# Patient Record
Sex: Female | Born: 1985 | Race: Black or African American | Hispanic: No | Marital: Single | Smoking: Never smoker
Health system: Southern US, Community
[De-identification: ages and names within clinical notes are randomized; demographics above are authoritative.]

## PROBLEM LIST (undated history)

## (undated) ENCOUNTER — Inpatient Hospital Stay (HOSPITAL_COMMUNITY): Payer: Self-pay

## (undated) ENCOUNTER — Emergency Department (HOSPITAL_COMMUNITY): Admission: EM | Payer: Self-pay

## (undated) DIAGNOSIS — A549 Gonococcal infection, unspecified: Secondary | ICD-10-CM

## (undated) DIAGNOSIS — A749 Chlamydial infection, unspecified: Secondary | ICD-10-CM

## (undated) DIAGNOSIS — I10 Essential (primary) hypertension: Secondary | ICD-10-CM

## (undated) DIAGNOSIS — R87629 Unspecified abnormal cytological findings in specimens from vagina: Secondary | ICD-10-CM

## (undated) DIAGNOSIS — A599 Trichomoniasis, unspecified: Secondary | ICD-10-CM

## (undated) DIAGNOSIS — B999 Unspecified infectious disease: Secondary | ICD-10-CM

## (undated) DIAGNOSIS — O139 Gestational [pregnancy-induced] hypertension without significant proteinuria, unspecified trimester: Secondary | ICD-10-CM

## (undated) HISTORY — PX: OTHER SURGICAL HISTORY: SHX169

## (undated) HISTORY — PX: EYE SURGERY: SHX253

## (undated) HISTORY — PX: DILATION AND CURETTAGE OF UTERUS: SHX78

---

## 2004-01-24 ENCOUNTER — Emergency Department (HOSPITAL_COMMUNITY): Admission: EM | Admit: 2004-01-24 | Discharge: 2004-01-24 | Payer: Self-pay | Admitting: Emergency Medicine

## 2005-06-16 ENCOUNTER — Inpatient Hospital Stay (HOSPITAL_COMMUNITY): Admission: AD | Admit: 2005-06-16 | Discharge: 2005-06-16 | Payer: Self-pay | Admitting: Obstetrics & Gynecology

## 2005-06-25 ENCOUNTER — Inpatient Hospital Stay (HOSPITAL_COMMUNITY): Admission: AD | Admit: 2005-06-25 | Discharge: 2005-06-25 | Payer: Self-pay | Admitting: Obstetrics

## 2005-06-27 ENCOUNTER — Inpatient Hospital Stay (HOSPITAL_COMMUNITY): Admission: AD | Admit: 2005-06-27 | Discharge: 2005-06-27 | Payer: Self-pay | Admitting: Obstetrics & Gynecology

## 2005-06-30 ENCOUNTER — Inpatient Hospital Stay (HOSPITAL_COMMUNITY): Admission: AD | Admit: 2005-06-30 | Discharge: 2005-06-30 | Payer: Self-pay | Admitting: Obstetrics

## 2005-07-03 ENCOUNTER — Inpatient Hospital Stay (HOSPITAL_COMMUNITY): Admission: AD | Admit: 2005-07-03 | Discharge: 2005-07-06 | Payer: Self-pay | Admitting: Obstetrics

## 2007-01-04 ENCOUNTER — Emergency Department (HOSPITAL_COMMUNITY): Admission: EM | Admit: 2007-01-04 | Discharge: 2007-01-04 | Payer: Self-pay | Admitting: Family Medicine

## 2007-09-20 ENCOUNTER — Inpatient Hospital Stay (HOSPITAL_COMMUNITY): Admission: AD | Admit: 2007-09-20 | Discharge: 2007-09-20 | Payer: Self-pay | Admitting: Obstetrics & Gynecology

## 2008-04-27 ENCOUNTER — Inpatient Hospital Stay (HOSPITAL_COMMUNITY): Admission: AD | Admit: 2008-04-27 | Discharge: 2008-04-27 | Payer: Self-pay | Admitting: Obstetrics & Gynecology

## 2008-07-19 ENCOUNTER — Emergency Department (HOSPITAL_COMMUNITY): Admission: EM | Admit: 2008-07-19 | Discharge: 2008-07-20 | Payer: Self-pay | Admitting: Emergency Medicine

## 2009-05-08 ENCOUNTER — Emergency Department (HOSPITAL_COMMUNITY): Admission: EM | Admit: 2009-05-08 | Discharge: 2009-05-08 | Payer: Self-pay | Admitting: Family Medicine

## 2009-07-12 ENCOUNTER — Emergency Department (HOSPITAL_COMMUNITY): Admission: EM | Admit: 2009-07-12 | Discharge: 2009-07-12 | Payer: Self-pay | Admitting: Emergency Medicine

## 2009-09-04 ENCOUNTER — Emergency Department (HOSPITAL_COMMUNITY): Admission: EM | Admit: 2009-09-04 | Discharge: 2009-09-04 | Payer: Self-pay | Admitting: Emergency Medicine

## 2009-11-07 ENCOUNTER — Emergency Department (HOSPITAL_COMMUNITY): Admission: EM | Admit: 2009-11-07 | Discharge: 2009-11-07 | Payer: Self-pay | Admitting: Emergency Medicine

## 2010-01-07 ENCOUNTER — Inpatient Hospital Stay (HOSPITAL_COMMUNITY): Admission: AD | Admit: 2010-01-07 | Discharge: 2010-01-07 | Payer: Self-pay | Admitting: Obstetrics & Gynecology

## 2010-12-24 LAB — URINALYSIS, ROUTINE W REFLEX MICROSCOPIC
Bilirubin Urine: NEGATIVE
Ketones, ur: NEGATIVE mg/dL
Leukocytes, UA: NEGATIVE
Protein, ur: NEGATIVE mg/dL
Urobilinogen, UA: 1 mg/dL (ref 0.0–1.0)

## 2010-12-24 LAB — POCT I-STAT, CHEM 8
Calcium, Ion: 1.1 mmol/L — ABNORMAL LOW (ref 1.12–1.32)
Chloride: 106 mEq/L (ref 96–112)
Creatinine, Ser: 1.1 mg/dL (ref 0.4–1.2)
HCT: 35 % — ABNORMAL LOW (ref 36.0–46.0)
Sodium: 141 mEq/L (ref 135–145)

## 2010-12-24 LAB — URINE MICROSCOPIC-ADD ON

## 2010-12-24 LAB — CBC
HCT: 33.6 % — ABNORMAL LOW (ref 36.0–46.0)
Hemoglobin: 11.4 g/dL — ABNORMAL LOW (ref 12.0–15.0)
MCHC: 32.5 g/dL (ref 30.0–36.0)
MCHC: 34 g/dL (ref 30.0–36.0)
Platelets: 274 10*3/uL (ref 150–400)
RBC: 3.9 MIL/uL (ref 3.87–5.11)
RDW: 13.5 % (ref 11.5–15.5)
RDW: 14 % (ref 11.5–15.5)
WBC: 7.8 10*3/uL (ref 4.0–10.5)

## 2010-12-24 LAB — WET PREP, GENITAL
Clue Cells Wet Prep HPF POC: NONE SEEN
Trich, Wet Prep: NONE SEEN
WBC, Wet Prep HPF POC: NONE SEEN
Yeast Wet Prep HPF POC: NONE SEEN

## 2010-12-24 LAB — DIFFERENTIAL
Eosinophils Absolute: 0.9 10*3/uL — ABNORMAL HIGH (ref 0.0–0.7)
Eosinophils Relative: 12 % — ABNORMAL HIGH (ref 0–5)
Lymphocytes Relative: 41 % (ref 12–46)
Lymphs Abs: 3.2 10*3/uL (ref 0.7–4.0)
Monocytes Absolute: 0.5 10*3/uL (ref 0.1–1.0)
Monocytes Relative: 6 % (ref 3–12)
Neutro Abs: 3.1 10*3/uL (ref 1.7–7.7)
Neutrophils Relative %: 40 % — ABNORMAL LOW (ref 43–77)

## 2010-12-24 LAB — GC/CHLAMYDIA PROBE AMP, GENITAL
Chlamydia, DNA Probe: NEGATIVE
Chlamydia, DNA Probe: NEGATIVE

## 2010-12-24 LAB — POCT PREGNANCY, URINE: Preg Test, Ur: NEGATIVE

## 2010-12-24 LAB — PREGNANCY, URINE: Preg Test, Ur: NEGATIVE

## 2011-01-06 LAB — WET PREP, GENITAL: Yeast Wet Prep HPF POC: NONE SEEN

## 2011-01-06 LAB — URINE CULTURE

## 2011-01-06 LAB — POCT PREGNANCY, URINE: Preg Test, Ur: NEGATIVE

## 2011-01-06 LAB — POCT URINALYSIS DIP (DEVICE)
Protein, ur: 30 mg/dL — AB
Urobilinogen, UA: 2 mg/dL — ABNORMAL HIGH (ref 0.0–1.0)
pH: 6.5 (ref 5.0–8.0)

## 2011-01-06 LAB — GC/CHLAMYDIA PROBE AMP, GENITAL
Chlamydia, DNA Probe: NEGATIVE
GC Probe Amp, Genital: NEGATIVE

## 2011-01-08 LAB — WET PREP, GENITAL
Trich, Wet Prep: NONE SEEN
Yeast Wet Prep HPF POC: NONE SEEN

## 2011-01-08 LAB — GC/CHLAMYDIA PROBE AMP, GENITAL: Chlamydia, DNA Probe: NEGATIVE

## 2011-01-08 LAB — RPR: RPR Ser Ql: NONREACTIVE

## 2011-01-10 LAB — WET PREP, GENITAL: Trich, Wet Prep: NONE SEEN

## 2011-01-10 LAB — POCT PREGNANCY, URINE: Preg Test, Ur: NEGATIVE

## 2011-01-10 LAB — POCT URINALYSIS DIP (DEVICE)
Ketones, ur: NEGATIVE mg/dL
Nitrite: NEGATIVE

## 2011-03-30 ENCOUNTER — Inpatient Hospital Stay (HOSPITAL_COMMUNITY)
Admission: AD | Admit: 2011-03-30 | Discharge: 2011-03-31 | Disposition: A | Discharge status: Home / Self Care | Payer: Self-pay | Source: Ambulatory Visit | Attending: Obstetrics & Gynecology | Admitting: Obstetrics & Gynecology

## 2011-03-30 DIAGNOSIS — I10 Essential (primary) hypertension: Secondary | ICD-10-CM | POA: Insufficient documentation

## 2011-03-30 DIAGNOSIS — R079 Chest pain, unspecified: Secondary | ICD-10-CM | POA: Insufficient documentation

## 2011-03-30 LAB — POCT PREGNANCY, URINE: Preg Test, Ur: NEGATIVE

## 2011-03-31 LAB — CBC
HCT: 37.1 % (ref 36.0–46.0)
Hemoglobin: 12.1 g/dL (ref 12.0–15.0)
MCHC: 32.6 g/dL (ref 30.0–36.0)
RBC: 4.36 MIL/uL (ref 3.87–5.11)

## 2011-05-02 ENCOUNTER — Emergency Department (HOSPITAL_COMMUNITY): Payer: Self-pay

## 2011-05-02 ENCOUNTER — Encounter (HOSPITAL_COMMUNITY): Payer: Self-pay | Admitting: Radiology

## 2011-05-02 ENCOUNTER — Emergency Department (HOSPITAL_COMMUNITY)
Admission: EM | Admit: 2011-05-02 | Discharge: 2011-05-03 | Disposition: A | Discharge status: Home / Self Care | Payer: Self-pay | Attending: Emergency Medicine | Admitting: Emergency Medicine

## 2011-05-02 DIAGNOSIS — R0789 Other chest pain: Secondary | ICD-10-CM | POA: Insufficient documentation

## 2011-05-02 DIAGNOSIS — E669 Obesity, unspecified: Secondary | ICD-10-CM | POA: Insufficient documentation

## 2011-05-02 DIAGNOSIS — I10 Essential (primary) hypertension: Secondary | ICD-10-CM | POA: Insufficient documentation

## 2011-05-02 DIAGNOSIS — J45909 Unspecified asthma, uncomplicated: Secondary | ICD-10-CM | POA: Insufficient documentation

## 2011-05-02 DIAGNOSIS — I498 Other specified cardiac arrhythmias: Secondary | ICD-10-CM | POA: Insufficient documentation

## 2011-05-02 HISTORY — DX: Essential (primary) hypertension: I10

## 2011-05-02 LAB — COMPREHENSIVE METABOLIC PANEL
ALT: 44 U/L — ABNORMAL HIGH (ref 0–35)
AST: 27 U/L (ref 0–37)
Alkaline Phosphatase: 59 U/L (ref 39–117)
CO2: 28 mEq/L (ref 19–32)
Chloride: 103 mEq/L (ref 96–112)
GFR calc Af Amer: >60 mL/min (ref >60)
GFR calc non Af Amer: >60 mL/min (ref >60)
Glucose, Bld: 82 mg/dL (ref 70–99)
Potassium: 4 mEq/L (ref 3.5–5.1)
Sodium: 140 mEq/L (ref 135–145)
Total Bilirubin: 0.4 mg/dL (ref 0.3–1.2)

## 2011-05-02 LAB — DIFFERENTIAL
Basophils Relative: 0 % (ref 0–1)
Lymphs Abs: 3.9 10*3/uL (ref 0.7–4.0)
Monocytes Absolute: 0.7 10*3/uL (ref 0.1–1.0)
Monocytes Relative: 6 % (ref 3–12)
Neutro Abs: 6.5 10*3/uL (ref 1.7–7.7)

## 2011-05-02 LAB — CBC
HCT: 36.3 % (ref 36.0–46.0)
Hemoglobin: 12.3 g/dL (ref 12.0–15.0)
MCH: 27.9 pg (ref 26.0–34.0)
MCHC: 33.9 g/dL (ref 30.0–36.0)
MCV: 82.3 fL (ref 78.0–100.0)
RBC: 4.41 MIL/uL (ref 3.87–5.11)

## 2011-05-02 LAB — CK TOTAL AND CKMB (NOT AT ARMC): Total CK: 257 U/L — ABNORMAL HIGH (ref 7–177)

## 2011-05-02 LAB — TROPONIN I: Troponin I: <0.3 ng/mL (ref <0.30)

## 2011-05-02 MED ORDER — IOHEXOL 300 MG/ML  SOLN
100.0000 mL | Freq: Once | INTRAMUSCULAR | Status: AC | PRN
  Administered 2011-05-02: 100 mL via INTRAVENOUS

## 2011-07-03 LAB — HCG, QUANTITATIVE, PREGNANCY: hCG, Beta Chain, Quant, S: 51714 — ABNORMAL HIGH

## 2011-07-03 LAB — URINALYSIS, ROUTINE W REFLEX MICROSCOPIC
Bilirubin Urine: NEGATIVE
Glucose, UA: NEGATIVE
Protein, ur: NEGATIVE
Urobilinogen, UA: 0.2

## 2011-07-03 LAB — WET PREP, GENITAL: Trich, Wet Prep: NONE SEEN

## 2011-07-03 LAB — POCT PREGNANCY, URINE
Operator id: 208801
Preg Test, Ur: POSITIVE

## 2011-07-03 LAB — URINE CULTURE: Colony Count: 75000

## 2011-07-03 LAB — URINE MICROSCOPIC-ADD ON

## 2011-07-03 LAB — GC/CHLAMYDIA PROBE AMP, GENITAL: Chlamydia, DNA Probe: NEGATIVE

## 2011-07-10 LAB — URINE CULTURE

## 2011-07-10 LAB — CBC
HCT: 34.1 — ABNORMAL LOW
Hemoglobin: 11.6 — ABNORMAL LOW
RBC: 4.07

## 2011-07-10 LAB — POCT PREGNANCY, URINE: Operator id: 242691

## 2011-07-10 LAB — URINALYSIS, ROUTINE W REFLEX MICROSCOPIC
Bilirubin Urine: NEGATIVE
Glucose, UA: NEGATIVE
Ketones, ur: NEGATIVE
Protein, ur: NEGATIVE
pH: 6

## 2011-07-10 LAB — WET PREP, GENITAL

## 2011-07-10 LAB — GC/CHLAMYDIA PROBE AMP, GENITAL
Chlamydia, DNA Probe: NEGATIVE
GC Probe Amp, Genital: NEGATIVE

## 2012-01-26 ENCOUNTER — Encounter (HOSPITAL_COMMUNITY): Payer: Self-pay | Admitting: *Deleted

## 2012-01-26 ENCOUNTER — Inpatient Hospital Stay (HOSPITAL_COMMUNITY)
Admission: AD | Admit: 2012-01-26 | Discharge: 2012-01-26 | Disposition: A | Discharge status: Home / Self Care | Payer: Self-pay | Source: Ambulatory Visit | Attending: Obstetrics & Gynecology | Admitting: Obstetrics & Gynecology

## 2012-01-26 DIAGNOSIS — B9689 Other specified bacterial agents as the cause of diseases classified elsewhere: Secondary | ICD-10-CM | POA: Insufficient documentation

## 2012-01-26 DIAGNOSIS — A499 Bacterial infection, unspecified: Secondary | ICD-10-CM | POA: Insufficient documentation

## 2012-01-26 DIAGNOSIS — N760 Acute vaginitis: Secondary | ICD-10-CM | POA: Insufficient documentation

## 2012-01-26 DIAGNOSIS — I10 Essential (primary) hypertension: Secondary | ICD-10-CM | POA: Insufficient documentation

## 2012-01-26 DIAGNOSIS — N949 Unspecified condition associated with female genital organs and menstrual cycle: Secondary | ICD-10-CM | POA: Insufficient documentation

## 2012-01-26 LAB — WET PREP, GENITAL: Yeast Wet Prep HPF POC: NONE SEEN

## 2012-01-26 MED ORDER — METRONIDAZOLE 500 MG PO TABS: 500.0000 mg | ORAL_TABLET | Freq: Once | ORAL | Status: AC

## 2012-01-26 MED ORDER — HYDROCHLOROTHIAZIDE 25 MG PO TABS: 25.0000 mg | ORAL_TABLET | Freq: Every day | ORAL | Status: DC

## 2012-01-26 MED ORDER — HYDROCHLOROTHIAZIDE 25 MG PO TABS: 12.5000 mg | ORAL_TABLET | Freq: Every day | ORAL | Status: DC

## 2012-01-26 NOTE — MAU Provider Note (Signed)
Emma Contras [place]25 y.o.[id number]  Chief Complaint  Patient presents with  . Vaginal Discharge     None     SUBJECTIVE  HPI: States she had a malodorous white discharge that she thinks is BV, but now still menstruating. Dsicharge does not itch or burn but is more copious than usual. No UTI sx or abd pain.  Denies new sex partner. Wants GC/CT too. Not on contraception. Unasble to afford her HCTZ for [facility] and has no PCP.   Past Medical History  Diagnosis Date  . Hypertension   . Asthma    Past Surgical History  Procedure Date  . Dilation and curettage of uterus    History   Social History  . Marital Status: Single    Spouse Name: N/A    Number of Children: N/A  . Years of Education: N/A   Occupational History  . Not on file.   Social History Main Topics  . Smoking status: Never Smoker   . Smokeless tobacco: Not on file  . Alcohol Use: No  . Drug Use: No  . Sexually Active: Yes    Birth Control/ Protection: None   Other Topics Concern  . Not on file   Social History Narrative  . No narrative on file   No current facility-administered medications on file prior to encounter.   No current outpatient prescriptions on file prior to encounter.   No Known Allergies  ROS: Pertinent items in HPI  OBJECTIVE Blood pressure 149/103, pulse 85, temperature 97.9 F (36.6 C), temperature source Oral, resp. rate 16, height 5\' 5"  (1.651 m), weight 107.502 kg (237 lb), last menstrual period 01/23/2012, SpO2 100.00%. GENERAL: Well-developed, well-nourished female in no acute distress.  ABDOMEN: Soft, nontender EXTREMITIES: Nontender, no edema SPECULUM EXAM: NEFG, scant bloody discharge, cervix slightly red BIMANUAL: cervix no CMT; uterus NSSP; no adnexal tenderness or masses   LAB RESULTS No results found for this or any previous visit (from the past 24 hour(s)). Results for orders placed during the hospital encounter of 01/26/12 (from the past 24 hour(s))  WET PREP,  GENITAL     Status: Abnormal   Collection Time   01/26/12  1:07 PM      Component Value Range   Yeast Wet Prep HPF POC NONE SEEN  NONE SEEN    Trich, Wet Prep NONE SEEN  NONE SEEN    Clue Cells Wet Prep HPF POC FEW (*) NONE SEEN    WBC, Wet Prep HPF POC FEW (*) NONE SEEN    IMAGING   ASSESSMENT BV CHTN Needs contraception  PLAN Rx Flagyl and refilled her HCTZ. Refer to [facility] or [facility] asap for HTN management and to start contraception     Khallid Pasillas 01/26/2012 12:58 PM

## 2012-01-26 NOTE — Discharge Instructions (Signed)
Bacterial Vaginosis Bacterial vaginosis is an infection of the vagina. A healthy vagina has many kinds of good germs (bacteria). Sometimes the number of good germs can change. This allows bad germs to move in and cause an infection. You may be given medicine (antibiotics) to treat the infection. Or, you may not need treatment at all. HOME CARE  Take your medicine as told. Finish them even if you start to feel better.   Do not have sex until you finish your medicine.   Do not douche.   Practice safe sex.   Tell your sex partner that you have an infection. They should see their doctor for treatment if they have problems.  GET HELP RIGHT AWAY IF:  You do not get better after 3 days of treatment.   You have grey fluid (discharge) coming from your vagina.   You have pain.   You have a temperature of 102 F (38.9 C) or higher.  MAKE SURE YOU:   Understand these instructions.   Will watch your condition.   Will get help right away if you are not doing well or get worse.  Document Released: 06/30/2008 Document Revised: 09/10/2011 Document Reviewed: 06/30/2008 [employer] Patient Information 2012 [employer]. 

## 2012-01-26 NOTE — MAU Note (Signed)
Pt in c/o vaginal discharge with an abnormal odor.  Discharge started last week.  Believes it is bacterial vaginosis. Denies any pain.  Is currently on menstrual cycle.

## 2012-01-26 NOTE — MAU Note (Signed)
Patient states she has had a vaginal discharge with a slight odor for about one week. Has recurrent BV. No pain, currently on cycle with slight bleeding.

## 2012-03-19 ENCOUNTER — Emergency Department (HOSPITAL_COMMUNITY)
Admission: EM | Admit: 2012-03-19 | Discharge: 2012-03-19 | Disposition: A | Discharge status: Home / Self Care | Payer: Self-pay | Source: Home / Self Care | Attending: Emergency Medicine | Admitting: Emergency Medicine

## 2012-03-19 ENCOUNTER — Encounter (HOSPITAL_COMMUNITY): Payer: Self-pay | Admitting: *Deleted

## 2012-03-19 DIAGNOSIS — A499 Bacterial infection, unspecified: Secondary | ICD-10-CM

## 2012-03-19 DIAGNOSIS — B9689 Other specified bacterial agents as the cause of diseases classified elsewhere: Secondary | ICD-10-CM

## 2012-03-19 DIAGNOSIS — N760 Acute vaginitis: Secondary | ICD-10-CM

## 2012-03-19 DIAGNOSIS — Z3480 Encounter for supervision of other normal pregnancy, unspecified trimester: Secondary | ICD-10-CM

## 2012-03-19 LAB — POCT URINALYSIS DIP (DEVICE)
Ketones, ur: NEGATIVE mg/dL
Leukocytes, UA: NEGATIVE
Nitrite: NEGATIVE
Protein, ur: NEGATIVE mg/dL
Urobilinogen, UA: 0.2 mg/dL (ref 0.0–1.0)
pH: 6 (ref 5.0–8.0)

## 2012-03-19 LAB — WET PREP, GENITAL
Trich, Wet Prep: NONE SEEN
Yeast Wet Prep HPF POC: NONE SEEN

## 2012-03-19 MED ORDER — PRENATAL VITAMINS 28-0.8 MG PO TABS: 1.0000 | ORAL_TABLET | Freq: Every day | ORAL | Status: DC

## 2012-03-19 MED ORDER — METRONIDAZOLE 500 MG PO TABS: 500.0000 mg | ORAL_TABLET | Freq: Two times a day (BID) | ORAL | Status: AC

## 2012-03-19 NOTE — ED Provider Notes (Signed)
Chief Complaint  Patient presents with  . Vaginitis  . Dysuria    History of Present Illness:   The patient is a 26 year old female who has had a six-day history of a white discharge and older. She denies any itching or irritation. She has no vulvar, vaginal, or pelvic pain. She denies any fever, chills, nausea, or vomiting. She has somewhat irregular menses. Last menstrual period was May 9. She is sexually active and does not use any birth control. She has no symptoms of early pregnancy including morning sickness, breast swelling, or tenderness. She does have a history of bacterial vaginosis.  Review of Systems:  Other than noted above, the patient denies any of the following symptoms: Systemic:  No fever, chills, sweats, fatigue, or weight loss. GI:  No abdominal pain, nausea, anorexia, vomiting, diarrhea, constipation, melena or hematochezia. GU:  No dysuria, frequency, urgency, hematuria, vaginal discharge, itching, or abnormal vaginal bleeding. Skin:  No rash or itching.   PMFSH:  Past medical history, family history, social history, meds, and allergies were reviewed.  Physical Exam:   Vital signs:  BP 145/95  Pulse 98  Temp 98.5 F (36.9 C) (Oral)  Resp 18  SpO2 100%  LMP 02/11/2012 General:  Alert, oriented and in no distress. Lungs:  Breath sounds clear and equal bilaterally.  No wheezes, rales or rhonchi. Heart:  Regular rhythm.  No gallops or murmers. Abdomen:  Soft, flat and non-distended.  No organomegaly or mass.  No tenderness, guarding or rebound.  Bowel sounds normally active. Pelvic exam:  Normal external genitalia, vaginal mucosa was normal. There was a moderate amount of white discharge. Cervix appears normal. Uterus is mid position, normal in size and shape and nontender. No adnexal masses or tenderness. Skin:  Clear, warm and dry.  Labs:   Results for orders placed during the hospital encounter of 03/19/12  WET PREP, GENITAL      Component Value Range   Yeast  Wet Prep HPF POC NONE SEEN  NONE SEEN   Trich, Wet Prep NONE SEEN  NONE SEEN   Clue Cells Wet Prep HPF POC MANY (*) NONE SEEN   WBC, Wet Prep HPF POC MANY (*) NONE SEEN  POCT URINALYSIS DIP (DEVICE)      Component Value Range   Glucose, UA NEGATIVE  NEGATIVE mg/dL   Bilirubin Urine NEGATIVE  NEGATIVE   Ketones, ur NEGATIVE  NEGATIVE mg/dL   Specific Gravity, Urine 1.025  1.005 - 1.030   Hgb urine dipstick TRACE (*) NEGATIVE   pH 6.0  5.0 - 8.0   Protein, ur NEGATIVE  NEGATIVE mg/dL   Urobilinogen, UA 0.2  0.0 - 1.0 mg/dL   Nitrite NEGATIVE  NEGATIVE   Leukocytes, UA NEGATIVE  NEGATIVE  POCT PREGNANCY, URINE      Component Value Range   Preg Test, Ur POSITIVE (*) NEGATIVE     Assessment:  The primary encounter diagnosis was Bacterial vaginosis. A diagnosis of Normal pregnancy, repeat was also pertinent to this visit.  Plan:   1.  The following meds were prescribed:   New Prescriptions   METRONIDAZOLE (FLAGYL) 500 MG TABLET    Take 1 tablet (500 mg total) by mouth 2 (two) times daily.   PRENATAL VIT-FE FUMARATE-FA (PRENATAL VITAMINS) 28-0.8 MG TABS    Take 1 tablet by mouth daily.   2.  The patient was instructed in symptomatic care and handouts were given. 3.  The patient was told to return if becoming worse in any  way, if no better in 3 or 4 days, and given some red flag symptoms that would indicate earlier return.  Follow up:  The patient was told to follow up at [facility] hospital clinics for routine prenatal care. The patient was warned not to use alcohol or any other nonprescription medications for right now.    Reuben Likes, MD 03/19/12 [phone]

## 2012-03-19 NOTE — Discharge Instructions (Signed)
To restore the normal balance of "good bacteria" in your system.  Take a probiotic once daily.  These can be gotten over the counter at the drug store without a prescription and come under various brand names such as Culturelle, Align, Florastore, and Phillips.  The best thing to do is to ask your pharmacist to recommend a good probiotic that is not too expensive.    Bacterial Vaginosis Bacterial vaginosis (BV) is a vaginal infection where the normal balance of bacteria in the vagina is disrupted. The normal balance is then replaced by an overgrowth of certain bacteria. There are several different kinds of bacteria that can cause BV. BV is the most common vaginal infection in women of childbearing age. CAUSES   The cause of BV is not fully understood. BV develops when there is an increase or imbalance of harmful bacteria.   Some activities or behaviors can upset the normal balance of bacteria in the vagina and put women at increased risk including:   Having a new sex partner or multiple sex partners.   Douching.   Using an intrauterine device (IUD) for contraception.   It is not clear what role sexual activity plays in the development of BV. However, women that have never had sexual intercourse are rarely infected with BV.  Women do not get BV from toilet seats, bedding, swimming pools or from touching objects around them.  SYMPTOMS   Grey vaginal discharge.   A fish-like odor with discharge, especially after sexual intercourse.   Itching or burning of the vagina and vulva.   Burning or pain with urination.   Some women have no signs or symptoms at all.  DIAGNOSIS  Your caregiver must examine the vagina for signs of BV. Your caregiver will perform lab tests and look at the sample of vaginal fluid through a microscope. They will look for bacteria and abnormal cells (clue cells), a pH test higher than 4.5, and a positive amine test all associated with BV.  RISKS AND COMPLICATIONS    Pelvic inflammatory disease (PID).   Infections following gynecology surgery.   Developing HIV.   Developing herpes virus.  TREATMENT  Sometimes BV will clear up without treatment. However, all women with symptoms of BV should be treated to avoid complications, especially if gynecology surgery is planned. Female partners generally do not need to be treated. However, BV may spread between female sex partners so treatment is helpful in preventing a recurrence of BV.   BV may be treated with antibiotics. The antibiotics come in either pill or vaginal cream forms. Either can be used with nonpregnant or pregnant women, but the recommended dosages differ. These antibiotics are not harmful to the baby.   BV can recur after treatment. If this happens, a second round of antibiotics will often be prescribed.   Treatment is important for pregnant women. If not treated, BV can cause a premature delivery, especially for a pregnant woman who had a premature birth in the past. All pregnant women who have symptoms of BV should be checked and treated.   For chronic reoccurrence of BV, treatment with a type of prescribed gel vaginally twice a week is helpful.  HOME CARE INSTRUCTIONS   Finish all medication as directed by your caregiver.   Do not have sex until treatment is completed.   Tell your sexual partner that you have a vaginal infection. They should see their caregiver and be treated if they have problems, such as a mild rash or itching.     Practice safe sex. Use condoms. Only have 1 sex partner.  PREVENTION  Basic prevention steps can help reduce the risk of upsetting the natural balance of bacteria in the vagina and developing BV:  Do not have sexual intercourse (be abstinent).   Do not douche.   Use all of the medicine prescribed for treatment of BV, even if the signs and symptoms go away.   Tell your sex partner if you have BV. That way, they can be treated, if needed, to prevent  reoccurrence.  SEEK MEDICAL CARE IF:   Your symptoms are not improving after 3 days of treatment.   You have increased discharge, pain, or fever.  MAKE SURE YOU:   Understand these instructions.   Will watch your condition.   Will get help right away if you are not doing well or get worse.  FOR MORE INFORMATION  Division of STD Prevention (DSTDP), Centers for Disease Control and Prevention: SolutionApps.co.za American Social Health Association (ASHA): www.ashastd.org  Document Released: 09/21/2005 Document Revised: 09/10/2011 Document Reviewed: 03/14/2009 [facility] Patient Information 2012 [place], [place].ABCs of Pregnancy A Antepartum care is very important. Be sure you see your doctor and get prenatal care as soon as you think you are pregnant. At this time, you will be tested for infection, genetic abnormalities and potential problems with you and the pregnancy. This is the time to discuss diet, exercise, work, medications, labor, pain medication during labor and the possibility of a cesarean delivery. Ask any questions that may concern you. It is important to see your doctor regularly throughout your pregnancy. Avoid exposure to toxic substances and chemicals - such as cleaning solvents, lead and mercury, some insecticides, and paint. Pregnant women should avoid exposure to paint fumes, and fumes that cause you to feel ill, dizzy or faint. When possible, it is a good idea to have a pre-pregnancy consultation with your caregiver to begin some important recommendations your caregiver suggests such as, taking folic acid, exercising, quitting smoking, avoiding alcoholic beverages, etc. B Breastfeeding is the healthiest choice for both you and your baby. It has many nutritional benefits for the baby and health benefits for the mother. It also creates a very tight and loving bond between the baby and mother. Talk to your doctor, your family and friends, and your employer about how you choose to feed  your baby and how they can support you in your decision. Not all birth defects can be prevented, but a woman can take actions that may increase her chance of having a healthy baby. Many birth defects happen very early in pregnancy, sometimes before a woman even knows she is pregnant. Birth defects or abnormalities of any child in your or the father's family should be discussed with your caregiver. Get a good support bra as your breast size changes. Wear it especially when you exercise and when nursing.  C Celebrate the news of your pregnancy with the your spouse/father and family. Childbirth classes are helpful to take for you and the spouse/father because it helps to understand what happens during the pregnancy, labor and delivery. Cesarean delivery should be discussed with your doctor so you are prepared for that possibility. The pros and cons of circumcision if it is a boy, should be discussed with your pediatrician. Cigarette smoking during pregnancy can result in low birth weight babies. It has been associated with infertility, miscarriages, tubal pregnancies, infant death (mortality) and poor health (morbidity) in childhood. Additionally, cigarette smoking may cause long-term learning disabilities. If you smoke,  you should try to quit before getting pregnant and not smoke during the pregnancy. Secondary smoke may also harm a mother and her developing baby. It is a good idea to ask people to stop smoking around you during your pregnancy and after the baby is born. Extra calcium is necessary when you are pregnant and is found in your prenatal vitamin, in dairy products, green leafy vegetables and in calcium supplements. D A healthy diet according to your current weight and height, along with vitamins and mineral supplements should be discussed with your caregiver. Domestic abuse or violence should be made known to your doctor right away to get the situation corrected. Drink more water when you exercise to  keep hydrated. Discomfort of your back and legs usually develops and progresses from the middle of the second trimester through to delivery of the baby. This is because of the enlarging baby and uterus, which may also affect your balance. Do not take illegal drugs. Illegal drugs can seriously harm the baby and you. Drink extra fluids (water is best) throughout pregnancy to help your body keep up with the increases in your blood volume. Drink at least 6 to 8 glasses of water, fruit juice, or milk each day. A good way to know you are drinking enough fluid is when your urine looks almost like clear water or is very light yellow.  E Eat healthy to get the nutrients you and your unborn baby need. Your meals should include the five basic food groups. Exercise (30 minutes of light to moderate exercise a day) is important and encouraged during pregnancy, if there are no medical problems or problems with the pregnancy. Exercise that causes discomfort or dizziness should be stopped and reported to your caregiver. Emotions during pregnancy can change from being ecstatic to depression and should be understood by you, your partner and your family. F Fetal screening with ultrasound, amniocentesis and monitoring during pregnancy and labor is common and sometimes necessary. Take 400 micrograms of folic acid daily both before, when possible, and during the first few months of pregnancy to reduce the risk of birth defects of the brain and spine. All women who could possibly become pregnant should take a vitamin with folic acid, every day. It is also important to eat a healthy diet with fortified foods (enriched grain products, including cereals, rice, breads, and pastas) and foods with natural sources of folate (orange juice, green leafy vegetables, beans, peanuts, broccoli, asparagus, peas, and lentils). The father should be involved with all aspects of the pregnancy including, the prenatal care, childbirth classes, labor,  delivery, and postpartum time. Fathers may also have emotional concerns about being a father, financial needs, and raising a family. G Genetic testing should be done appropriately. It is important to know your family and the father's history. If there have been problems with pregnancies or birth defects in your family, report these to your doctor. Also, genetic counselors can talk with you about the information you might need in making decisions about having a family. You can call a major medical center in your area for help in finding a board-certified genetic counselor. Genetic testing and counseling should be done before pregnancy when possible, especially if there is a history of problems in the mother's or father's family. Certain ethnic backgrounds are more at risk for genetic defects. H Get familiar with the hospital where you will be having your baby. Get to know how long it takes to get there, the labor and delivery area,  and the hospital procedures. Be sure your medical insurance is accepted there. Get your home ready for the baby including, clothes, the baby's room (when possible), furniture and car seat. Hand washing is important throughout the day, especially after handling raw meat and poultry, changing the baby's diaper or using the bathroom. This can help prevent the spread of many bacteria and viruses that cause infection. Your hair may become dry and thinner, but will return to normal a few weeks after the baby is born. Heartburn is a common problem that can be treated by taking antacids recommended by your caregiver, eating smaller meals 5 or 6 times a day, not drinking liquids when eating, drinking between meals and raising the head of your bed 2 to 3 inches. I Insurance to cover you, the baby, doctor and hospital should be reviewed so that you will be prepared to pay any costs not covered by your insurance plan. If you do not have medical insurance, there are usually clinics and services  available for you in your community. Take 30 milligrams of iron during your pregnancy as prescribed by your doctor to reduce the risk of low red blood cells (anemia) later in pregnancy. All women of childbearing age should eat a diet rich in iron. J There should be a joint effort for the mother, father and any other children to adapt to the pregnancy financially, emotionally, and psychologically during the pregnancy. Join a support group for moms-to-be. Or, join a class on parenting or childbirth. Have the family participate when possible. K Know your limits. Let your caregiver know if you experience any of the following:   Pain of any kind.   Strong cramps.   You develop a lot of weight in a short period of time (5 pounds in 3 to 5 days).   Vaginal bleeding, leaking of amniotic fluid.   Headache, vision problems.   Dizziness, fainting, shortness of breath.   Chest pain.   Fever of 102 F (38.9 C) or higher.   Gush of clear fluid from your vagina.   Painful urination.   Domestic violence.   Irregular heartbeat (palpitations).   Rapid beating of the heart (tachycardia).   Constant feeling sick to your stomach (nauseous) and vomiting.   Trouble walking, fluid retention (edema).   Muscle weakness.   If your baby has decreased activity.   Persistent diarrhea.   Abnormal vaginal discharge.   Uterine contractions at 20-minute intervals.   Back pain that travels down your leg.  L Learn and practice that what you eat and drink should be in moderation and healthy for you and your baby. Legal drugs such as alcohol and caffeine are important issues for pregnant women. There is no safe amount of alcohol a woman can drink while pregnant. Fetal alcohol syndrome, a disorder characterized by growth retardation, facial abnormalities, and central nervous system dysfunction, is caused by a woman's use of alcohol during pregnancy. Caffeine, found in tea, coffee, soft drinks and  chocolate, should also be limited. Be sure to read labels when trying to cut down on caffeine during pregnancy. More than 200 foods, beverages, and over-the-counter medications contain caffeine and have a high salt content! There are coffees and teas that do not contain caffeine. M Medical conditions such as diabetes, epilepsy, and high blood pressure should be treated and kept under control before pregnancy when possible, but especially during pregnancy. Ask your caregiver about any medications that may need to be changed or adjusted during pregnancy. If  you are currently taking any medications, ask your caregiver if it is safe to take them while you are pregnant or before getting pregnant when possible. Also, be sure to discuss any herbs or vitamins you are taking. They are medicines, too! Discuss with your doctor all medications, prescribed and over-the-counter, that you are taking. During your prenatal visit, discuss the medications your doctor may give you during labor and delivery. N Never be afraid to ask your doctor or caregiver questions about your health, the progress of the pregnancy, family problems, stressful situations, and recommendation for a pediatrician, if you do not have one. It is better to take all precautions and discuss any questions or concerns you may have during your office visits. It is a good idea to write down your questions before you visit the doctor. O Over-the-counter cough and cold remedies may contain alcohol or other ingredients that should be avoided during pregnancy. Ask your caregiver about prescription, herbs or over-the-counter medications that you are taking or may consider taking while pregnant.  P Physical activity during pregnancy can benefit both you and your baby by lessening discomfort and fatigue, providing a sense of well-being, and increasing the likelihood of early recovery after delivery. Light to moderate exercise during pregnancy strengthens the belly  (abdominal) and back muscles. This helps improve posture. Practicing yoga, walking, swimming, and cycling on a stationary bicycle are usually safe exercises for pregnant women. Avoid scuba diving, exercise at high altitudes (over 3000 feet), skiing, horseback riding, contact sports, etc. Always check with your doctor before beginning any kind of exercise, especially during pregnancy and especially if you did not exercise before getting pregnant. Q Queasiness, stomach upset and morning sickness are common during pregnancy. Eating a couple of crackers or dry toast before getting out of bed. Foods that you normally love may make you feel sick to your stomach. You may need to substitute other nutritious foods. Eating 5 or 6 small meals a day instead of 3 large ones may make you feel better. Do not drink with your meals, drink between meals. Questions that you have should be written down and asked during your prenatal visits. R Read about and make plans to baby-proof your home. There are important tips for making your home a safer environment for your baby. Review the tips and make your home safer for you and your baby. Read food labels regarding calories, salt and fat content in the food. S Saunas, hot tubs, and steam rooms should be avoided while you are pregnant. Excessive high heat may be harmful during your pregnancy. Your caregiver will screen and examine you for sexually transmitted diseases and genetic disorders during your prenatal visits. Learn the signs of labor. Sexual relations while pregnant is safe unless there is a medical or pregnancy problem and your caregiver advises against it. T Traveling long distances should be avoided especially in the third trimester of your pregnancy. If you do have to travel out of state, be sure to take a copy of your medical records and medical insurance plan with you. You should not travel long distances without seeing your doctor first. Most airlines will not allow  you to travel after 36 weeks of pregnancy. Toxoplasmosis is an infection caused by a parasite that can seriously harm an unborn baby. Avoid eating undercooked meat and handling cat litter. Be sure to wear gloves when gardening. Tingling of the hands and fingers is not unusual and is due to fluid retention. This will go away after  the baby is born. U Womb (uterus) size increases during the first trimester. Your kidneys will begin to function more efficiently. This may cause you to feel the need to urinate more often. You may also leak urine when sneezing, coughing or laughing. This is due to the growing uterus pressing against your bladder, which lies directly in front of and slightly under the uterus during the first few months of pregnancy. If you experience burning along with frequency of urination or bloody urine, be sure to tell your doctor. The size of your uterus in the third trimester may cause a problem with your balance. It is advisable to maintain good posture and avoid wearing high heels during this time. An ultrasound of your baby may be necessary during your pregnancy and is safe for you and your baby. V Vaccinations are an important concern for pregnant women. Get needed vaccines before pregnancy. Center for Disease Control (FootballExhibition.com.br) has clear guidelines for the use of vaccines during pregnancy. Review the list, be sure to discuss it with your doctor. Prenatal vitamins are helpful and healthy for you and the baby. Do not take extra vitamins except what is recommended. Taking too much of certain vitamins can cause overdose problems. Continuous vomiting should be reported to your caregiver. Varicose veins may appear especially if there is a family history of varicose veins. They should subside after the delivery of the baby. Support hose helps if there is leg discomfort. W Being overweight or underweight during pregnancy may cause problems. Try to get within 15 pounds of your ideal weight  before pregnancy. Remember, pregnancy is not a time to be dieting! Do not stop eating or start skipping meals as your weight increases. Both you and your baby need the calories and nutrition you receive from a healthy diet. Be sure to consult with your doctor about your diet. There is a formula and diet plan available depending on whether you are overweight or underweight. Your caregiver or nutritionist can help and advise you if necessary. X Avoid X-rays. If you must have dental work or diagnostic tests, tell your dentist or physician that you are pregnant so that extra care can be taken. X-rays should only be taken when the risks of not taking them outweigh the risk of taking them. If needed, only the minimum amount of radiation should be used. When X-rays are necessary, protective lead shields should be used to cover areas of the body that are not being X-rayed. Y Your baby loves you. Breastfeeding your baby creates a loving and very close bond between the two of you. Give your baby a healthy environment to live in while you are pregnant. Infants and children require constant care and guidance. Their health and safety should be carefully watched at all times. After the baby is born, rest or take a nap when the baby is sleeping. Z Get your ZZZs. Be sure to get plenty of rest. Resting on your side as often as possible, especially on your left side is advised. It provides the best circulation to your baby and helps reduce swelling. Try taking a nap for 30 to 45 minutes in the afternoon when possible. After the baby is born rest or take a nap when the baby is sleeping. Try elevating your feet for that amount of time when possible. It helps the circulation in your legs and helps reduce swelling.  Most information courtesy of the CDC. Document Released: 09/21/2005 Document Revised: 09/10/2011 Document Reviewed: 06/05/2009 [employer] Patient Information 2012  [employer].

## 2012-03-19 NOTE — ED Notes (Signed)
Pt with c/o vaginal odor denies discharge bacterial vaginosis x 2 months ago - c/o discomfort with urination onset of symptoms Monday

## 2012-03-21 ENCOUNTER — Telehealth (HOSPITAL_COMMUNITY): Payer: Self-pay | Admitting: *Deleted

## 2012-03-21 LAB — GC/CHLAMYDIA PROBE AMP, GENITAL: GC Probe Amp, Genital: NEGATIVE

## 2012-03-21 NOTE — ED Notes (Signed)
Pt. called on VM for her lab results.  I called pt. back.  Pt. verified x 2 and given results.  Pt. told she was adequately treated for bacterial vaginosis with Flagyl. Vassie Moselle 03/21/2012

## 2012-03-21 NOTE — ED Notes (Signed)
GC/Chlamydia neg., Wet prep: as noted below. Pt. adequately treated with Flagyl. Emma Page 03/21/2012

## 2012-06-12 ENCOUNTER — Inpatient Hospital Stay (HOSPITAL_COMMUNITY)
Admission: AD | Admit: 2012-06-12 | Discharge: 2012-06-12 | Disposition: A | Discharge status: Home / Self Care | Payer: Self-pay | Source: Ambulatory Visit | Attending: Obstetrics & Gynecology | Admitting: Obstetrics & Gynecology

## 2012-06-12 ENCOUNTER — Encounter (HOSPITAL_COMMUNITY): Payer: Self-pay | Admitting: *Deleted

## 2012-06-12 DIAGNOSIS — O289 Unspecified abnormal findings on antenatal screening of mother: Secondary | ICD-10-CM | POA: Insufficient documentation

## 2012-06-12 DIAGNOSIS — A499 Bacterial infection, unspecified: Secondary | ICD-10-CM | POA: Insufficient documentation

## 2012-06-12 DIAGNOSIS — B9689 Other specified bacterial agents as the cause of diseases classified elsewhere: Secondary | ICD-10-CM | POA: Insufficient documentation

## 2012-06-12 DIAGNOSIS — N760 Acute vaginitis: Secondary | ICD-10-CM | POA: Insufficient documentation

## 2012-06-12 DIAGNOSIS — N949 Unspecified condition associated with female genital organs and menstrual cycle: Secondary | ICD-10-CM | POA: Insufficient documentation

## 2012-06-12 LAB — URINALYSIS, ROUTINE W REFLEX MICROSCOPIC
Bilirubin Urine: NEGATIVE
Hgb urine dipstick: NEGATIVE
Ketones, ur: NEGATIVE mg/dL
Specific Gravity, Urine: >1.03 — ABNORMAL HIGH (ref 1.005–1.030)
Urobilinogen, UA: 0.2 mg/dL (ref 0.0–1.0)

## 2012-06-12 LAB — WET PREP, GENITAL: Trich, Wet Prep: NONE SEEN

## 2012-06-12 MED ORDER — METRONIDAZOLE 500 MG PO TABS: 500.0000 mg | ORAL_TABLET | Freq: Two times a day (BID) | ORAL | Status: DC

## 2012-06-12 NOTE — MAU Provider Note (Signed)
History     CSN: [id number]  Arrival date and time: 06/12/12 2132   First Provider Initiated Contact with Patient 06/12/12 2320      Chief Complaint  Patient presents with  . Vaginal Discharge   HPI Emma Page is a 26 y.o. female who presents to MAU with vaginal discharge. She is not pregnant. She states the discharge started a few days ago and has a bad odor. She has had BV in the past and states the symptoms are the same. Last sexual intercourse 2 weeks ago. LMP last week. Last pap smear one year ago and was normal at [facility]. She denies pain, nausea, vomiting or other problems. Hx of GC and Chlamydia "years ago". The history was provided by the patient.  OB History    Grav Para Term Preterm Abortions TAB SAB Ect Mult Living   11 3 3  0 8 5 3   3       Past Medical History  Diagnosis Date  . Hypertension   . Asthma     History reviewed. No pertinent past surgical history.  Family History  Problem Relation Age of Onset  . Anesthesia problems Neg Hx     History  Substance Use Topics  . Smoking status: Never Smoker   . Smokeless tobacco: Not on file  . Alcohol Use: Yes     OCCAS    Allergies: No Known Allergies  Prescriptions prior to admission  Medication Sig Dispense Refill  . ALBUTEROL IN Inhale 2 puffs into the lungs daily as needed. For shortness of breath.      . diphenhydrAMINE (SOMINEX) 25 MG tablet Take 25 mg by mouth at bedtime as needed.      . hydrochlorothiazide (HYDRODIURIL) 25 MG tablet Take 0.5 tablets (12.5 mg total) by mouth daily.  30 tablet  3  . loratadine-pseudoephedrine (CLARITIN-D 24-HOUR) 10-240 MG per 24 hr tablet Take 1 tablet by mouth daily as needed. For allergy.      . triamcinolone cream (KENALOG) 0.1 % Apply 1 application topically daily as needed. For eczema.      . hydrochlorothiazide (HYDRODIURIL) 25 MG tablet Take 1 tablet (25 mg total) by mouth daily.  30 tablet  3  . Prenatal Vit-Fe Fumarate-FA (PRENATAL  VITAMINS) 28-0.8 MG TABS Take 1 tablet by mouth daily.  30 tablet  9    ROS: As stated in HPI  Blood pressure 156/111, pulse 108, temperature 98.8 F (37.1 C), temperature source Oral, resp. rate 20, height 5\' 6"  (1.676 m), weight 236 lb (107.049 kg), last menstrual period 06/06/2012, SpO2 100.00%.  Physical Exam  Nursing note and vitals reviewed. Constitutional: She is oriented to person, place, and time. She appears well-developed and well-nourished. No distress.  HENT:  Head: Normocephalic and atraumatic.  Eyes: EOM are normal.  Neck: Neck supple.  Cardiovascular:       tachycardia  Respiratory: Effort normal.  GI: Soft. There is no tenderness.  Genitourinary:       External genitalia without lesions. White discharge vaginal vault. Cervix without inflammation, no CMT, no adnexal tenderness, uterus without palpable enlargement.  Musculoskeletal: Normal range of motion.  Neurological: She is alert and oriented to person, place, and time.  Skin: Skin is warm and dry.  Psychiatric: She has a normal mood and affect. Her behavior is normal. Judgment and thought content normal.   Procedures Results for orders placed during the hospital encounter of 06/12/12 (from the past 24 hour(s))  URINALYSIS, ROUTINE W REFLEX  MICROSCOPIC     Status: Abnormal   Collection Time   06/12/12 10:35 PM      Component Value Range   Color, Urine YELLOW  YELLOW   APPearance CLEAR  CLEAR   Specific Gravity, Urine >1.030 (*) 1.005 - 1.030   pH 5.5  5.0 - 8.0   Glucose, UA NEGATIVE  NEGATIVE mg/dL   Hgb urine dipstick NEGATIVE  NEGATIVE   Bilirubin Urine NEGATIVE  NEGATIVE   Ketones, ur NEGATIVE  NEGATIVE mg/dL   Protein, ur NEGATIVE  NEGATIVE mg/dL   Urobilinogen, UA 0.2  0.0 - 1.0 mg/dL   Nitrite NEGATIVE  NEGATIVE   Leukocytes, UA NEGATIVE  NEGATIVE  POCT PREGNANCY, URINE     Status: Normal   Collection Time   06/12/12 10:53 PM      Component Value Range   Preg Test, Ur NEGATIVE  NEGATIVE  WET  PREP, GENITAL     Status: Abnormal   Collection Time   06/12/12 11:15 PM      Component Value Range   Yeast Wet Prep HPF POC NONE SEEN  NONE SEEN   Trich, Wet Prep NONE SEEN  NONE SEEN   Clue Cells Wet Prep HPF POC MODERATE (*) NONE SEEN   WBC, Wet Prep HPF POC FEW (*) NONE SEEN    Assessment: Bacterial vaginosis   Elevated BP  Plan:  Rx Flagyl   GC, Chlamydia cultures pending  Discussed with patient elevated blood pressure and she states it is because she has not taken her medication today. She will go home and take her medication now and follow up with her doctor.   Luther Newhouse, RN, FNP, [facility] 06/12/2012, 11:21 PM

## 2012-06-12 NOTE — MAU Note (Signed)
PT SAYS HAS RUNNY NOSE- HAS BEEN TAKING MEDS FOR ALLERGIES- BUT DOESN'T WORK.     PT HAS HAD BV BEFORE- THINKS SHE HAS IT NOW-  HAS  VAG D/C-  WHITE- WITH ODOR.  NO BIRTH CONTROL

## 2012-06-12 NOTE — MAU Note (Signed)
DR HARPER DELIVERED LAST BABY.  PT GOES TO  GYN CLINIC FOR CARE-  HAS APPOINTMENT ON 9-17.

## 2012-06-12 NOTE — MAU Note (Signed)
Pt states she knows she has bacterial vaginosis-she has had it before-states also has a cold

## 2012-06-13 LAB — GC/CHLAMYDIA PROBE AMP, GENITAL: GC Probe Amp, Genital: NEGATIVE

## 2012-06-13 NOTE — MAU Provider Note (Signed)
Attestation of Attending Supervision of Advanced Practitioner (CNM/NP): Evaluation and management procedures were performed by the Advanced Practitioner under my supervision and collaboration.  I have reviewed the Advanced Practitioner's note and chart, and I agree with the management and plan.  HARRAWAY-SMITH, Najeh Credit 2:51 AM

## 2012-06-17 ENCOUNTER — Emergency Department (HOSPITAL_COMMUNITY)
Admission: EM | Admit: 2012-06-17 | Discharge: 2012-06-17 | Disposition: A | Discharge status: Home / Self Care | Payer: Self-pay | Attending: Emergency Medicine | Admitting: Emergency Medicine

## 2012-06-17 DIAGNOSIS — J45909 Unspecified asthma, uncomplicated: Secondary | ICD-10-CM | POA: Insufficient documentation

## 2012-06-17 DIAGNOSIS — I10 Essential (primary) hypertension: Secondary | ICD-10-CM | POA: Insufficient documentation

## 2012-06-17 DIAGNOSIS — B349 Viral infection, unspecified: Secondary | ICD-10-CM

## 2012-06-17 DIAGNOSIS — R112 Nausea with vomiting, unspecified: Secondary | ICD-10-CM | POA: Insufficient documentation

## 2012-06-17 DIAGNOSIS — B9789 Other viral agents as the cause of diseases classified elsewhere: Secondary | ICD-10-CM | POA: Insufficient documentation

## 2012-06-17 MED ORDER — IBUPROFEN 600 MG PO TABS: 600.0000 mg | ORAL_TABLET | Freq: Four times a day (QID) | ORAL | Status: AC | PRN

## 2012-06-17 MED ORDER — FLUTICASONE PROPIONATE 50 MCG/ACT NA SUSP: 2.0000 | Freq: Every day | NASAL | Status: DC

## 2012-06-17 NOTE — Discharge Instructions (Signed)
Use flonase as discussed. Alternate tylenol and ibuprofen every 4-6 hours. Rest and drink lots of water. Return to ED if your symptoms worsen. Viral Infections A viral infection can be caused by different types of viruses.Most viral infections are not serious and resolve on their own. However, some infections may cause severe symptoms and may lead to further complications. SYMPTOMS Viruses can frequently cause:  Minor sore throat.   Aches and pains.   Headaches.   Runny nose.   Different types of rashes.   Watery eyes.   Tiredness.   Cough.   Loss of appetite.   Gastrointestinal infections, resulting in nausea, vomiting, and diarrhea.  These symptoms do not respond to antibiotics because the infection is not caused by bacteria. However, you might catch a bacterial infection following the viral infection. This is sometimes called a "superinfection." Symptoms of such a bacterial infection may include:  Worsening sore throat with pus and difficulty swallowing.   Swollen neck glands.   Chills and a high or persistent fever.   Severe headache.   Tenderness over the sinuses.   Persistent overall ill feeling (malaise), muscle aches, and tiredness (fatigue).   Persistent cough.   Yellow, green, or brown mucus production with coughing.  HOME CARE INSTRUCTIONS   Only take over-the-counter or prescription medicines for pain, discomfort, diarrhea, or fever as directed by your caregiver.   Drink enough water and fluids to keep your urine clear or pale yellow. Sports drinks can provide valuable electrolytes, sugars, and hydration.   Get plenty of rest and maintain proper nutrition. Soups and broths with crackers or rice are fine.  SEEK IMMEDIATE MEDICAL CARE IF:   You have severe headaches, shortness of breath, chest pain, neck pain, or an unusual rash.   You have uncontrolled vomiting, diarrhea, or you are unable to keep down fluids.   You or your child has an oral  temperature above 102 F (38.9 C), not controlled by medicine.   Your baby is older than 3 months with a rectal temperature of 102 F (38.9 C) or higher.   Your baby is 78 months old or younger with a rectal temperature of 100.4 F (38 C) or higher.  MAKE SURE YOU:   Understand these instructions.   Will watch your condition.   Will get help right away if you are not doing well or get worse.  Document Released: 07/01/2005 Document Revised: 09/10/2011 Document Reviewed: 01/26/2011 [facility] Patient Information 2012 [place], [place].

## 2012-06-17 NOTE — ED Provider Notes (Signed)
History     CSN: [id number]  Arrival date & time 06/17/12  1017   First MD Initiated Contact with Patient 06/17/12 1020      No chief complaint on file.   (Consider location/radiation/quality/duration/timing/severity/associated sxs/prior treatment) HPI Comments: 26 year old female presents to the emergency department with worsening flulike symptoms since Monday. She started off having cold sweats, and then became very congested with a runny nose. She feels very tired and her whole body aches. She tried taking Tylenol Cold and flu without much relief with her symptoms. Admits to shortness of breath on exertion. She did not take her temperature. No one is sick around her. Yesterday she felt nauseous and vomited once with one episode of diarrhea. She denies any cough, sore throat, earache, chest pain.  The history is provided by the patient.    Past Medical History  Diagnosis Date  . Hypertension   . Asthma     No past surgical history on file.  Family History  Problem Relation Age of Onset  . Anesthesia problems Neg Hx     History  Substance Use Topics  . Smoking status: Never Smoker   . Smokeless tobacco: Not on file  . Alcohol Use: Yes     OCCAS    OB History    Grav Para Term Preterm Abortions TAB SAB Ect Mult Living   11 3 3  0 8 5 3   3       Review of Systems  Constitutional: Positive for chills, diaphoresis and appetite change.  HENT: Positive for congestion and rhinorrhea. Negative for ear pain, sore throat, neck pain and neck stiffness.   Respiratory: Positive for shortness of breath. Negative for cough.   Cardiovascular: Negative for chest pain.  Gastrointestinal: Positive for nausea, vomiting and diarrhea. Negative for abdominal pain.  Musculoskeletal: Negative for back pain.  Skin: Negative for rash.  Neurological: Positive for weakness.    Allergies  Review of patient's allergies indicates no known allergies.  Home Medications   Current Outpatient  Rx  Name Route Sig Dispense Refill  . ALBUTEROL SULFATE HFA 108 (90 BASE) MCG/ACT IN AERS Inhalation Inhale 2 puffs into the lungs every 6 (six) hours as needed. For shortness of breath.    Marland Kitchen DIPHENHYDRAMINE HCL 25 MG PO TABS Oral Take 25 mg by mouth every 6 (six) hours as needed. For allergies.    Marland Kitchen HYDROCHLOROTHIAZIDE 25 MG PO TABS Oral Take 12.5 mg by mouth daily.    Marland Kitchen METRONIDAZOLE 500 MG PO TABS Oral Take 500 mg by mouth 2 (two) times daily. Take for 10 days.  Final dose due 06/19/2012.    Marland Kitchen SINUS PO Oral Take 5 mLs by mouth daily as needed. For sinus congestion.      BP 138/98  Pulse 111  Temp 98.1 F (36.7 C) (Oral)  Resp 20  SpO2 98%  LMP 06/06/2012  Physical Exam  Constitutional: She is oriented to person, place, and time. She appears well-developed and well-nourished. No distress.  HENT:  Head: Normocephalic and atraumatic.  Right Ear: Tympanic membrane, external ear and ear canal normal.  Left Ear: Tympanic membrane, external ear and ear canal normal.  Nose: Mucosal edema present. No epistaxis. Right sinus exhibits no maxillary sinus tenderness and no frontal sinus tenderness. Left sinus exhibits no maxillary sinus tenderness and no frontal sinus tenderness.  Mouth/Throat: Oropharynx is clear and moist and mucous membranes are normal.       Turbinates enlarged and inflammed b/l.  Eyes: Conjunctivae  normal are normal.  Neck: Normal range of motion. Neck supple.  Cardiovascular: Regular rhythm and normal heart sounds.  Tachycardia present.   Pulmonary/Chest: Effort normal and breath sounds normal.  Abdominal: Soft. Bowel sounds are normal. There is no tenderness.  Musculoskeletal: Normal range of motion. She exhibits no edema.  Neurological: She is alert and oriented to person, place, and time.  Skin: Skin is warm and dry. No rash noted. She is not diaphoretic.  Psychiatric: She has a normal mood and affect. Her behavior is normal.    ED Course  Procedures (including  critical care time)  Labs Reviewed - No data to display No results found.   1. Viral illness       MDM  26 year old female flulike symptoms. Explained viruses in detail with patient. She is in NAD. Exam consistent with viral illness. Will discharge her with ibuprofen and nose spray.       Trevor Mace, PA-C 06/17/12 1104

## 2012-06-22 NOTE — ED Provider Notes (Signed)
Medical screening examination/treatment/procedure(s) were performed by non-physician practitioner and as supervising physician I was immediately available for consultation/collaboration.   Eulene Pekar M Estevan Kersh, DO 06/22/12 1232 

## 2012-07-21 ENCOUNTER — Emergency Department (HOSPITAL_COMMUNITY)
Admission: EM | Admit: 2012-07-21 | Discharge: 2012-07-21 | Disposition: A | Discharge status: Home / Self Care | Payer: No Typology Code available for payment source | Attending: Emergency Medicine | Admitting: Emergency Medicine

## 2012-07-21 ENCOUNTER — Encounter (HOSPITAL_COMMUNITY): Payer: Self-pay | Admitting: Family Medicine

## 2012-07-21 DIAGNOSIS — I10 Essential (primary) hypertension: Secondary | ICD-10-CM | POA: Insufficient documentation

## 2012-07-21 DIAGNOSIS — T148XXA Other injury of unspecified body region, initial encounter: Secondary | ICD-10-CM

## 2012-07-21 DIAGNOSIS — Y998 Other external cause status: Secondary | ICD-10-CM | POA: Insufficient documentation

## 2012-07-21 DIAGNOSIS — IMO0002 Reserved for concepts with insufficient information to code with codable children: Secondary | ICD-10-CM | POA: Insufficient documentation

## 2012-07-21 DIAGNOSIS — N760 Acute vaginitis: Secondary | ICD-10-CM

## 2012-07-21 DIAGNOSIS — A499 Bacterial infection, unspecified: Secondary | ICD-10-CM | POA: Insufficient documentation

## 2012-07-21 DIAGNOSIS — J45909 Unspecified asthma, uncomplicated: Secondary | ICD-10-CM | POA: Insufficient documentation

## 2012-07-21 DIAGNOSIS — Y93I9 Activity, other involving external motion: Secondary | ICD-10-CM | POA: Insufficient documentation

## 2012-07-21 DIAGNOSIS — B9689 Other specified bacterial agents as the cause of diseases classified elsewhere: Secondary | ICD-10-CM | POA: Insufficient documentation

## 2012-07-21 LAB — URINALYSIS, ROUTINE W REFLEX MICROSCOPIC
Glucose, UA: NEGATIVE mg/dL
Hgb urine dipstick: NEGATIVE
Ketones, ur: NEGATIVE mg/dL
Protein, ur: NEGATIVE mg/dL

## 2012-07-21 LAB — WET PREP, GENITAL
Trich, Wet Prep: NONE SEEN
WBC, Wet Prep HPF POC: NONE SEEN
Yeast Wet Prep HPF POC: NONE SEEN

## 2012-07-21 MED ORDER — IBUPROFEN 800 MG PO TABS: 800.0000 mg | ORAL_TABLET | Freq: Three times a day (TID) | ORAL | Status: DC

## 2012-07-21 MED ORDER — METRONIDAZOLE 0.75 % VA GEL: 1.0000 | Freq: Two times a day (BID) | VAGINAL | Status: DC
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
## 2012-07-21 NOTE — ED Provider Notes (Signed)
Medical screening examination/treatment/procedure(s) were performed by non-physician practitioner and as supervising physician I was immediately available for consultation/collaboration.  Aarica Wax R. Camielle Sizer, MD 07/21/12 2356 

## 2012-07-22 NOTE — ED Notes (Signed)
States she left w/o getting prescriptions; prescriptions per d/c instructions for ibuprofen and metrogel called in to [facility] at [phone].

## 2012-07-25 ENCOUNTER — Emergency Department (INDEPENDENT_AMBULATORY_CARE_PROVIDER_SITE_OTHER)
Admission: EM | Admit: 2012-07-25 | Discharge: 2012-07-25 | Disposition: A | Discharge status: Home / Self Care | Payer: Self-pay | Source: Home / Self Care | Attending: Emergency Medicine | Admitting: Emergency Medicine

## 2012-07-25 ENCOUNTER — Encounter (HOSPITAL_COMMUNITY): Payer: Self-pay

## 2012-07-25 DIAGNOSIS — S139XXA Sprain of joints and ligaments of unspecified parts of neck, initial encounter: Secondary | ICD-10-CM

## 2012-07-25 DIAGNOSIS — IMO0002 Reserved for concepts with insufficient information to code with codable children: Secondary | ICD-10-CM

## 2012-07-25 MED ORDER — MELOXICAM 7.5 MG PO TABS: 7.5000 mg | ORAL_TABLET | Freq: Every day | ORAL | Status: DC

## 2012-07-25 MED ORDER — CYCLOBENZAPRINE HCL 10 MG PO TABS: 10.0000 mg | ORAL_TABLET | Freq: Two times a day (BID) | ORAL | Status: DC | PRN
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
## 2012-09-02 ENCOUNTER — Encounter (HOSPITAL_COMMUNITY): Payer: Self-pay

## 2012-09-02 ENCOUNTER — Inpatient Hospital Stay (HOSPITAL_COMMUNITY)
Admission: AD | Admit: 2012-09-02 | Discharge: 2012-09-02 | Disposition: A | Discharge status: Home / Self Care | Payer: Self-pay | Source: Ambulatory Visit | Attending: Family Medicine | Admitting: Family Medicine

## 2012-09-02 DIAGNOSIS — R109 Unspecified abdominal pain: Secondary | ICD-10-CM | POA: Insufficient documentation

## 2012-09-02 DIAGNOSIS — N390 Urinary tract infection, site not specified: Secondary | ICD-10-CM | POA: Insufficient documentation

## 2012-09-02 DIAGNOSIS — R300 Dysuria: Secondary | ICD-10-CM | POA: Insufficient documentation

## 2012-09-02 LAB — URINALYSIS, ROUTINE W REFLEX MICROSCOPIC
Ketones, ur: NEGATIVE mg/dL
Leukocytes, UA: NEGATIVE
Nitrite: NEGATIVE
Protein, ur: 30 mg/dL — AB

## 2012-09-02 LAB — URINE MICROSCOPIC-ADD ON

## 2012-09-02 MED ORDER — PHENAZOPYRIDINE HCL 100 MG PO TABS
200.0000 mg | ORAL_TABLET | Freq: Once | ORAL | Status: AC
  Administered 2012-09-02: 200 mg via ORAL
  Filled 2012-09-02: qty 2

## 2012-09-02 MED ORDER — SULFAMETHOXAZOLE-TMP DS 800-160 MG PO TABS: 1.0000 | ORAL_TABLET | Freq: Two times a day (BID) | ORAL | Status: DC

## 2012-09-02 NOTE — MAU Note (Signed)
abd pain, started yesterday.  Pain with urination, pelvic pressure.    Frequency, urgency and pain with urination, only going a small amt, feels like not emptying bladder.

## 2012-09-02 NOTE — MAU Provider Note (Signed)
  History     CSN: [id number]  Arrival date and time: 09/02/12 1813   First Provider Initiated Contact with Patient 09/02/12 1916      Chief Complaint  Patient presents with  . Abdominal Pain  . Dysuria   HPI This is a 26 y.o. female who presents with 2 day history of dysuria, frequency and urgency. Denies fever or back pain. No other complaints  RN Note: abd pain, started yesterday. Pain with urination, pelvic pressure. Frequency, urgency and pain with urination, only going a small amt, feels like not emptying bladder  OB History    Grav Para Term Preterm Abortions TAB SAB Ect Mult Living   11 3 3  0 8 5 3   3       Past Medical History  Diagnosis Date  . Hypertension   . Asthma     History reviewed. No pertinent past surgical history.  Family History  Problem Relation Age of Onset  . Anesthesia problems Neg Hx     History  Substance Use Topics  . Smoking status: Never Smoker   . Smokeless tobacco: Not on file  . Alcohol Use: Yes     Comment: OCCAS    Allergies: No Known Allergies  Prescriptions prior to admission  Medication Sig Dispense Refill  . albuterol (PROVENTIL HFA;VENTOLIN HFA) 108 (90 BASE) MCG/ACT inhaler Inhale 2 puffs into the lungs every 6 (six) hours as needed. For shortness of breath.      . cyclobenzaprine (FLEXERIL) 10 MG tablet Take 1 tablet (10 mg total) by mouth 2 (two) times daily as needed for muscle spasms.  20 tablet  0  . diphenhydrAMINE (BENADRYL) 25 MG tablet Take 25 mg by mouth every 6 (six) hours as needed. For allergies.      . fluticasone (FLONASE) 50 MCG/ACT nasal spray Place 2 sprays into the nose daily.  16 g  2  . hydrochlorothiazide (HYDRODIURIL) 25 MG tablet Take 12.5 mg by mouth daily.      . meloxicam (MOBIC) 7.5 MG tablet Take 1 tablet (7.5 mg total) by mouth daily.  14 tablet  0  . metroNIDAZOLE (METROGEL VAGINAL) 0.75 % vaginal gel Place 1 Applicatorful vaginally 2 (two) times daily.  70 g  0    ROS See  HPI  Physical Exam   Blood pressure 144/96, pulse 92, temperature 98.5 F (36.9 C), temperature source Oral, resp. rate 18, weight 246 lb (111.585 kg), last menstrual period 08/23/2012.  Physical Exam  Constitutional: She is oriented to person, place, and time. She appears well-developed and well-nourished. No distress.  Cardiovascular: Normal rate.   Respiratory: Effort normal.  GI: Soft. She exhibits no distension and no mass. There is tenderness (over bladder). There is no rebound and no guarding.  Musculoskeletal: Normal range of motion.       No CVA tenderness  Neurological: She is alert and oriented to person, place, and time.  Skin: Skin is warm and dry.  Psychiatric: She has a normal mood and affect.    MAU Course  Procedures   Assessment and Plan  A:  UTI, simple  P:  Discharge home       Rx Septra DS for 7 days       OTC Azo PRN       Push fluids       Come back if worsens or develops fever or flank pain  Nusaybah Ivie 09/02/2012, 8:16 PM

## 2012-09-02 NOTE — MAU Note (Signed)
Did not take HCTZ, because it makes her pee.

## 2012-09-02 NOTE — MAU Note (Signed)
Patient is in with pelvic pressure and dysuria including blood in her urine. Patient is on hctz for hypertension (she states that she did not take it today because it makes her void and urinating is painful for the past 2 days.)

## 2012-09-03 NOTE — MAU Provider Note (Signed)
Chart reviewed and agree with management and plan.  

## 2012-09-04 LAB — URINE CULTURE: Colony Count: 40000

## 2013-01-07 ENCOUNTER — Encounter (HOSPITAL_COMMUNITY): Payer: Self-pay | Admitting: *Deleted

## 2013-01-07 ENCOUNTER — Inpatient Hospital Stay (HOSPITAL_COMMUNITY)
Admission: AD | Admit: 2013-01-07 | Discharge: 2013-01-07 | Disposition: A | Discharge status: Home / Self Care | Payer: Self-pay | Source: Ambulatory Visit | Attending: Obstetrics and Gynecology | Admitting: Obstetrics and Gynecology

## 2013-01-07 DIAGNOSIS — N760 Acute vaginitis: Secondary | ICD-10-CM | POA: Insufficient documentation

## 2013-01-07 DIAGNOSIS — A5901 Trichomonal vulvovaginitis: Secondary | ICD-10-CM | POA: Insufficient documentation

## 2013-01-07 DIAGNOSIS — B9689 Other specified bacterial agents as the cause of diseases classified elsewhere: Secondary | ICD-10-CM | POA: Insufficient documentation

## 2013-01-07 DIAGNOSIS — A499 Bacterial infection, unspecified: Secondary | ICD-10-CM

## 2013-01-07 DIAGNOSIS — N949 Unspecified condition associated with female genital organs and menstrual cycle: Secondary | ICD-10-CM | POA: Insufficient documentation

## 2013-01-07 LAB — URINALYSIS, ROUTINE W REFLEX MICROSCOPIC
Bilirubin Urine: NEGATIVE
Glucose, UA: NEGATIVE mg/dL
Hgb urine dipstick: NEGATIVE
Protein, ur: NEGATIVE mg/dL
Specific Gravity, Urine: 1.01 (ref 1.005–1.030)

## 2013-01-07 LAB — WET PREP, GENITAL

## 2013-01-07 LAB — POCT PREGNANCY, URINE: Preg Test, Ur: NEGATIVE

## 2013-01-07 MED ORDER — METRONIDAZOLE 500 MG PO TABS
2000.0000 mg | ORAL_TABLET | Freq: Once | ORAL | Status: AC
  Administered 2013-01-07: 2000 mg via ORAL
  Filled 2013-01-07: qty 4

## 2013-01-07 MED ORDER — METRONIDAZOLE 500 MG PO TABS: 500.0000 mg | ORAL_TABLET | Freq: Two times a day (BID) | ORAL | Status: DC

## 2013-01-07 NOTE — MAU Provider Note (Signed)
Attestation of Attending Supervision of Advanced Practitioner (CNM/NP): Evaluation and management procedures were performed by the Advanced Practitioner under my supervision and collaboration.  I have reviewed the Advanced Practitioner's note and chart, and I agree with the management and plan.  Boen Sterbenz 01/07/2013 7:48 AM

## 2013-01-07 NOTE — Progress Notes (Signed)
Thressa Sheller CNM in earlier to discuss lab results and d/c plan with pt. Written and verbal d/c instructions given and understanding voiced.

## 2013-01-07 NOTE — MAU Provider Note (Signed)
History     CSN: [id number]  Arrival date and time: 01/07/13 0222   None     Chief Complaint  Patient presents with  . Vaginal Discharge   HPI  Emma Page is a 27 y.o. [id number] who presents today with white vaginal discharge X 1 week. She denies odor or itching. She states that she has frequent BV infections. She has been taking a probiotic and she stopped douching. That had stopped the BV infection for about 2 years, but then in the last year they have returned and have been frequent again.   Past Medical History  Diagnosis Date  . Hypertension   . Asthma     History reviewed. No pertinent past surgical history.  Family History  Problem Relation Age of Onset  . Anesthesia problems Neg Hx     History  Substance Use Topics  . Smoking status: Never Smoker   . Smokeless tobacco: Not on file  . Alcohol Use: Yes     Comment: OCCAS    Allergies: No Known Allergies  Prescriptions prior to admission  Medication Sig Dispense Refill  . albuterol (PROVENTIL HFA;VENTOLIN HFA) 108 (90 BASE) MCG/ACT inhaler Inhale 2 puffs into the lungs every 6 (six) hours as needed. For shortness of breath.      . cyclobenzaprine (FLEXERIL) 10 MG tablet Take 1 tablet (10 mg total) by mouth 2 (two) times daily as needed for muscle spasms.  20 tablet  0  . diphenhydrAMINE (BENADRYL) 25 MG tablet Take 25 mg by mouth every 6 (six) hours as needed. For allergies.      . fluticasone (FLONASE) 50 MCG/ACT nasal spray Place 2 sprays into the nose daily.  16 g  2  . hydrochlorothiazide (HYDRODIURIL) 25 MG tablet Take 12.5 mg by mouth daily.      . meloxicam (MOBIC) 7.5 MG tablet Take 1 tablet (7.5 mg total) by mouth daily.  14 tablet  0  . metroNIDAZOLE (METROGEL VAGINAL) 0.75 % vaginal gel Place 1 Applicatorful vaginally 2 (two) times daily.  70 g  0  . sulfamethoxazole-trimethoprim (BACTRIM DS) 800-160 MG per tablet Take 1 tablet by mouth 2 (two) times daily.  14 tablet  0    Review of  Systems  Constitutional: Negative for fever and chills.  Gastrointestinal: Negative for nausea, vomiting, abdominal pain, diarrhea and constipation.  Genitourinary: Negative for dysuria, urgency and frequency.   Physical Exam   Blood pressure 146/98, pulse 89, temperature 98.2 F (36.8 C), resp. rate 18, height 5\' 5"  (1.651 m), weight 230 lb (104.327 kg), last menstrual period 12/31/2012, SpO2 99.00%.  Physical Exam  Nursing note and vitals reviewed. Constitutional: She is oriented to person, place, and time. She appears well-developed and well-nourished. No distress.  Cardiovascular: Normal rate.   Respiratory: Effort normal.  GI: Soft. There is no tenderness.  Genitourinary:   External: no lesion Vagina: small amount of thin white discharge Cervix: pink, smooth. No CMT Uterus: AVA, NSSC Adnexa: NT  Neurological: She is alert and oriented to person, place, and time.  Skin: Skin is warm and dry.  Psychiatric: She has a normal mood and affect.    MAU Course  Procedures  Results for orders placed during the hospital encounter of 01/07/13 (from the past 24 hour(s))  URINALYSIS, ROUTINE W REFLEX MICROSCOPIC     Status: None   Collection Time    01/07/13  2:44 AM      Result Value Range   Color, Urine YELLOW  YELLOW   APPearance CLEAR  CLEAR   Specific Gravity, Urine 1.010  1.005 - 1.030   pH 6.5  5.0 - 8.0   Glucose, UA NEGATIVE  NEGATIVE mg/dL   Hgb urine dipstick NEGATIVE  NEGATIVE   Bilirubin Urine NEGATIVE  NEGATIVE   Ketones, ur NEGATIVE  NEGATIVE mg/dL   Protein, ur NEGATIVE  NEGATIVE mg/dL   Urobilinogen, UA 0.2  0.0 - 1.0 mg/dL   Nitrite NEGATIVE  NEGATIVE   Leukocytes, UA NEGATIVE  NEGATIVE  POCT PREGNANCY, URINE     Status: None   Collection Time    01/07/13  2:54 AM      Result Value Range   Preg Test, Ur NEGATIVE  NEGATIVE  WET PREP, GENITAL     Status: Abnormal   Collection Time    01/07/13  3:15 AM      Result Value Range   Yeast Wet Prep HPF POC  NONE SEEN  NONE SEEN   Trich, Wet Prep FEW (*) NONE SEEN   Clue Cells Wet Prep HPF POC FEW (*) NONE SEEN   WBC, Wet Prep HPF POC FEW (*) NONE SEEN     Assessment and Plan   1. BV (bacterial vaginosis)   2. Trichomonal vulvovaginitis    Treated here in MAU with 2g flagyl Discussed partner treatment  Tawnya Crook 01/07/2013, 2:59 AM

## 2013-01-07 NOTE — MAU Note (Signed)
Vaginal d/c for a week. White in color and no odor or itching.

## 2013-09-25 ENCOUNTER — Inpatient Hospital Stay (HOSPITAL_COMMUNITY)
Admission: AD | Admit: 2013-09-25 | Discharge: 2013-09-25 | Disposition: A | Discharge status: Home / Self Care | Payer: No Typology Code available for payment source | Source: Ambulatory Visit | Attending: Obstetrics & Gynecology | Admitting: Obstetrics & Gynecology

## 2013-09-25 ENCOUNTER — Encounter (HOSPITAL_COMMUNITY): Payer: Self-pay | Admitting: *Deleted

## 2013-09-25 DIAGNOSIS — I10 Essential (primary) hypertension: Secondary | ICD-10-CM

## 2013-09-25 DIAGNOSIS — L293 Anogenital pruritus, unspecified: Secondary | ICD-10-CM | POA: Insufficient documentation

## 2013-09-25 DIAGNOSIS — N899 Noninflammatory disorder of vagina, unspecified: Secondary | ICD-10-CM | POA: Insufficient documentation

## 2013-09-25 DIAGNOSIS — B373 Candidiasis of vulva and vagina: Secondary | ICD-10-CM

## 2013-09-25 LAB — URINALYSIS, ROUTINE W REFLEX MICROSCOPIC
Bilirubin Urine: NEGATIVE
Ketones, ur: NEGATIVE mg/dL
Nitrite: NEGATIVE
pH: 6 (ref 5.0–8.0)

## 2013-09-25 LAB — WET PREP, GENITAL: Trich, Wet Prep: NONE SEEN

## 2013-09-25 MED ORDER — FLUCONAZOLE 150 MG PO TABS: ORAL_TABLET | ORAL | Status: DC

## 2013-09-25 MED ORDER — HYDROCHLOROTHIAZIDE 25 MG PO TABS: 25.0000 mg | ORAL_TABLET | Freq: Every day | ORAL | Status: DC

## 2013-09-25 MED ORDER — FLUCONAZOLE 150 MG PO TABS
150.0000 mg | ORAL_TABLET | ORAL | Status: AC
  Administered 2013-09-25: 150 mg via ORAL
  Filled 2013-09-25: qty 1

## 2013-09-25 MED ORDER — NYSTATIN-TRIAMCINOLONE 100000-0.1 UNIT/GM-% EX CREA: 1.0000 "application " | TOPICAL_CREAM | Freq: Two times a day (BID) | CUTANEOUS | Status: DC

## 2013-09-25 MED ORDER — MEDROXYPROGESTERONE ACETATE 150 MG/ML IM SUSP
150.0000 mg | INTRAMUSCULAR | Status: AC
  Administered 2013-09-25: 150 mg via INTRAMUSCULAR
  Filled 2013-09-25: qty 1

## 2013-09-25 MED ORDER — HYDROCHLOROTHIAZIDE 25 MG PO TABS
25.0000 mg | ORAL_TABLET | ORAL | Status: AC
  Administered 2013-09-25: 25 mg via ORAL
  Filled 2013-09-25: qty 1

## 2013-09-25 NOTE — MAU Note (Signed)
Vaginal irritation since this am. Vaginal itching. No discharge.

## 2013-09-26 LAB — GC/CHLAMYDIA PROBE AMP
CT Probe RNA: NEGATIVE
GC Probe RNA: NEGATIVE

## 2013-12-18 ENCOUNTER — Ambulatory Visit: Payer: Self-pay

## 2014-01-22 ENCOUNTER — Encounter (HOSPITAL_COMMUNITY): Payer: Self-pay | Admitting: *Deleted

## 2014-01-22 ENCOUNTER — Inpatient Hospital Stay (HOSPITAL_COMMUNITY)
Admission: AD | Admit: 2014-01-22 | Discharge: 2014-01-22 | Disposition: A | Discharge status: Home / Self Care | Payer: Self-pay | Source: Ambulatory Visit | Attending: Obstetrics & Gynecology | Admitting: Obstetrics & Gynecology

## 2014-01-22 DIAGNOSIS — N926 Irregular menstruation, unspecified: Secondary | ICD-10-CM

## 2014-01-22 DIAGNOSIS — N938 Other specified abnormal uterine and vaginal bleeding: Secondary | ICD-10-CM | POA: Insufficient documentation

## 2014-01-22 DIAGNOSIS — N925 Other specified irregular menstruation: Secondary | ICD-10-CM | POA: Insufficient documentation

## 2014-01-22 DIAGNOSIS — I87309 Chronic venous hypertension (idiopathic) without complications of unspecified lower extremity: Secondary | ICD-10-CM | POA: Insufficient documentation

## 2014-01-22 DIAGNOSIS — N949 Unspecified condition associated with female genital organs and menstrual cycle: Secondary | ICD-10-CM | POA: Insufficient documentation

## 2014-01-22 DIAGNOSIS — N930 Postcoital and contact bleeding: Secondary | ICD-10-CM | POA: Insufficient documentation

## 2014-01-22 LAB — WET PREP, GENITAL
Clue Cells Wet Prep HPF POC: NONE SEEN
Trich, Wet Prep: NONE SEEN
WBC, Wet Prep HPF POC: NONE SEEN
Yeast Wet Prep HPF POC: NONE SEEN

## 2014-01-22 LAB — POCT PREGNANCY, URINE: PREG TEST UR: NEGATIVE

## 2014-01-22 NOTE — MAU Provider Note (Signed)
CC: Vaginal Bleeding    First Provider Initiated Contact with Patient 01/22/14 1452      HPI Emma Page is a 28 y.o. [id number] who presents with onset 2-3 weeks ago of vaginal spotting after intercourse. Has not had menstrual-like bleeding; wears minipad. Denies dyspareunia. No abdominal pain or pelvic pain. No irritative or malodorous vaginal discharge. She was due for Depoprovera  in March but decided not to take it (after first shot 4 months ago) because of concern about weight gain. Plans to get contraception after her Medicaid comes through. States Paps up to date and all nl.  Did not have bleeding on Depo. No PCP. Took HCTZ late today.   Past Medical History  Diagnosis Date  . Hypertension   . Asthma     OB History  Gravida Para Term Preterm AB SAB TAB Ectopic Multiple Living  11 3 3  0 8 3 5   3     # Outcome Date GA Lbr Len/2nd Weight Sex Delivery Anes PTL Lv  11 TAB           10 TAB           9 TAB           8 TAB           7 TAB           6 SAB           5 SAB           4 SAB           3 TRM     F SVD   Y  2 TRM     M SVD   Y  1 TRM     F SVD   Y      Past Surgical History  Procedure Laterality Date  . Dilation and curettage of uterus    . Abortion      History   Social History  . Marital Status: Single    Spouse Name: N/A    Number of Children: N/A  . Years of Education: N/A   Occupational History  . Not on file.   Social History Main Topics  . Smoking status: Never Smoker   . Smokeless tobacco: Not on file  . Alcohol Use: No  . Drug Use: No  . Sexual Activity: Yes    Birth Control/ Protection: None   Other Topics Concern  . Not on file   Social History Narrative  . No narrative on file    No current facility-administered medications on file prior to encounter.   Current Outpatient Prescriptions on File Prior to Encounter  Medication Sig Dispense Refill  . aspirin-acetaminophen-caffeine (EXCEDRIN MIGRAINE) 250-250-65 MG per  tablet Take 1 tablet by mouth every 6 (six) hours as needed for pain.      . hydrochlorothiazide (HYDRODIURIL) 25 MG tablet Take 1 tablet (25 mg total) by mouth daily.  30 tablet  0  . albuterol (PROVENTIL HFA;VENTOLIN HFA) 108 (90 BASE) MCG/ACT inhaler Inhale 2 puffs into the lungs every 6 (six) hours as needed. For shortness of breath.        No Known Allergies  ROS Pertinent items in HPI  PHYSICAL EXAM   Filed Vitals:   01/22/14 1440 01/22/14 1500 01/22/14 1614  BP: 143/110 152/101 143/108  Pulse: 97 87 96  Temp: 99.2 F (37.3 C)    TempSrc: Oral    Resp: 20  General: Well nourished, well developed female in no acute distress Cardiovascular: Normal rate Respiratory: Normal effort Abdomen: Soft, nontender Back: No CVAT Extremities: No edema Neurologic: Alert and oriented Speculum exam: NEFG; vagina with physiologic discharge, scant dark blood in vault; cervix clean Bimanual exam: cervix closed, no CMT; uterus NSSP; no adnexal tenderness or masses   LAB RESULTS Results for orders placed during the hospital encounter of 01/22/14 (from the past 24 hour(s))  POCT PREGNANCY, URINE     Status: None   Collection Time    01/22/14  2:31 PM      Result Value Ref Range   Preg Test, Ur NEGATIVE  NEGATIVE  WET PREP, GENITAL     Status: None   Collection Time    01/22/14  3:00 PM      Result Value Ref Range   Yeast Wet Prep HPF POC NONE SEEN  NONE SEEN   Trich, Wet Prep NONE SEEN  NONE SEEN   Clue Cells Wet Prep HPF POC NONE SEEN  NONE SEEN   WBC, Wet Prep HPF POC NONE SEEN  NONE SEEN    IMAGING No results found.  MAU COURSE GC/CT sent  ASSESSMENT  1. Irregular uterine bleeding   2. Chronic venous hypertension   Bleeding likely due to discontinuation of Depoprovera  PLAN Discharge home. See AVS for patient education. Keep bleeding calendar. Abstain or use condoms until GYN appointment Need for PCP explained> will call [facility]   Medication List          albuterol 108 (90 BASE) MCG/ACT inhaler  Commonly known as:  PROVENTIL HFA;VENTOLIN HFA  Inhale 2 puffs into the lungs every 6 (six) hours as needed. For shortness of breath.     aspirin-acetaminophen-caffeine 250-250-65 MG per tablet  Commonly known as:  EXCEDRIN MIGRAINE  Take 1 tablet by mouth every 6 (six) hours as needed for pain.     hydrochlorothiazide 25 MG tablet  Commonly known as:  HYDRODIURIL  Take 1 tablet (25 mg total) by mouth daily.       Follow-up Information   Follow up with [facility]. (Someone from Clinic will call you with appt.)    Contact information:   [address] [place] [address] [phone]        Danae OrleansDeirdre C Arlina Sabina, CNM 01/22/2014 2:52 PM

## 2014-01-22 NOTE — MAU Note (Signed)
Bleeding after intercourse for last 2-3 weeks, denies pain today.

## 2014-01-22 NOTE — Discharge Instructions (Signed)

## 2014-01-22 NOTE — MAU Note (Signed)
Pt was due for depo in March, did not get this dose.

## 2014-01-22 NOTE — MAU Provider Note (Signed)
Attestation of Attending Supervision of Advanced Practitioner (CNM/NP): Evaluation and management procedures were performed by the Advanced Practitioner under my supervision and collaboration. I have reviewed the Advanced Practitioner's note and chart, and I agree with the management and plan.  Lesly DukesKelly H Lareta Bruneau 7:54 PM

## 2014-01-23 LAB — GC/CHLAMYDIA PROBE AMP
CT Probe RNA: NEGATIVE
GC PROBE AMP APTIMA: NEGATIVE

## 2014-02-10 ENCOUNTER — Other Ambulatory Visit: Payer: Self-pay | Admitting: Advanced Practice Midwife

## 2014-02-12 ENCOUNTER — Other Ambulatory Visit: Payer: Self-pay | Admitting: Advanced Practice Midwife

## 2014-02-12 MED ORDER — HYDROCHLOROTHIAZIDE 25 MG PO TABS: 25.0000 mg | ORAL_TABLET | Freq: Every day | ORAL | Status: DC

## 2014-03-23 ENCOUNTER — Encounter: Payer: Self-pay | Admitting: Family Medicine

## 2014-08-06 ENCOUNTER — Encounter (HOSPITAL_COMMUNITY): Payer: Self-pay | Admitting: *Deleted

## 2014-09-11 ENCOUNTER — Inpatient Hospital Stay (HOSPITAL_COMMUNITY)
Admission: AD | Admit: 2014-09-11 | Discharge: 2014-09-11 | Disposition: A | Discharge status: Home / Self Care | Payer: Self-pay | Source: Ambulatory Visit | Attending: Obstetrics and Gynecology | Admitting: Obstetrics and Gynecology

## 2014-09-11 ENCOUNTER — Inpatient Hospital Stay (HOSPITAL_COMMUNITY): Payer: Self-pay

## 2014-09-11 ENCOUNTER — Encounter (HOSPITAL_COMMUNITY): Payer: Self-pay | Admitting: *Deleted

## 2014-09-11 DIAGNOSIS — B3731 Acute candidiasis of vulva and vagina: Secondary | ICD-10-CM

## 2014-09-11 DIAGNOSIS — R109 Unspecified abdominal pain: Secondary | ICD-10-CM | POA: Insufficient documentation

## 2014-09-11 DIAGNOSIS — O9989 Other specified diseases and conditions complicating pregnancy, childbirth and the puerperium: Secondary | ICD-10-CM | POA: Insufficient documentation

## 2014-09-11 DIAGNOSIS — O210 Mild hyperemesis gravidarum: Secondary | ICD-10-CM | POA: Insufficient documentation

## 2014-09-11 DIAGNOSIS — B373 Candidiasis of vulva and vagina: Secondary | ICD-10-CM

## 2014-09-11 DIAGNOSIS — O98811 Other maternal infectious and parasitic diseases complicating pregnancy, first trimester: Secondary | ICD-10-CM

## 2014-09-11 DIAGNOSIS — O26899 Other specified pregnancy related conditions, unspecified trimester: Secondary | ICD-10-CM

## 2014-09-11 DIAGNOSIS — Z3A01 Less than 8 weeks gestation of pregnancy: Secondary | ICD-10-CM | POA: Insufficient documentation

## 2014-09-11 LAB — CBC
HEMATOCRIT: 33.6 % — AB (ref 36.0–46.0)
HEMOGLOBIN: 11.4 g/dL — AB (ref 12.0–15.0)
MCH: 28.6 pg (ref 26.0–34.0)
MCHC: 33.9 g/dL (ref 30.0–36.0)
MCV: 84.2 fL (ref 78.0–100.0)
Platelets: 335 10*3/uL (ref 150–400)
RBC: 3.99 MIL/uL (ref 3.87–5.11)
RDW: 13.9 % (ref 11.5–15.5)
WBC: 9.6 10*3/uL (ref 4.0–10.5)

## 2014-09-11 LAB — HIV ANTIBODY (ROUTINE TESTING W REFLEX): HIV 1&2 Ab, 4th Generation: NONREACTIVE

## 2014-09-11 LAB — WET PREP, GENITAL
CLUE CELLS WET PREP: NONE SEEN
TRICH WET PREP: NONE SEEN

## 2014-09-11 LAB — POCT PREGNANCY, URINE: PREG TEST UR: POSITIVE — AB

## 2014-09-11 LAB — RPR

## 2014-09-11 LAB — HCG, QUANTITATIVE, PREGNANCY: HCG, BETA CHAIN, QUANT, S: 41404 m[IU]/mL — AB (ref <5)

## 2014-09-11 MED ORDER — MECLIZINE HCL 25 MG PO TABS
25.0000 mg | ORAL_TABLET | Freq: Once | ORAL | Status: AC
  Administered 2014-09-11: 25 mg via ORAL
  Filled 2014-09-11: qty 1

## 2014-09-11 MED ORDER — GUAIFENESIN ER 600 MG PO TB12
600.0000 mg | ORAL_TABLET | Freq: Two times a day (BID) | ORAL | Status: DC
  Administered 2014-09-11: 600 mg via ORAL
  Filled 2014-09-11 (×3): qty 1

## 2014-09-11 MED ORDER — FLUCONAZOLE 150 MG PO TABS: 150.0000 mg | ORAL_TABLET | Freq: Once | ORAL | Status: DC

## 2014-09-11 NOTE — MAU Provider Note (Signed)
History     CSN: [id number]  Arrival date and time: 09/11/14 1247   First Provider Initiated Contact with Patient 09/11/14 1338      Chief Complaint  Patient presents with  . Possible Pregnancy  . Hypertension   HPIpt is G12 [id number]@ 552w1d with 5 TAB and 3 SAB- pt was not using anything for birth control.  Pt took home pregnancy test 3 days ago.Pt and partner together for about 6 months. Pt would like to be checked for infections. Pt had RCM with LMP 07/30/2014.  Pt has had nausea and some vomiting for 2 weeks.  Pt has not eaten today- concerned about nausea and vomiting- pt ate yesterday sandwich which stayed down.  Pt has hx of high blood pressure taking HCTZ for hypertension that stopped 2 weeks ago - saw Dr. Clearance CootsHarper 9 years ago Saw [facility] after Medicaid terminated ; last prescription of HCTZ from here until could get  Believe she is pregnant and it's causing my BP to go up. +HPT 3 days ago. Was told needed verification letter for pregnancy medicaid.. hasn't taken BP medication in 2 wks, was told she couldn't take the one she is on if pregnant. Pt c/o of sinus pressure pt is hot and not hot for about 2 weeks; itching and flem in throat- coughing up mucous- clear/white- drinking hot tea and cough drops. Pt doesn't have headache now.  Pt used inhaler 2 weeks ago for wheezing- uses only occasionally.  Pt denies spotting, bleeding or vaginal discharge.    Past Medical History  Diagnosis Date  . Hypertension   . Asthma     Past Surgical History  Procedure Laterality Date  . Dilation and curettage of uterus    . Abortion      Family History  Problem Relation Age of Onset  . Anesthesia problems Neg Hx   . Cancer Maternal Grandmother   . Heart disease Maternal Grandmother   . Cancer Paternal Grandmother   . Diabetes Paternal Grandfather     History  Substance Use Topics  . Smoking status: Never Smoker   . Smokeless tobacco: Not on file  . Alcohol Use: No     Allergies: No Known Allergies  Prescriptions prior to admission  Medication Sig Dispense Refill Last Dose  . albuterol (PROVENTIL HFA;VENTOLIN HFA) 108 (90 BASE) MCG/ACT inhaler Inhale 2 puffs into the lungs every 6 (six) hours as needed. For shortness of breath.   Past Week at Unknown time  . aspirin-acetaminophen-caffeine (EXCEDRIN MIGRAINE) 250-250-65 MG per tablet Take 1 tablet by mouth every 6 (six) hours as needed for pain.   Past Month at Unknown time  . hydrochlorothiazide (HYDRODIURIL) 25 MG tablet Take 1 tablet (25 mg total) by mouth daily. 30 tablet 1 Past Month at Unknown time    Review of Systems  Constitutional: Negative for fever and chills.  Respiratory: Positive for cough.        Clear sputum  Gastrointestinal: Positive for nausea, vomiting, abdominal pain and constipation. Negative for diarrhea.  Genitourinary: Negative for dysuria and urgency.   Physical Exam   Blood pressure 137/81, pulse 69, temperature 98.5 F (36.9 C), temperature source Oral, resp. rate 18, height 5\' 5"  (1.651 m), weight 206 lb (93.441 kg), last menstrual period 07/30/2014, SpO2 100 %.  Physical Exam  Nursing note and vitals reviewed. Constitutional: She is oriented to person, place, and time. She appears well-developed and well-nourished. No distress.  HENT:  Head: Normocephalic.  Eyes: Pupils are  equal, round, and reactive to light.  Neck: Normal range of motion. Neck supple.  Cardiovascular: Normal rate.   Respiratory: Effort normal.  GI: Soft.  Genitourinary:  Reddened vaginal mucosa with thickend curdly white discharge in vault; cervix clean, closed NT; uterus NSSC nt; adnexa without palpable enlargement or tenderness  Musculoskeletal: Normal range of motion.  Neurological: She is alert and oriented to person, place, and time.  Skin: Skin is warm and dry.  Psychiatric: She has a normal mood and affect.    MAU Course  Procedures Mucinex 600mg   meclezine 25mg  given for  nausea US Ob Comp Less 14 Wks  09/11/2014   CLINICAL DATA:  Abdominal pain in pregnancy. Gestational age by LMP of 6 weeks 1 day  EXAM: OBSTETRIC <14 WK [place] AND TRANSVAGINAL OB US  TECHNIQUE: Both transabdominal and transvaginal ultrasound examinations were performed for complete evaluation of the gestation as well as the maternal uterus, adnexal regions, and pelvic cul-de-sac. Transvaginal technique was performed to assess early pregnancy.  COMPARISON:  None.  FINDINGS: Intrauterine gestational sac: Visualized/normal in shape.  Yolk sac:  Visualized  Embryo:  Visualized  Cardiac Activity: Visualized  Heart Rate:  126 bpm  CRL:   8  mm   6 w 6 d                  [place] EDC: 05/01/2015  Maternal uterus/adnexae: Small subchorionic hemorrhage noted. Small right ovarian corpus luteum cyst noted. Left ovary not directly visualized, however no adnexal mass or free fluid identified.  IMPRESSION: Single living IUP measuring 6 weeks 6 days with [place] EDC of 05/01/2015.  Small subchorionic hemorrhage noted.   Electronically Signed   By: Myles Rosenthal M.D.   On: 09/11/2014 15:53   US Ob Transvaginal  09/11/2014   CLINICAL DATA:  Abdominal pain in pregnancy. Gestational age by LMP of 6 weeks 1 day  EXAM: OBSTETRIC <14 WK [place] AND TRANSVAGINAL OB US  TECHNIQUE: Both transabdominal and transvaginal ultrasound examinations were performed for complete evaluation of the gestation as well as the maternal uterus, adnexal regions, and pelvic cul-de-sac. Transvaginal technique was performed to assess early pregnancy.  COMPARISON:  None.  FINDINGS: Intrauterine gestational sac: Visualized/normal in shape.  Yolk sac:  Visualized  Embryo:  Visualized  Cardiac Activity: Visualized  Heart Rate:  126 bpm  CRL:   8  mm   6 w 6 d                  [place] EDC: 05/01/2015  Maternal uterus/adnexae: Small subchorionic hemorrhage noted. Small right ovarian corpus luteum cyst noted. Left ovary not directly visualized, however no adnexal mass or free fluid  identified.  IMPRESSION: Single living IUP measuring 6 weeks 6 days with [place] EDC of 05/01/2015.  Small subchorionic hemorrhage noted.   Electronically Signed   By: Myles Rosenthal M.D.   On: 09/11/2014 15:53   Results for orders placed or performed during the hospital encounter of 09/11/14 (from the past 24 hour(s))  Pregnancy, urine POC     Status: Abnormal   Collection Time: 09/11/14  1:07 PM  Result Value Ref Range   Preg Test, Ur POSITIVE (A) NEGATIVE  CBC     Status: Abnormal   Collection Time: 09/11/14  2:05 PM  Result Value Ref Range   WBC 9.6 4.0 - 10.5 K/uL   RBC 3.99 3.87 - 5.11 MIL/uL   Hemoglobin 11.4 (L) 12.0 - 15.0 g/dL   HCT 16.1 (L) 09.6 -  46.0 %   MCV 84.2 78.0 - 100.0 fL   MCH 28.6 26.0 - 34.0 pg   MCHC 33.9 30.0 - 36.0 g/dL   RDW 16.113.9 09.611.5 - 04.515.5 %   Platelets 335 150 - 400 K/uL  hCG, quantitative, pregnancy     Status: Abnormal   Collection Time: 09/11/14  2:05 PM  Result Value Ref Range   hCG, Beta Chain, Quant, S 41404 (H) <5 mIU/mL  Wet prep, genital     Status: Abnormal   Collection Time: 09/11/14  4:08 PM  Result Value Ref Range   Yeast Wet Prep HPF POC MODERATE (A) NONE SEEN   Trich, Wet Prep NONE SEEN NONE SEEN   Clue Cells Wet Prep HPF POC NONE SEEN NONE SEEN   WBC, Wet Prep HPF POC MANY (A) NONE SEEN   Assessment and Plan  abd pain in pregnancy Hx hypertension in pregnancy- f/u with OB care ASAP Viable SLIUP 769w6d Nausea/morning sickness contsipation- Miralax and stool softner Yeast vaginits LINEBERRY,SUSAN 09/11/2014, 1:39 PM

## 2014-09-11 NOTE — MAU Note (Signed)
Believe she is pregnant and it's causing my BP to go up.  +HPT 3 days ago.  Was told needed verification letter for pregnancy medicaid..   hasn't taken BP medication in 2 wks, was told she couldn't take the one she is on if pregnant.

## 2014-09-11 NOTE — Discharge Instructions (Signed)

## 2014-09-13 LAB — GC/CHLAMYDIA PROBE AMP
CT Probe RNA: NEGATIVE
GC Probe RNA: NEGATIVE

## 2014-12-05 ENCOUNTER — Encounter (HOSPITAL_COMMUNITY): Payer: Self-pay | Admitting: *Deleted

## 2014-12-05 ENCOUNTER — Emergency Department (HOSPITAL_COMMUNITY)
Admission: EM | Admit: 2014-12-05 | Discharge: 2014-12-05 | Disposition: A | Discharge status: Home / Self Care | Payer: Medicaid Other | Source: Home / Self Care | Attending: Emergency Medicine | Admitting: Emergency Medicine

## 2014-12-05 DIAGNOSIS — I10 Essential (primary) hypertension: Secondary | ICD-10-CM

## 2014-12-05 LAB — POCT I-STAT, CHEM 8
BUN: 16 mg/dL (ref 6–23)
Calcium, Ion: 1.14 mmol/L (ref 1.12–1.23)
Chloride: 101 mmol/L (ref 96–112)
Creatinine, Ser: 1 mg/dL (ref 0.50–1.10)
Glucose, Bld: 90 mg/dL (ref 70–99)
HEMATOCRIT: 45 % (ref 36.0–46.0)
Hemoglobin: 15.3 g/dL — ABNORMAL HIGH (ref 12.0–15.0)
POTASSIUM: 3.7 mmol/L (ref 3.5–5.1)
SODIUM: 139 mmol/L (ref 135–145)
TCO2: 24 mmol/L (ref 0–100)

## 2014-12-05 MED ORDER — HYDROCHLOROTHIAZIDE 25 MG PO TABS: 25.0000 mg | ORAL_TABLET | Freq: Every day | ORAL | Status: DC

## 2014-12-05 NOTE — ED Provider Notes (Signed)
Chief Complaint   Hypertension   History of Present Illness   AUBREIGH FUERTE is a 29 year old female who's had a 9 year history of high blood pressure. Has been on hydrochlorothiazide 25 mg a day previously, but has been off of this about 2 months. [facility] and is seen there by Dr. Tyson Dense. While she was taking the medicine she had no medication side effects. Over the past 3 days she's had some right frontal head pressure. She denies any nausea, vomiting, photophobia, or phonophobia. She's had no visual symptoms, fever, chills, or stiff neck. She denies any neurological symptoms. No history of migraine headaches in the past. The patient also feels irritable, stressed out, and exhausted. She's felt like she's had trouble focusing. She also complains of a pain in her left upper arm which was going on earlier in the week but is now gone away. She states this felt like a soreness or tightness. Her shoulder and elbow have full range of motion. There was no swelling of the arm. No chest pain or shortness of breath.  Review of Systems   Other than as noted above, the patient denies any of the following symptoms: Respiratory:  No coughing, wheezing, or shortness of breath. Cardiac:  No chest pain, tightness, pressure, palpitations, syncope, or edema. Neuro:  No headache, dizziness, blurred vision, weakness, paresthesias, or strokelike symptoms.   PMFSH   Past medical history, family history, social history, meds, and allergies were reviewed.    Physical Examination   Vital signs:  BP 152/108 mmHg  Pulse 76  Temp(Src) 99.6 F (37.6 C) (Oral)  Resp 18  SpO2 97%  LMP 11/21/2014  Breastfeeding? Unknown General:  Alert, oriented, in no distress. Eyes: PERRLA, full EOMs, fundi benign. ENT: TMs, nasal mucosa, and pharynx were clear.  No tenderness to palpation over the head or the frontal area. Neck: Supple, no adenopathy. Lungs:  Breath sounds clear and equal  bilaterally.  No wheezes, rales, or rhonchi. Heart:  Regular rhythm, no gallops, murmers, clicks or rubs.  Abdomen:  Soft and flat.  Nontender, no organomegaly or mass.  No pulsatile midline abdominal mass or bruit. Ext:  No edema, pulses full. Neurological exam:  Alert and oriented.  Speech is clear.  No pronator drift.  CNs intact.  Labs   Results for orders placed or performed during the hospital encounter of 12/05/14  I-STAT, chem 8  Result Value Ref Range   Sodium 139 135 - 145 mmol/L   Potassium 3.7 3.5 - 5.1 mmol/L   Chloride 101 96 - 112 mmol/L   BUN 16 6 - 23 mg/dL   Creatinine, Ser 8.11 0.50 - 1.10 mg/dL   Glucose, Bld 90 70 - 99 mg/dL   Calcium, Ion 9.14 7.82 - 1.23 mmol/L   TCO2 24 0 - 100 mmol/L   Hemoglobin 15.3 (H) 12.0 - 15.0 g/dL   HCT 95.6 21.3 - 08.6 %    Assessment   The encounter diagnosis was Essential hypertension.  Plan   1.  Meds:  The following meds were prescribed:   New Prescriptions   HYDROCHLOROTHIAZIDE (HYDRODIURIL) 25 MG TABLET    Take 1 tablet (25 mg total) by mouth daily.    2.  Patient Education/Counseling:  The patient was given appropriate handouts, self care instructions, and instructed in symptomatic relief. Specifically discussed salt and sodium restriction, weight control, and exercise.   3.  Follow up:  The patient was told to follow up  here if no better in 3 to 4 days, or sooner if becoming worse in any way, and given some red flag symptoms such as severe headache, vision changes, shortness of breath, chest pain or stroke like symptoms which would prompt immediate return.  Follow-up with primary care physician as soon as possible.    Reuben Likesavid C Haylen Bellotti, MD 12/05/14 [id number]

## 2014-12-05 NOTE — ED Notes (Addendum)
Just got her Medicaid card back.  Has been off BP medicine (HCTZ) for 2 mos.  C/o L arm aching onset last week.  Pain comes and goes.  Nothing makes it worse.  Emma Page its gets numb.  Also c/o pressure in her head.

## 2014-12-05 NOTE — Discharge Instructions (Signed)
Blood pressure over the ideal can put you at higher risk for stroke, heart disease, and kidney failure.  For this reason, it's important to try to get your blood pressure as close as possible to the ideal. ° °The ideal blood pressure is 120/80.  Blood pressures from 120-139 systolic over 80-89 diastolic are labeled as "prehypertension."  This means you are at higher risk of developing hypertension in the future.  Blood pressures in this range are not treated with medication, but lifestyle changes are recommended to prevent progression to hypertension.  Blood pressures of 140 and above systolic over 90 and above diastolic are classified as hypertension and are treated with medications. ° °Lifestyle changes which can benefit both prehypertension and hypertension include the following: ° °· Salt and sodium restriction. °· Weight loss. °· Regular exercise. °· Avoidance of tobacco. °· Avoidance of excess alcohol. °· The "D.A.S.H" diet. ° °· People with hypertension and prehypertension should limit their salt intake to less than 1500 mg daily.  Reading the nutrition information on the label of many prepared foods can give you an idea of how much sodium you're consuming at each meal.  Remember that the most important number on the nutrition information is the serving size.  It may be smaller than you think.  Try to avoid adding extra salt at the table.  You may add small amounts of salt while cooking.  Remember that salt is an acquired taste and you may get used to a using a whole lot less salt than you are using now.  Using less salt lets the food's natural flavors come through.  You might want to consider using salt substitutes, potassium chloride, pepper, or blends of herbs and spices to enhance the flavor of your food.  Foods that contain the most salt include: processed meats (like ham, bacon, lunch meat, sausage, hot dogs, and breakfast meat), chips, pretzels, salted nuts, soups, salty snacks, canned foods, junk  food, fast food, restaurant food, mustard, pickles, pizza, popcorn, soy sauce, and worcestershire sauce--quite a list!  You might ask, "Is there anything I can eat?"  The answer is, "yes."  Fruits and vegetables are usually low in salt.  Fresh is better than frozen which is better than canned.  If you have canned vegetables, you can cut down on the salt content by rinsing them in tap water 3 times before cooking.   ° °· Weight loss is the second thing you can do to lower your blood pressure.  Getting to and maintaining ideal weight will often normalize your blood pressure and allow you to avoid medications, entirely, cut way down on your dosage of medications, or allow to wean off your meds.  (Note, this should only be done under the supervision of your primary care doctor.)  Of course, weight loss takes time and you may need to be on medication in the meantime.  You shoot for a body mass index of 20-25.  When you go to the urgent care or to your primary care doctor, they should calculate your BMI.  If you don't know what it is, ask.  You can calculate your BMI with the following formula:  Weight in pounds x 703/ (height in inches) x (height in inches).  There are many good diets out there: Weight Watchers and the D.A.S.H. Diet are the best, but often, just modifying a few factors can be helpful:  Don't skip meals, don't eat out, and keeping a food diary.  I do not recommend   fad diets or diet pills which often raise blood pressure.  ° °· Everyone should get regular exercise, but this is particularly important for people with high blood pressure.  Just about any exercise is good.  The only exercise which may be harmful is lifting extreme heavy weights.  I recommend moderate exercise such as walking for 30 minutes 5 days a week.  Going to the gym for a 50 minute workout 3 times a week is also good.  This amounts to 150 minutes of exercise weekly. ° °· Anyone with high blood pressure should avoid any use of tobacco.   Tobacco use does not elevate blood pressure, but it increases the risk of heart disease and stroke.  If you are interested in quitting, discuss with your doctor how to quit.  If you are not interested in quitting, ask yourself, "What would my life be like in 10 years if I continue to smoke?"  "How will I know when it is time to quit?"  "How would my life be better if I were to quit." ° °· Excess alcohol intake can raise the blood pressure.  The safe alcohol intake is 2 drinks or less per day for men and 1 drink per day or less for women. ° °· There is a very good diet which I recommend that has been designed for people with blood pressure called the D.A.S.H. Diet (dietary approaches to stop hypertension).  It consists of fruits, vegetables, lean meats, low fat dairy, whole grains, nuts and seeds.  It is very low in salt and sodium.  It has also been found to have other beneficial health effects such as lowering cholesterol and helping lose weight.  It has been developed by the National Institutes of Health and can be downloaded from the internet without any cost. Just do a web search on "D.A.S.H. Diet." or go the NIH website (www.nih.gov).  There are also cookbooks and diet plans that can be gotten from [employer] to help you with this diet. ° °DASH Eating Plan °DASH stands for "Dietary Approaches to Stop Hypertension." The DASH eating plan is a healthy eating plan that has been shown to reduce high blood pressure (hypertension). Additional health benefits may include reducing the risk of type 2 diabetes mellitus, heart disease, and stroke. The DASH eating plan may also help with weight loss. °WHAT DO I NEED TO KNOW ABOUT THE DASH EATING PLAN? °For the DASH eating plan, you will follow these general guidelines: °· Choose foods with a percent daily value for sodium of less than 5% (as listed on the food label). °· Use salt-free seasonings or herbs instead of table salt or sea salt. °· Check with your health care provider  or pharmacist before using salt substitutes. °· Eat lower-sodium products, often labeled as "lower sodium" or "no salt added." °· Eat fresh foods. °· Eat more vegetables, fruits, and low-fat dairy products. °· Choose whole grains. Look for the word "whole" as the first word in the ingredient list. °· Choose fish and skinless chicken or turkey more often than red meat. Limit fish, poultry, and meat to 6 oz (170 g) each day. °· Limit sweets, desserts, sugars, and sugary drinks. °· Choose heart-healthy fats. °· Limit cheese to 1 oz (28 g) per day. °· Eat more home-cooked food and less restaurant, buffet, and fast food. °· Limit fried foods. °· Cook foods using methods other than frying. °· Limit canned vegetables. If you do use them, rinse them well to decrease   the sodium. °· When eating at a restaurant, ask that your food be prepared with less salt, or no salt if possible. °WHAT FOODS CAN I EAT? °Seek help from a dietitian for individual calorie needs. °Grains °Whole grain or whole wheat bread. Brown rice. Whole grain or whole wheat pasta. Quinoa, bulgur, and whole grain cereals. Low-sodium cereals. Corn or whole wheat flour tortillas. Whole grain cornbread. Whole grain crackers. Low-sodium crackers. °Vegetables °Fresh or frozen vegetables (raw, steamed, roasted, or grilled). Low-sodium or reduced-sodium tomato and vegetable juices. Low-sodium or reduced-sodium tomato sauce and paste. Low-sodium or reduced-sodium canned vegetables.  °Fruits °All fresh, canned (in natural juice), or frozen fruits. °Meat and Other Protein Products °Ground beef (85% or leaner), grass-fed beef, or beef trimmed of fat. Skinless chicken or turkey. Ground chicken or turkey. Pork trimmed of fat. All fish and seafood. Eggs. Dried beans, peas, or lentils. Unsalted nuts and seeds. Unsalted canned beans. °Dairy °Low-fat dairy products, such as skim or 1% milk, 2% or reduced-fat cheeses, low-fat ricotta or cottage cheese, or plain low-fat yogurt.  Low-sodium or reduced-sodium cheeses. °Fats and Oils °Tub margarines without trans fats. Light or reduced-fat mayonnaise and salad dressings (reduced sodium). Avocado. Safflower, olive, or canola oils. Natural peanut or almond butter. °Other °Unsalted popcorn and pretzels. °The items listed above may not be a complete list of recommended foods or beverages. Contact your dietitian for more options. °WHAT FOODS ARE NOT RECOMMENDED? °Grains °White bread. White pasta. White rice. Refined cornbread. Bagels and croissants. Crackers that contain trans fat. °Vegetables °Creamed or fried vegetables. Vegetables in a cheese sauce. Regular canned vegetables. Regular canned tomato sauce and paste. Regular tomato and vegetable juices. °Fruits °Dried fruits. Canned fruit in light or heavy syrup. Fruit juice. °Meat and Other Protein Products °Fatty cuts of meat. Ribs, chicken wings, bacon, sausage, bologna, salami, chitterlings, fatback, hot dogs, bratwurst, and packaged luncheon meats. Salted nuts and seeds. Canned beans with salt. °Dairy °Whole or 2% milk, cream, half-and-half, and cream cheese. Whole-fat or sweetened yogurt. Full-fat cheeses or blue cheese. Nondairy creamers and whipped toppings. Processed cheese, cheese spreads, or cheese curds. °Condiments °Onion and garlic salt, seasoned salt, table salt, and sea salt. Canned and packaged gravies. Worcestershire sauce. Tartar sauce. Barbecue sauce. Teriyaki sauce. Soy sauce, including reduced sodium. Steak sauce. Fish sauce. Oyster sauce. Cocktail sauce. Horseradish. Ketchup and mustard. Meat flavorings and tenderizers. Bouillon cubes. Hot sauce. Tabasco sauce. Marinades. Taco seasonings. Relishes. °Fats and Oils °Butter, stick margarine, lard, shortening, ghee, and bacon fat. Coconut, palm kernel, or palm oils. Regular salad dressings. °Other °Pickles and olives. Salted popcorn and pretzels. °The items listed above may not be a complete list of foods and beverages to avoid.  Contact your dietitian for more information. °WHERE CAN I FIND MORE INFORMATION? °National Heart, Lung, and [employer] Institute: www.nhlbi.nih.gov/health/health-topics/topics/dash/ °Document Released: 09/10/2011 Document Revised: 02/05/2014 Document Reviewed: 07/26/2013 °Exit[employer]® Patient Information ©2015 [employer]. This information is not intended to replace advice given to you by your health care provider. Make sure you discuss any questions you have with your health care provider. ° °

## 2014-12-09 ENCOUNTER — Emergency Department (HOSPITAL_COMMUNITY)
Admission: EM | Admit: 2014-12-09 | Discharge: 2014-12-09 | Disposition: A | Discharge status: Home / Self Care | Payer: Medicaid Other | Attending: Emergency Medicine | Admitting: Emergency Medicine

## 2014-12-09 ENCOUNTER — Encounter (HOSPITAL_COMMUNITY): Payer: Self-pay | Admitting: Emergency Medicine

## 2014-12-09 ENCOUNTER — Emergency Department (HOSPITAL_COMMUNITY): Payer: Medicaid Other

## 2014-12-09 DIAGNOSIS — I10 Essential (primary) hypertension: Secondary | ICD-10-CM | POA: Diagnosis not present

## 2014-12-09 DIAGNOSIS — J45901 Unspecified asthma with (acute) exacerbation: Secondary | ICD-10-CM | POA: Insufficient documentation

## 2014-12-09 DIAGNOSIS — Z3202 Encounter for pregnancy test, result negative: Secondary | ICD-10-CM | POA: Diagnosis not present

## 2014-12-09 DIAGNOSIS — Z79899 Other long term (current) drug therapy: Secondary | ICD-10-CM | POA: Insufficient documentation

## 2014-12-09 DIAGNOSIS — R002 Palpitations: Secondary | ICD-10-CM | POA: Insufficient documentation

## 2014-12-09 DIAGNOSIS — R079 Chest pain, unspecified: Secondary | ICD-10-CM | POA: Diagnosis present

## 2014-12-09 LAB — CBC
HCT: 40.8 % (ref 36.0–46.0)
Hemoglobin: 14.1 g/dL (ref 12.0–15.0)
MCH: 28.9 pg (ref 26.0–34.0)
MCHC: 34.6 g/dL (ref 30.0–36.0)
MCV: 83.6 fL (ref 78.0–100.0)
Platelets: 391 10*3/uL (ref 150–400)
RBC: 4.88 MIL/uL (ref 3.87–5.11)
RDW: 13.3 % (ref 11.5–15.5)
WBC: 10.3 10*3/uL (ref 4.0–10.5)

## 2014-12-09 LAB — I-STAT CHEM 8, ED
BUN: 24 mg/dL — ABNORMAL HIGH (ref 6–23)
CALCIUM ION: 1.11 mmol/L — AB (ref 1.12–1.23)
CHLORIDE: 98 mmol/L (ref 96–112)
CREATININE: 1 mg/dL (ref 0.50–1.10)
Glucose, Bld: 94 mg/dL (ref 70–99)
HEMATOCRIT: 46 % (ref 36.0–46.0)
HEMOGLOBIN: 15.6 g/dL — AB (ref 12.0–15.0)
POTASSIUM: 3.6 mmol/L (ref 3.5–5.1)
SODIUM: 137 mmol/L (ref 135–145)
TCO2: 23 mmol/L (ref 0–100)

## 2014-12-09 LAB — I-STAT TROPONIN, ED: Troponin i, poc: 0.01 ng/mL (ref 0.00–0.08)

## 2014-12-09 LAB — PREGNANCY, URINE: Preg Test, Ur: NEGATIVE

## 2014-12-09 LAB — POC URINE PREG, ED: Preg Test, Ur: NEGATIVE

## 2014-12-09 LAB — D-DIMER, QUANTITATIVE (NOT AT ARMC): D-Dimer, Quant: <0.27 ug/mL-FEU (ref 0.00–0.48)

## 2014-12-09 NOTE — ED Provider Notes (Signed)
CSN: [id number]     Arrival date & time 12/09/14  9562 History   First MD Initiated Contact with Patient 12/09/14 1009     Chief Complaint  Patient presents with  . Chest Pain  . Shortness of Breath     (Consider location/radiation/quality/duration/timing/severity/associated sxs/prior Treatment) HPI Emma Page is a 29 y.o. female who presents to emergency department complaining of palpitations and chest pain. States symptoms began 4 days ago. States "I feel it will heart is racing and a burning sensation in my chest that is worse with coughing." States when her symptoms began she thought was related to her blood pressure, she went to urgent care, where she did have elevated blood pressure and she was prescribed hydrochlorothiazide. She states she has been taking it since but her symptoms continued. She states burning in the chest is right in the center, does not radiate. States constantly feels like her heart is racing. She deneis any new supplements. States she has been taking garcenia combodia "for months" but has not taken them in the last week. Also states she has been on probiotic. Had "herbal" tea once last week but none since. Denies any other otc supplements. Taking tylenol for her pain with no improvement. States she is having dry, non productive cough. No other flu like symptoms.   Past Medical History  Diagnosis Date  . Hypertension   . Asthma    Past Surgical History  Procedure Laterality Date  . Dilation and curettage of uterus    . Abortion    . Eye surgery Right     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   Family History  Problem Relation Age of Onset  . Anesthesia problems Neg Hx   . Cancer Maternal Grandmother   . Heart disease Maternal Grandmother   . Cancer Paternal Grandmother   . Diabetes Paternal Grandfather   . Hypertension Father    History  Substance Use Topics  . Smoking status: Never Smoker   . Smokeless tobacco: Not on file  . Alcohol Use: Yes    Comment: occasional-socially   OB History    Gravida Para Term Preterm AB TAB SAB Ectopic Multiple Living   0 Review of Systems  Constitutional: Negative for fever and chills.  Respiratory: Positive for cough, chest tightness and shortness of breath.   Cardiovascular: Positive for chest pain and palpitations. Negative for leg swelling.  Gastrointestinal: Negative for nausea, vomiting, abdominal pain and diarrhea.  Musculoskeletal: Negative for myalgias, arthralgias, neck pain and neck stiffness.  Skin: Negative for rash.  Neurological: Negative for dizziness, weakness and headaches.  All other systems reviewed and are negative.     Allergies  Review of patient's allergies indicates no known allergies.  Home Medications   Prior to Admission medications   Medication Sig Start Date End Date Taking? Authorizing Provider  albuterol (PROVENTIL HFA;VENTOLIN HFA) 108 (90 BASE) MCG/ACT inhaler Inhale 2 puffs into the lungs every 6 (six) hours as needed. For shortness of breath.    Historical Provider, MD  fluconazole (DIFLUCAN) 150 MG tablet Take 1 tablet (150 mg total) by mouth once. 09/11/14   Jean Rosenthal, NP  hydrochlorothiazide (HYDRODIURIL) 25 MG tablet Take 1 tablet (25 mg total) by mouth daily. 12/05/14   Reuben Likes, MD  Probiotic Product (PROBIOTIC DAILY PO) Take by mouth.    Historical Provider, MD   BP 131/94 mmHg  Pulse 100  Temp(Src) 98.2 F (36.8 C) (Oral)  Resp 17  Ht 5\' 5"  (1.651 m)  Wt 193 lb (87.544 kg)  BMI 32.12 kg/m2  SpO2 96%  LMP 11/21/2014 Physical Exam  Constitutional: She is oriented to person, place, and time. She appears well-developed and well-nourished. No distress.  HENT:  Head: Normocephalic.  Eyes: Conjunctivae are normal.  Neck: Neck supple.  Cardiovascular: Normal rate, regular rhythm and normal heart sounds.   Pulmonary/Chest: Effort normal and breath sounds normal. No respiratory distress. She has no wheezes. She  has no rales. She exhibits no tenderness.  Abdominal: Soft. Bowel sounds are normal. She exhibits no distension. There is no tenderness. There is no rebound.  Musculoskeletal: She exhibits no edema.  No calf pain or tenderness  Neurological: She is alert and oriented to person, place, and time.  Skin: Skin is warm and dry.  Psychiatric: She has a normal mood and affect. Her behavior is normal.  Nursing note and vitals reviewed.   ED Course  Procedures (including critical care time) Labs Review Labs Reviewed  I-STAT CHEM 8, ED - Abnormal; Notable for the following:    BUN 24 (*)    Calcium, Ion 1.11 (*)    Hemoglobin 15.6 (*)    All other components within normal limits  CBC  PREGNANCY, URINE  D-DIMER, QUANTITATIVE  I-STAT TROPOININ, ED  POC URINE PREG, ED  I-STAT TROPOININ, ED    Imaging Review Dg Chest 2 View  12/09/2014   CLINICAL DATA:  Chest pain and shortness of breath for 4 days. History of asthma and hypertension.  EXAM: CHEST  2 VIEW  COMPARISON:  CT 05/02/2011  FINDINGS: Midline trachea.  Normal heart size and mediastinal contours.  Sharp costophrenic angles.  No pneumothorax.  Clear lungs.  Lateral view degraded by patient arm position.  IMPRESSION: No active cardiopulmonary disease.   Electronically Signed   By: Jeronimo GreavesKyle  Talbot M.D.   On: 12/09/2014 14:00     EKG Interpretation None      MDM   Final diagnoses:  Palpitations   Pt with chest "burning", palpitations, sob, cough for last 4-5 days. Will get labs, including d dimer, CXR. VS nromal at this time.   Filed Vitals:   12/09/14 1000  BP: 131/94  Pulse: 100  Temp: 98.2 F (36.8 C)  TempSrc: Oral  Resp: 17  Height: 5\' 5"  (1.651 m)  Weight: 193 lb (87.544 kg)  SpO2: 96%    2:29 PM Pt's labs including d dimer and trop negative. Pt is low risk for ACS. Heart score 1. Pt continues to feel palpitations, currently in sinus rhythm, Heart rate in 80-90s. Discussed possibility of anxiety, pt admits that she  thinks it could be related to anxiety. Will follow up with cardiology for further evaluation if symptoms continue. Also follow up with pcp for thyroid studies and anxiety evaluation. Return precautions discussed.   Filed Vitals:   12/09/14 1330 12/09/14 1345 12/09/14 1400 12/09/14 1405  BP: 139/96 113/79 125/94 125/94  Pulse: 104 89 91 102  Temp:      TempSrc:      Resp: 19 12 11 18   Height:      Weight:      SpO2: 100% 100% 99% 100%     Lottie Musselatyana A Liala Codispoti, PA-C 12/09/14 1432  Raeford RazorStephen Kohut, MD 12/09/14 1446

## 2014-12-09 NOTE — ED Notes (Signed)
Pt leaving for XRay. 

## 2014-12-09 NOTE — ED Notes (Signed)
Back from XRay

## 2014-12-09 NOTE — ED Notes (Signed)
Pt. Stated, I've had chest pain and SOB  Since Wed cause my BP up and give HCTZ and my heart is racing

## 2014-12-09 NOTE — ED Notes (Signed)
NAD at this time. Pt is stable and leaving with her friend. 

## 2014-12-09 NOTE — Discharge Instructions (Signed)
Avoid stressors. Rest. Make sure to drink plenty of fluids. Follow up with cardiology if continue to have problems. Return if symptoms are worsening.   Palpitations A palpitation is the feeling that your heartbeat is irregular or is faster than normal. It may feel like your heart is fluttering or skipping a beat. Palpitations are usually not a serious problem. However, in some cases, you may need further medical evaluation. CAUSES  Palpitations can be caused by:  Smoking.  Caffeine or other stimulants, such as diet pills or energy drinks.  Alcohol.  Stress and anxiety.  Strenuous physical activity.  Fatigue.  Certain medicines.  Heart disease, especially if you have a history of irregular heart rhythms (arrhythmias), such as atrial fibrillation, atrial flutter, or supraventricular tachycardia.  An improperly working pacemaker or defibrillator. DIAGNOSIS  To find the cause of your palpitations, your health care provider will take your medical history and perform a physical exam. Your health care provider may also have you take a test called an ambulatory electrocardiogram (ECG). An ECG records your heartbeat patterns over a 24-hour period. You may also have other tests, such as:  Transthoracic echocardiogram (TTE). During echocardiography, sound waves are used to evaluate how blood flows through your heart.  Transesophageal echocardiogram (TEE).  Cardiac monitoring. This allows your health care provider to monitor your heart rate and rhythm in real time.  Holter monitor. This is a portable device that records your heartbeat and can help diagnose heart arrhythmias. It allows your health care provider to track your heart activity for several days, if needed.  Stress tests by exercise or by giving medicine that makes the heart beat faster. TREATMENT  Treatment of palpitations depends on the cause of your symptoms and can vary greatly. Most cases of palpitations do not require any  treatment other than time, relaxation, and monitoring your symptoms. Other causes, such as atrial fibrillation, atrial flutter, or supraventricular tachycardia, usually require further treatment. HOME CARE INSTRUCTIONS   Avoid:  Caffeinated coffee, tea, soft drinks, diet pills, and energy drinks.  Chocolate.  Alcohol.  Stop smoking if you smoke.  Reduce your stress and anxiety. Things that can help you relax include:  A method of controlling things in your body, such as your heartbeats, with your mind (biofeedback).  Yoga.  Meditation.  Physical activity such as swimming, jogging, or walking.  Get plenty of rest and sleep. SEEK MEDICAL CARE IF:   You continue to have a fast or irregular heartbeat beyond 24 hours.  Your palpitations occur more often. SEEK IMMEDIATE MEDICAL CARE IF:  You have chest pain or shortness of breath.  You have a severe headache.  You feel dizzy or you faint. MAKE SURE YOU:  Understand these instructions.  Will watch your condition.  Will get help right away if you are not doing well or get worse. Document Released: 09/18/2000 Document Revised: 09/26/2013 Document Reviewed: 11/20/2011 [facility] Center[facility] Patient Information 2015 PlatoExitCare, MarylandLLC. This information is not intended to replace advice given to you by your health care provider. Make sure you discuss any questions you have with your health care provider.

## 2015-02-27 ENCOUNTER — Inpatient Hospital Stay (HOSPITAL_COMMUNITY)
Admission: AD | Admit: 2015-02-27 | Discharge: 2015-02-27 | Disposition: A | Discharge status: Home / Self Care | Payer: Medicaid Other | Source: Ambulatory Visit | Attending: Obstetrics & Gynecology | Admitting: Obstetrics & Gynecology

## 2015-02-27 ENCOUNTER — Encounter (HOSPITAL_COMMUNITY): Payer: Self-pay | Admitting: *Deleted

## 2015-02-27 DIAGNOSIS — I10 Essential (primary) hypertension: Secondary | ICD-10-CM | POA: Insufficient documentation

## 2015-02-27 DIAGNOSIS — N898 Other specified noninflammatory disorders of vagina: Secondary | ICD-10-CM | POA: Diagnosis present

## 2015-02-27 DIAGNOSIS — B373 Candidiasis of vulva and vagina: Secondary | ICD-10-CM | POA: Insufficient documentation

## 2015-02-27 DIAGNOSIS — Z8249 Family history of ischemic heart disease and other diseases of the circulatory system: Secondary | ICD-10-CM | POA: Diagnosis not present

## 2015-02-27 DIAGNOSIS — J45909 Unspecified asthma, uncomplicated: Secondary | ICD-10-CM | POA: Diagnosis not present

## 2015-02-27 DIAGNOSIS — B3731 Acute candidiasis of vulva and vagina: Secondary | ICD-10-CM

## 2015-02-27 LAB — URINALYSIS, ROUTINE W REFLEX MICROSCOPIC
Bilirubin Urine: NEGATIVE
Glucose, UA: NEGATIVE mg/dL
Ketones, ur: 15 mg/dL — AB
LEUKOCYTES UA: NEGATIVE
NITRITE: NEGATIVE
Protein, ur: NEGATIVE mg/dL
Urobilinogen, UA: 1 mg/dL (ref 0.0–1.0)
pH: 6 (ref 5.0–8.0)

## 2015-02-27 LAB — URINE MICROSCOPIC-ADD ON: WBC UA: NONE SEEN WBC/hpf (ref <3)

## 2015-02-27 LAB — WET PREP, GENITAL
Clue Cells Wet Prep HPF POC: NONE SEEN
Trich, Wet Prep: NONE SEEN
WBC, Wet Prep HPF POC: NONE SEEN
Yeast Wet Prep HPF POC: NONE SEEN

## 2015-02-27 LAB — POCT PREGNANCY, URINE: PREG TEST UR: NEGATIVE

## 2015-02-27 MED ORDER — FLUCONAZOLE 150 MG PO TABS: 150.0000 mg | ORAL_TABLET | Freq: Once | ORAL | Status: DC

## 2015-02-27 NOTE — Discharge Instructions (Signed)

## 2015-02-27 NOTE — MAU Note (Signed)
Pt states she has had a white vaginal discharge and vaginal irritation x 3 days.

## 2015-02-27 NOTE — MAU Provider Note (Signed)
History     CSN: [id number]  Arrival date and time: 02/27/15 2100   None     No chief complaint on file.  Vaginal Discharge The patient's primary symptoms include genital itching and vaginal discharge. This is a new problem. The current episode started in the past 7 days. The problem occurs constantly. The problem has been unchanged. The patient is experiencing no pain. She is not pregnant. Pertinent negatives include no abdominal pain, constipation, diarrhea, dysuria, fever, frequency, nausea, urgency or vomiting. The vaginal discharge was white. There has been no bleeding. Nothing aggravates the symptoms. She has tried nothing for the symptoms. Contraceptive use: "I take birth control pills whenever I decided to have sex" Her menstrual history has been regular.    Past Medical History  Diagnosis Date  . Hypertension   . Asthma     Past Surgical History  Procedure Laterality Date  . Dilation and curettage of uterus    . Abortion    . Eye surgery Right     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    Family History  Problem Relation Age of Onset  . Anesthesia problems Neg Hx   . Cancer Maternal Grandmother   . Heart disease Maternal Grandmother   . Cancer Paternal Grandmother   . Diabetes Paternal Grandfather   . Hypertension Father     History  Substance Use Topics  . Smoking status: Never Smoker   . Smokeless tobacco: Not on file  . Alcohol Use: Yes     Comment: occasional-socially    Allergies: No Known Allergies  Prescriptions prior to admission  Medication Sig Dispense Refill Last Dose  . albuterol (PROVENTIL HFA;VENTOLIN HFA) 108 (90 BASE) MCG/ACT inhaler Inhale 2 puffs into the lungs every 6 (six) hours as needed (for emergency asthma attacks). For shortness of breath.   Past Month at Unknown time  . aspirin-acetaminophen-caffeine (EXCEDRIN MIGRAINE) 250-250-65 MG per tablet Take 1 tablet by mouth every 6 (six) hours as needed for migraine.   Past Week at Unknown time   . hydrochlorothiazide (HYDRODIURIL) 25 MG tablet Take 1 tablet (25 mg total) by mouth daily. 90 tablet 0 02/27/2015 at Unknown time  . ibuprofen (ADVIL,MOTRIN) 600 MG tablet Take 600 mg by mouth every 6 (six) hours as needed for headache or moderate pain (migraines (alternates with excedrin)).   Past Week at Unknown time    Review of Systems  Constitutional: Negative for fever.  Gastrointestinal: Negative for nausea, vomiting, abdominal pain, diarrhea and constipation.  Genitourinary: Positive for vaginal discharge. Negative for dysuria, urgency and frequency.   Physical Exam   Blood pressure 135/94, pulse 101, temperature 98 F (36.7 C), temperature source Oral, resp. rate 16, height 5\' 5"  (1.651 m), weight 91.173 kg (201 lb), last menstrual period 07/12/2014, SpO2 100 %, unknown if currently breastfeeding.  Physical Exam  Nursing note and vitals reviewed. Constitutional: She is oriented to person, place, and time. She appears well-developed and well-nourished. No distress.  Cardiovascular: Normal rate.   Respiratory: Effort normal.  GI: Soft. There is no tenderness. There is no rebound.  Neurological: She is alert and oriented to person, place, and time.  Skin: Skin is warm and dry.  Psychiatric: She has a normal mood and affect.   Results for orders placed or performed during the hospital encounter of 02/27/15 (from the past 24 hour(s))  Urinalysis, Routine w reflex microscopic     Status: Abnormal   Collection Time: 02/27/15  9:10 PM  Result Value Ref  Range   Color, Urine YELLOW YELLOW   APPearance CLEAR CLEAR   Specific Gravity, Urine >1.030 (H) 1.005 - 1.030   pH 6.0 5.0 - 8.0   Glucose, UA NEGATIVE NEGATIVE mg/dL   Hgb urine dipstick TRACE (A) NEGATIVE   Bilirubin Urine NEGATIVE NEGATIVE   Ketones, ur 15 (A) NEGATIVE mg/dL   Protein, ur NEGATIVE NEGATIVE mg/dL   Urobilinogen, UA 1.0 0.0 - 1.0 mg/dL   Nitrite NEGATIVE NEGATIVE   Leukocytes, UA NEGATIVE NEGATIVE  Urine  microscopic-add on     Status: Abnormal   Collection Time: 02/27/15  9:10 PM  Result Value Ref Range   Squamous Epithelial / LPF RARE RARE   WBC, UA  <3 WBC/hpf    NO FORMED ELEMENTS SEEN ON URINE MICROSCOPIC EXAMINATION   RBC / HPF 0-2 <3 RBC/hpf   Bacteria, UA FEW (A) RARE  Pregnancy, urine POC     Status: None   Collection Time: 02/27/15  9:19 PM  Result Value Ref Range   Preg Test, Ur NEGATIVE NEGATIVE  Wet prep, genital     Status: None   Collection Time: 02/27/15 10:20 PM  Result Value Ref Range   Yeast Wet Prep HPF POC NONE SEEN NONE SEEN   Trich, Wet Prep NONE SEEN NONE SEEN   Clue Cells Wet Prep HPF POC NONE SEEN NONE SEEN   WBC, Wet Prep HPF POC NONE SEEN NONE SEEN    MAU Course  Procedures  MDM   Assessment and Plan   1. Yeast infection involving the vagina and surrounding area    DC home RX diflucan Return to MAU as needed  Follow-up Information    Follow up with [facility] [place].   Why:  As needed   Contact information:   [address] [facility] [address] [phone]       Tawnya Crook 02/27/2015, 10:39 PM

## 2015-02-28 LAB — CERVICOVAGINAL ANCILLARY ONLY
Chlamydia: NEGATIVE
Neisseria Gonorrhea: NEGATIVE

## 2015-03-15 ENCOUNTER — Inpatient Hospital Stay (HOSPITAL_COMMUNITY)
Admission: AD | Admit: 2015-03-15 | Discharge: 2015-03-15 | Disposition: A | Discharge status: Home / Self Care | Payer: Medicaid Other | Source: Ambulatory Visit | Attending: Obstetrics & Gynecology | Admitting: Obstetrics & Gynecology

## 2015-03-15 ENCOUNTER — Encounter (HOSPITAL_COMMUNITY): Payer: Self-pay | Admitting: *Deleted

## 2015-03-15 ENCOUNTER — Inpatient Hospital Stay (HOSPITAL_COMMUNITY): Payer: Medicaid Other

## 2015-03-15 ENCOUNTER — Inpatient Hospital Stay (HOSPITAL_COMMUNITY): Payer: Self-pay

## 2015-03-15 DIAGNOSIS — R109 Unspecified abdominal pain: Secondary | ICD-10-CM

## 2015-03-15 DIAGNOSIS — Z3201 Encounter for pregnancy test, result positive: Secondary | ICD-10-CM

## 2015-03-15 DIAGNOSIS — B9689 Other specified bacterial agents as the cause of diseases classified elsewhere: Secondary | ICD-10-CM

## 2015-03-15 DIAGNOSIS — N898 Other specified noninflammatory disorders of vagina: Secondary | ICD-10-CM

## 2015-03-15 DIAGNOSIS — O26891 Other specified pregnancy related conditions, first trimester: Secondary | ICD-10-CM

## 2015-03-15 DIAGNOSIS — R300 Dysuria: Secondary | ICD-10-CM | POA: Insufficient documentation

## 2015-03-15 DIAGNOSIS — A499 Bacterial infection, unspecified: Secondary | ICD-10-CM

## 2015-03-15 DIAGNOSIS — O26899 Other specified pregnancy related conditions, unspecified trimester: Secondary | ICD-10-CM

## 2015-03-15 DIAGNOSIS — N760 Acute vaginitis: Secondary | ICD-10-CM | POA: Insufficient documentation

## 2015-03-15 LAB — WET PREP, GENITAL
Trich, Wet Prep: NONE SEEN
YEAST WET PREP: NONE SEEN

## 2015-03-15 LAB — CBC WITH DIFFERENTIAL/PLATELET
Basophils Absolute: 0 10*3/uL (ref 0.0–0.1)
Basophils Relative: 0 % (ref 0–1)
EOS PCT: 6 % — AB (ref 0–5)
Eosinophils Absolute: 0.6 10*3/uL (ref 0.0–0.7)
HEMATOCRIT: 33.9 % — AB (ref 36.0–46.0)
Hemoglobin: 11.8 g/dL — ABNORMAL LOW (ref 12.0–15.0)
LYMPHS ABS: 3.2 10*3/uL (ref 0.7–4.0)
Lymphocytes Relative: 34 % (ref 12–46)
MCH: 29.1 pg (ref 26.0–34.0)
MCHC: 34.8 g/dL (ref 30.0–36.0)
MCV: 83.5 fL (ref 78.0–100.0)
MONO ABS: 0.6 10*3/uL (ref 0.1–1.0)
Monocytes Relative: 6 % (ref 3–12)
NEUTROS PCT: 54 % (ref 43–77)
Neutro Abs: 5 10*3/uL (ref 1.7–7.7)
Platelets: 368 10*3/uL (ref 150–400)
RBC: 4.06 MIL/uL (ref 3.87–5.11)
RDW: 13.3 % (ref 11.5–15.5)
WBC: 9.4 10*3/uL (ref 4.0–10.5)

## 2015-03-15 LAB — URINALYSIS, ROUTINE W REFLEX MICROSCOPIC
Bilirubin Urine: NEGATIVE
Glucose, UA: NEGATIVE mg/dL
HGB URINE DIPSTICK: NEGATIVE
Ketones, ur: NEGATIVE mg/dL
Leukocytes, UA: NEGATIVE
NITRITE: NEGATIVE
PH: 5.5 (ref 5.0–8.0)
Protein, ur: NEGATIVE mg/dL
SPECIFIC GRAVITY, URINE: 1.025 (ref 1.005–1.030)
UROBILINOGEN UA: 0.2 mg/dL (ref 0.0–1.0)

## 2015-03-15 LAB — HCG, QUANTITATIVE, PREGNANCY: HCG, BETA CHAIN, QUANT, S: 2270 m[IU]/mL — AB (ref <5)

## 2015-03-15 LAB — POCT PREGNANCY, URINE: Preg Test, Ur: POSITIVE — AB

## 2015-03-15 LAB — ABO/RH: ABO/RH(D): A NEG

## 2015-03-15 MED ORDER — METRONIDAZOLE 500 MG PO TABS: 500.0000 mg | ORAL_TABLET | Freq: Two times a day (BID) | ORAL | Status: DC

## 2015-03-15 MED ORDER — ACETAMINOPHEN 325 MG PO TABS
650.0000 mg | ORAL_TABLET | Freq: Once | ORAL | Status: AC
  Administered 2015-03-15: 650 mg via ORAL
  Filled 2015-03-15: qty 2

## 2015-03-15 NOTE — MAU Provider Note (Signed)
History     CSN: [id number]  Arrival date and time: 03/15/15 1237   First Provider Initiated Contact with Patient 03/15/15 1408      No chief complaint on file.  HPI Emma Page is 29 y.o. [id number] [redacted]w[redacted]d weeks presenting with cramping and dysuria that began that began 3 days ago.  Denies hematuria.  Takes High BP med and reports frequency is normal for her.  LMP  02/10/15.  Sexually active.  Does not use OCPs correctly.  She had not idea she was pregnancy. She feels our test is wrong, even thought June's period is late.   She has a vaginal discharge with odor not foul.  Neg for vaginal bleeding.    Past Medical History  Diagnosis Date  . Hypertension   . Asthma     Past Surgical History  Procedure Laterality Date  . Dilation and curettage of uterus    . Abortion    . Eye surgery Right     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    Family History  Problem Relation Age of Onset  . Anesthesia problems Neg Hx   . Cancer Maternal Grandmother   . Heart disease Maternal Grandmother   . Cancer Paternal Grandmother   . Diabetes Paternal Grandfather   . Hypertension Father     History  Substance Use Topics  . Smoking status: Never Smoker   . Smokeless tobacco: Not on file  . Alcohol Use: Yes     Comment: occasional-socially    Allergies: No Known Allergies  Prescriptions prior to admission  Medication Sig Dispense Refill Last Dose  . aspirin-acetaminophen-caffeine (EXCEDRIN MIGRAINE) 250-250-65 MG per tablet Take 1 tablet by mouth every 6 (six) hours as needed for migraine.   03/14/2015 at Unknown time  . hydrochlorothiazide (HYDRODIURIL) 25 MG tablet Take 1 tablet (25 mg total) by mouth daily. 90 tablet 0 03/15/2015 at Unknown time  . albuterol (PROVENTIL HFA;VENTOLIN HFA) 108 (90 BASE) MCG/ACT inhaler Inhale 2 puffs into the lungs every 6 (six) hours as needed (for emergency asthma attacks). For shortness of breath.   rescue    Review of Systems  Constitutional: Negative  for fever and chills.  Respiratory: Negative for shortness of breath.   Cardiovascular: Negative for chest pain.  Gastrointestinal: Positive for abdominal pain (cramping). Negative for nausea and vomiting.  Genitourinary: Positive for dysuria. Negative for urgency, frequency and hematuria.       + for vaginal discharge  Musculoskeletal: Negative for back pain.  Neurological: Negative for headaches.   Physical Exam   Blood pressure 133/83, pulse 90, temperature 98.3 F (36.8 C), temperature source Oral, resp. rate 18, height  (1.626 m), weight 200 lb (90.719 kg), last menstrual period 02/10/2015, unknown if currently breastfeeding.  Physical Exam  Constitutional: She is oriented to person, place, and time. She appears well-developed and well-nourished. No distress.  HENT:  Head: Normocephalic.  Neck: Normal range of motion.  Cardiovascular: Normal rate.   Respiratory: Effort normal.  GI: Soft. She exhibits no distension and no mass. There is no tenderness. There is no rebound and no guarding.  Genitourinary: There is no rash, tenderness or lesion on the right labia. There is no rash, tenderness or lesion on the left labia. Uterus is not enlarged and not tender. Cervix exhibits no motion tenderness, no discharge and no friability. Right adnexum displays no mass, no tenderness and no fullness. Left adnexum displays no mass, no tenderness and no fullness. No erythema, tenderness or bleeding  in the vagina. Vaginal discharge (small amount of frothy discharge with odor) found.  Neurological: She is alert and oriented to person, place, and time.  Skin: Skin is warm and dry.  Psychiatric: She has a normal mood and affect. Her behavior is normal.    Results for orders placed or performed during the hospital encounter of 03/15/15 (from the past 24 hour(s))  Urinalysis, Routine w reflex microscopic (not at [facility])     Status: None   Collection Time: 03/15/15  1:10 PM  Result Value Ref Range    Color, Urine YELLOW YELLOW   APPearance CLEAR CLEAR   Specific Gravity, Urine 1.025 1.005 - 1.030   pH 5.5 5.0 - 8.0   Glucose, UA NEGATIVE NEGATIVE mg/dL   Hgb urine dipstick NEGATIVE NEGATIVE   Bilirubin Urine NEGATIVE NEGATIVE   Ketones, ur NEGATIVE NEGATIVE mg/dL   Protein, ur NEGATIVE NEGATIVE mg/dL   Urobilinogen, UA 0.2 0.0 - 1.0 mg/dL   Nitrite NEGATIVE NEGATIVE   Leukocytes, UA NEGATIVE NEGATIVE  Pregnancy, urine POC     Status: Abnormal   Collection Time: 03/15/15  1:23 PM  Result Value Ref Range   Preg Test, Ur POSITIVE (A) NEGATIVE  Wet prep, genital     Status: Abnormal   Collection Time: 03/15/15  2:15 PM  Result Value Ref Range   Yeast Wet Prep HPF POC NONE SEEN NONE SEEN   Trich, Wet Prep NONE SEEN NONE SEEN   Clue Cells Wet Prep HPF POC MANY (A) NONE SEEN   WBC, Wet Prep HPF POC FEW (A) NONE SEEN  hCG, quantitative, pregnancy     Status: Abnormal   Collection Time: 03/15/15  2:42 PM  Result Value Ref Range   hCG, Beta Chain, Quant, S 2270 (H) <5 mIU/mL  CBC with Differential/Platelet     Status: Abnormal   Collection Time: 03/15/15  2:42 PM  Result Value Ref Range   WBC 9.4 4.0 - 10.5 K/uL   RBC 4.06 3.87 - 5.11 MIL/uL   Hemoglobin 11.8 (L) 12.0 - 15.0 g/dL   HCT 16.1 (L) 09.6 - 04.5 %   MCV 83.5 78.0 - 100.0 fL   MCH 29.1 26.0 - 34.0 pg   MCHC 34.8 30.0 - 36.0 g/dL   RDW 40.9 81.1 - 91.4 %   Platelets 368 150 - 400 K/uL   Neutrophils Relative % 54 43 - 77 %   Neutro Abs 5.0 1.7 - 7.7 K/uL   Lymphocytes Relative 34 12 - 46 %   Lymphs Abs 3.2 0.7 - 4.0 K/uL   Monocytes Relative 6 3 - 12 %   Monocytes Absolute 0.6 0.1 - 1.0 K/uL   Eosinophils Relative 6 (H) 0 - 5 %   Eosinophils Absolute 0.6 0.0 - 0.7 K/uL   Basophils Relative 0 0 - 1 %   Basophils Absolute 0.0 0.0 - 0.1 K/uL  ABO/Rh     Status: None   Collection Time: 03/15/15  2:42 PM  Result Value Ref Range   ABO/RH(D) A NEG     US Ob Transvaginal  03/15/2015   CLINICAL DATA:  Cramping  EXAM:  OBSTETRIC <14 WK [place] AND TRANSVAGINAL OB US  TECHNIQUE: Both transabdominal and transvaginal ultrasound examinations were performed for complete evaluation of the gestation as well as the maternal uterus, adnexal regions, and pelvic cul-de-sac. Transvaginal technique was performed to assess early pregnancy.  COMPARISON:  None.  FINDINGS: Intrauterine gestational sac: Single  Yolk sac:  No  Embryo:  No  Cardiac Activity: No  Heart Rate: Not applicable  bpm  MSD: 5.1  mm   5 w   0  d  Maternal uterus/adnexae:  Subchorionic hemorrhage: None  Right ovary: 3.2 x 1.9 x 2.7 cm.  Left ovary: Not visual  Other :None  Free fluid:  None  IMPRESSION: 1. Probable early intrauterine gestational sac, but no yolk sac, fetal pole, or cardiac activity yet visualized. Recommend follow-up quantitative B-HCG levels and follow-up US in 14 days to confirm and assess viability. This recommendation follows SRU consensus guidelines: Diagnostic Criteria for Nonviable Pregnancy Early in the First Trimester. Malva Limes Med 2013; 174:0814-48.   Electronically Signed   By: Signa Kell M.D.   On: 03/15/2015 16:50   US Ob Transvaginal  03/15/2015   CLINICAL DATA:  Cramping  EXAM: OBSTETRIC <14 WK [place] AND TRANSVAGINAL OB US  TECHNIQUE: Both transabdominal and transvaginal ultrasound examinations were performed for complete evaluation of the gestation as well as the maternal uterus, adnexal regions, and pelvic cul-de-sac. Transvaginal technique was performed to assess early pregnancy.  COMPARISON:  None.  FINDINGS: Intrauterine gestational sac: Single  Yolk sac:  No  Embryo:  No  Cardiac Activity: No  Heart Rate: Not applicable  bpm  MSD: 5.1  mm   5 w   0  d  Maternal uterus/adnexae:  Subchorionic hemorrhage: None  Right ovary: 3.2 x 1.9 x 2.7 cm.  Left ovary: Not visual  Other :None  Free fluid:  None  IMPRESSION: 1. Probable early intrauterine gestational sac, but no yolk sac, fetal pole, or cardiac activity yet visualized. Recommend follow-up  quantitative B-HCG levels and follow-up US in 14 days to confirm and assess viability. This recommendation follows SRU consensus guidelines: Diagnostic Criteria for Nonviable Pregnancy Early in the First Trimester. Malva Limes Med 2013; 185:6314-97.   Electronically Signed   By: Signa Kell M.D.   On: 03/15/2015 16:50   MAU Course  Procedures  GC/CHL culture pending  MDM:  MSE Exam Labs U/S Consulted with Dr. Emelda Fear about use of HCTZ in very early pregnancy.  Order given to D/C HCTZ.  Patient is concerned about being pregnant and states at time of discharge she now has a headache from trying to come to grips with this news.   Tylenol 650mg  po given in MAU Assessment and Plan  A:  Dysuria with normal UA      Cramping      Vaginal discharge      Bacterial vaginosis      Positive pregnancy test  P:  Return for BHCG in 48 hrs      Ectopic precautions given--return for worsening sxs      Rx for Flagyl to pharmacy     Pelvic rest X 1 week.      Patient instructed to D/C HCTZ and if BHCG result appropriate rise on Sunday, begin prenatal care with MD of choice.  Patient instructed to let her PCP know she is pregnancy and HCTZ was discontinued.         KEY,EVE M 03/15/2015, 4:50 PM

## 2015-03-15 NOTE — Discharge Instructions (Signed)
Abdominal Pain During Pregnancy °Abdominal pain is common in pregnancy. Most of the time, it does not cause harm. There are many causes of abdominal pain. Some causes are more serious than others. Some of the causes of abdominal pain in pregnancy are easily diagnosed. Occasionally, the diagnosis takes time to understand. Other times, the cause is not determined. Abdominal pain can be a sign that something is very wrong with the pregnancy, or the pain may have nothing to do with the pregnancy at all. For this reason, always tell your health care provider if you have any abdominal discomfort. °HOME CARE INSTRUCTIONS  °Monitor your abdominal pain for any changes. The following actions may help to alleviate any discomfort you are experiencing: °· Do not have sexual intercourse or put anything in your vagina until your symptoms go away completely. °· Get plenty of rest until your pain improves. °· Drink clear fluids if you feel nauseous. Avoid solid food as long as you are uncomfortable or nauseous. °· Only take over-the-counter or prescription medicine as directed by your health care provider. °· Keep all follow-up appointments with your health care provider. °SEEK IMMEDIATE MEDICAL CARE IF: °· You are bleeding, leaking fluid, or passing tissue from the vagina. °· You have increasing pain or cramping. °· You have persistent vomiting. °· You have painful or bloody urination. °· You have a fever. °· You notice a decrease in your baby's movements. °· You have extreme weakness or feel faint. °· You have shortness of breath, with or without abdominal pain. °· You develop a severe headache with abdominal pain. °· You have abnormal vaginal discharge with abdominal pain. °· You have persistent diarrhea. °· You have abdominal pain that continues even after rest, or gets worse. °MAKE SURE YOU:  °· Understand these instructions. °· Will watch your condition. °· Will get help right away if you are not doing well or get  worse. °Document Released: 09/21/2005 Document Revised: 07/12/2013 Document Reviewed: 04/20/2013 °ExitCare® Patient Information ©2015 Exit[employer], LLC. This information is not intended to replace advice given to you by your health care provider. Make sure you discuss any questions you have with your health care provider. ° °Bacterial Vaginosis °Bacterial vaginosis is an infection of the vagina. It happens when too many of certain germs (bacteria) grow in the vagina. °HOME CARE °· Take your medicine as told by your doctor. °· Finish your medicine even if you start to feel better. °· Do not have sex until you finish your medicine and are better. °· Tell your sex partner that you have an infection. They should see their doctor for treatment. °· Practice safe sex. Use condoms. Have only one sex partner. °GET HELP IF: °· You are not getting better after 3 days of treatment. °· You have more grey fluid (discharge) coming from your vagina than before. °· You have more pain than before. °· You have a fever. °MAKE SURE YOU:  °· Understand these instructions. °· Will watch your condition. °· Will get help right away if you are not doing well or get worse. °Document Released: 06/30/2008 Document Revised: 07/12/2013 Document Reviewed: 05/03/2013 °ExitCare® Patient Information ©2015 ExitCare, LLC. This information is not intended to replace advice given to you by your health care provider. Make sure you discuss any questions you have with your health care provider. ° °

## 2015-03-15 NOTE — MAU Note (Signed)
Having some issues down there.  When she using the bathroom, it feels weird feeling.  Has some d/c and on and off cramps. Hasn't had cycle.

## 2015-03-16 LAB — HIV ANTIBODY (ROUTINE TESTING W REFLEX): HIV SCREEN 4TH GENERATION: NONREACTIVE

## 2015-03-17 ENCOUNTER — Inpatient Hospital Stay (HOSPITAL_COMMUNITY)
Admission: AD | Admit: 2015-03-17 | Discharge: 2015-03-17 | Disposition: A | Discharge status: Home / Self Care | Payer: Self-pay | Source: Ambulatory Visit | Attending: Family Medicine | Admitting: Family Medicine

## 2015-03-17 DIAGNOSIS — O26899 Other specified pregnancy related conditions, unspecified trimester: Secondary | ICD-10-CM

## 2015-03-17 DIAGNOSIS — O9989 Other specified diseases and conditions complicating pregnancy, childbirth and the puerperium: Secondary | ICD-10-CM | POA: Insufficient documentation

## 2015-03-17 DIAGNOSIS — R109 Unspecified abdominal pain: Secondary | ICD-10-CM | POA: Insufficient documentation

## 2015-03-17 DIAGNOSIS — Z3A01 Less than 8 weeks gestation of pregnancy: Secondary | ICD-10-CM | POA: Insufficient documentation

## 2015-03-17 LAB — HCG, QUANTITATIVE, PREGNANCY: HCG, BETA CHAIN, QUANT, S: 4909 m[IU]/mL — AB (ref <5)

## 2015-03-17 NOTE — Discharge Instructions (Signed)

## 2015-03-17 NOTE — MAU Note (Signed)
Pt presents to MAU for repeat quant. Denies any vaginal bleeding, reports lower abdominal cramping.

## 2015-03-17 NOTE — MAU Provider Note (Signed)
.  lhe History   Chief Complaint:  Repeat BHCG    Emma Page is  29 y.o. [id number] Patient's last menstrual period was 02/10/2015.Marland Kitchen Patient is here for follow up of quantitative HCG and ongoing surveillance of pregnancy status.   She is [redacted]w[redacted]d weeks gestation  by LMP.    Since her last visit, the patient is without new complaint.   The patient reports cramping unchanged, mild, low midline and no bleeding.  General ROS:  negative  Her previous Quantitative HCG values are:  2,270 on 02/12/2015. U/S showed IUGS, no yolk sac or fetal pole.      Physical Exam   Blood pressure 127/81, pulse 84, temperature 98.5 F (36.9 C), resp. rate 18, last menstrual period 02/10/2015, not currently breastfeeding.  Focused Gynecological Exam: examination not indicated  Labs: Results for orders placed or performed during the hospital encounter of 03/17/15 (from the past 24 hour(s))  hCG, quantitative, pregnancy   Collection Time: 03/17/15  5:33 PM  Result Value Ref Range   hCG, Beta Chain, Quant, S 4909 (H) <5 mIU/mL    Ultrasound Studies:   US Ob Transvaginal  03/15/2015   CLINICAL DATA:  Cramping  EXAM: OBSTETRIC <14 WK [place] AND TRANSVAGINAL OB US  TECHNIQUE: Both transabdominal and transvaginal ultrasound examinations were performed for complete evaluation of the gestation as well as the maternal uterus, adnexal regions, and pelvic cul-de-sac. Transvaginal technique was performed to assess early pregnancy.  COMPARISON:  None.  FINDINGS: Intrauterine gestational sac: Single  Yolk sac:  No  Embryo:  No  Cardiac Activity: No  Heart Rate: Not applicable  bpm  MSD: 5.1  mm   5 w   0  d  Maternal uterus/adnexae:  Subchorionic hemorrhage: None  Right ovary: 3.2 x 1.9 x 2.7 cm.  Left ovary: Not visual  Other :None  Free fluid:  None  IMPRESSION: 1. Probable early intrauterine gestational sac, but no yolk sac, fetal pole, or cardiac activity yet visualized. Recommend follow-up quantitative B-HCG levels  and follow-up US in 14 days to confirm and assess viability. This recommendation follows SRU consensus guidelines: Diagnostic Criteria for Nonviable Pregnancy Early in the First Trimester. Malva Limes Med 2013; 270:3500-93.   Electronically Signed   By: Signa Kell M.D.   On: 03/15/2015 16:50    Assessment:  [redacted]w[redacted]d weeks gestation     Plan: The patient is instructed to follow up in in 1 week. Radiology will call her to schedule an U/S 03/25/15 Precautions reviewed  Emma Page. 03/17/2015, 6:58 PM

## 2015-03-18 LAB — GC/CHLAMYDIA PROBE AMP (~~LOC~~) NOT AT ARMC
Chlamydia: NEGATIVE
Neisseria Gonorrhea: NEGATIVE

## 2015-03-25 ENCOUNTER — Ambulatory Visit (HOSPITAL_COMMUNITY)
Admission: RE | Admit: 2015-03-25 | Discharge: 2015-03-25 | Disposition: A | Discharge status: Home / Self Care | Payer: Medicaid Other | Source: Ambulatory Visit | Attending: Obstetrics and Gynecology | Admitting: Obstetrics and Gynecology

## 2015-03-25 ENCOUNTER — Inpatient Hospital Stay (HOSPITAL_COMMUNITY)
Admission: AD | Admit: 2015-03-25 | Discharge: 2015-03-25 | Disposition: A | Discharge status: Home / Self Care | Payer: Medicaid Other | Source: Ambulatory Visit | Attending: Obstetrics and Gynecology | Admitting: Obstetrics and Gynecology

## 2015-03-25 ENCOUNTER — Ambulatory Visit (HOSPITAL_COMMUNITY): Admission: RE | Admit: 2015-03-25 | Payer: Self-pay | Source: Ambulatory Visit

## 2015-03-25 DIAGNOSIS — Z3A01 Less than 8 weeks gestation of pregnancy: Secondary | ICD-10-CM | POA: Insufficient documentation

## 2015-03-25 DIAGNOSIS — R109 Unspecified abdominal pain: Secondary | ICD-10-CM | POA: Insufficient documentation

## 2015-03-25 DIAGNOSIS — O9989 Other specified diseases and conditions complicating pregnancy, childbirth and the puerperium: Secondary | ICD-10-CM | POA: Insufficient documentation

## 2015-03-25 NOTE — MAU Note (Signed)
[place] APP

## 2015-03-25 NOTE — MAU Provider Note (Signed)
Patient is 28 y.o. [id number] [redacted]w[redacted]d here for follow up of results of ultrasound.  Was last seen at [redacted]w[redacted]d by LMP, HCG quant 2,270 with u/s with IUGS, no yolk sac or fetal pole.  U/S today: Intrauterine gestational sac: Visualized/normal in shape.  Yolk sac: Visualized  Embryo: Visualized  Cardiac Activity: Visualized  Heart Rate: 126 bpm  CRL: 3 mm 5 w 6 d [place] EDC: 11/19/2015  Maternal uterus/adnexae: Probable small right ovarian corpus luteum noted. Normal appearance of left ovary. No adnexal mass identified. Tiny amount of simple free fluid noted.  IMPRESSION: Single living IUP measuring 5 weeks 6 days with [place] EDC of 11/19/2015.  No significant maternal uterine or adnexal abnormality identified.   Patient is 29 y.o. [id number] [redacted]w[redacted]d here for follow up of ultrasound - sono consistent with LMP, normal IUP - advised to establish prenatal care, reports she will likely terminate.  Encouraged to take PNV should she decide to keep pregnancy.  Perry Mount, MD 4:49 PM

## 2015-12-07 ENCOUNTER — Encounter (HOSPITAL_COMMUNITY): Payer: Self-pay

## 2015-12-07 ENCOUNTER — Inpatient Hospital Stay (HOSPITAL_COMMUNITY)
Admission: AD | Admit: 2015-12-07 | Discharge: 2015-12-07 | Disposition: A | Discharge status: Home / Self Care | Payer: Medicaid Other | Source: Ambulatory Visit | Attending: Family Medicine | Admitting: Family Medicine

## 2015-12-07 DIAGNOSIS — O480 Post-term pregnancy: Secondary | ICD-10-CM | POA: Insufficient documentation

## 2015-12-07 DIAGNOSIS — O2343 Unspecified infection of urinary tract in pregnancy, third trimester: Secondary | ICD-10-CM | POA: Insufficient documentation

## 2015-12-07 DIAGNOSIS — O99513 Diseases of the respiratory system complicating pregnancy, third trimester: Secondary | ICD-10-CM | POA: Insufficient documentation

## 2015-12-07 DIAGNOSIS — O163 Unspecified maternal hypertension, third trimester: Secondary | ICD-10-CM | POA: Insufficient documentation

## 2015-12-07 DIAGNOSIS — Z3A42 42 weeks gestation of pregnancy: Secondary | ICD-10-CM | POA: Insufficient documentation

## 2015-12-07 DIAGNOSIS — N3000 Acute cystitis without hematuria: Secondary | ICD-10-CM

## 2015-12-07 DIAGNOSIS — J45909 Unspecified asthma, uncomplicated: Secondary | ICD-10-CM | POA: Insufficient documentation

## 2015-12-07 LAB — URINALYSIS, ROUTINE W REFLEX MICROSCOPIC
Bilirubin Urine: NEGATIVE
Glucose, UA: NEGATIVE mg/dL
Ketones, ur: 15 mg/dL — AB
Nitrite: POSITIVE — AB
PH: 5.5 (ref 5.0–8.0)
Protein, ur: >300 mg/dL — AB

## 2015-12-07 LAB — URINE MICROSCOPIC-ADD ON

## 2015-12-07 LAB — POCT PREGNANCY, URINE: Preg Test, Ur: NEGATIVE

## 2015-12-07 MED ORDER — SULFAMETHOXAZOLE-TRIMETHOPRIM 800-160 MG PO TABS: 1.0000 | ORAL_TABLET | Freq: Two times a day (BID) | ORAL | Status: DC

## 2015-12-07 NOTE — MAU Note (Signed)
Dysuria and only voiding small amounts since yesterday.  Unsure if pregnant. LMP 11/30/15

## 2015-12-07 NOTE — Progress Notes (Signed)
Written and verbal d/c instructions given and understanding voiced. 

## 2015-12-07 NOTE — MAU Provider Note (Signed)
MAU HISTORY AND PHYSICAL  Chief Complaint:  Urinary Frequency and Dysuria   Emma Page Hedberg is a 30 y.o.  [id number] with IUP at 5915w6d presenting for Urinary Frequency and Dysuria   Began two days ago. Frquency, hesitancy, and dysuria. No hematuria. No fever or chills, no back pain, no nausea/vomiting. One previous uti.     Past Medical History  Diagnosis Date  . Hypertension   . Asthma     Past Surgical History  Procedure Laterality Date  . Dilation and curettage of uterus    . Abortion    . Eye surgery Right     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    Family History  Problem Relation Age of Onset  . Anesthesia problems Neg Hx   . Cancer Maternal Grandmother   . Heart disease Maternal Grandmother   . Cancer Paternal Grandmother   . Diabetes Paternal Grandfather   . Hypertension Father     Social History  Substance Use Topics  . Smoking status: Never Smoker   . Smokeless tobacco: Not on file  . Alcohol Use: Yes     Comment: occasional-socially    No Known Allergies  Prescriptions prior to admission  Medication Sig Dispense Refill Last Dose  . albuterol (PROVENTIL HFA;VENTOLIN HFA) 108 (90 BASE) MCG/ACT inhaler Inhale 2 puffs into the lungs every 6 (six) hours as needed (for emergency asthma attacks). For shortness of breath.   More than a month at Unknown time  . metroNIDAZOLE (FLAGYL) 500 MG tablet Take 1 tablet (500 mg total) by mouth 2 (two) times daily. 14 tablet 0     Review of Systems - Negative except for what is mentioned in HPI.  Physical Exam  Blood pressure 136/93, pulse 88, temperature 98.3 F (36.8 C), temperature source Oral, resp. rate 20, height 5\' 4"  (1.626 m), weight 179 lb 12.8 oz (81.557 kg), last menstrual period 02/10/2015, unknown if currently breastfeeding. GENERAL: Well-developed, well-nourished female in no acute distress.  LUNGS: Clear to auscultation bilaterally.  HEART: Regular rate and rhythm. ABDOMEN: Soft, nontender,  nondistended, mild suprapubic ttp. No cva tenderness EXTREMITIES: Nontender, no edema, 2+ distal pulses.    Labs: Results for orders placed or performed during the hospital encounter of 12/07/15 (from the past 24 hour(s))  Urinalysis, Routine w reflex microscopic (not at [facility])   Collection Time: 12/07/15  7:00 PM  Result Value Ref Range   Color, Urine YELLOW YELLOW   APPearance CLEAR CLEAR   Specific Gravity, Urine >1.030 (H) 1.005 - 1.030   pH 5.5 5.0 - 8.0   Glucose, UA NEGATIVE NEGATIVE mg/dL   Hgb urine dipstick LARGE (A) NEGATIVE   Bilirubin Urine NEGATIVE NEGATIVE   Ketones, ur 15 (A) NEGATIVE mg/dL   Protein, ur >811>300 (A) NEGATIVE mg/dL   Nitrite POSITIVE (A) NEGATIVE   Leukocytes, UA TRACE (A) NEGATIVE  Urine microscopic-add on   Collection Time: 12/07/15  7:00 PM  Result Value Ref Range   Squamous Epithelial / LPF 0-5 (A) NONE SEEN   WBC, UA 0-5 0 - 5 WBC/hpf   RBC / HPF 6-30 0 - 5 RBC/hpf   Bacteria, UA MANY (A) NONE SEEN  Pregnancy, urine POC   Collection Time: 12/07/15  7:11 PM  Result Value Ref Range   Preg Test, Ur NEGATIVE NEGATIVE    Imaging Studies:  No results found.  Assessment: Emma Page Runion is  30 y.o. [id number] at 6615w6d presents with acute uncomplicated uti. No signs/symptoms pyelonephritis. Not pregnant.  Plan: - bactrim ds 1 tab po bid for 3 days (desiring something from [employer] 4 dollar list) - f/u culture and gonorrhea/chlamydia - pyelonephritis return precautions  Silvano Bilis 3/4/20177:57 PM

## 2015-12-07 NOTE — Discharge Instructions (Signed)

## 2015-12-10 ENCOUNTER — Telehealth: Payer: Self-pay | Admitting: Obstetrics and Gynecology

## 2015-12-10 LAB — URINE CULTURE: Special Requests: NORMAL

## 2015-12-10 MED ORDER — NITROFURANTOIN MONOHYD MACRO 100 MG PO CAPS: 100.0000 mg | ORAL_CAPSULE | Freq: Two times a day (BID) | ORAL | Status: DC

## 2015-12-10 NOTE — Telephone Encounter (Signed)
E coli uti resistant to bactrim. Called patient to notify. She still has dysuria and frequency but no back pain, fever, or nv. Thus think remains cystitis. S to macrobid, will prescribe for 5 days. Pyelo return precautions discussed.

## 2016-06-10 ENCOUNTER — Encounter (HOSPITAL_COMMUNITY): Payer: Self-pay | Admitting: *Deleted

## 2016-06-10 ENCOUNTER — Inpatient Hospital Stay (HOSPITAL_COMMUNITY)
Admission: AD | Admit: 2016-06-10 | Discharge: 2016-06-11 | Disposition: A | Discharge status: Home / Self Care | Payer: Self-pay | Source: Ambulatory Visit | Attending: Obstetrics & Gynecology | Admitting: Obstetrics & Gynecology

## 2016-06-10 DIAGNOSIS — I10 Essential (primary) hypertension: Secondary | ICD-10-CM | POA: Insufficient documentation

## 2016-06-10 DIAGNOSIS — J45909 Unspecified asthma, uncomplicated: Secondary | ICD-10-CM | POA: Insufficient documentation

## 2016-06-10 DIAGNOSIS — N941 Unspecified dyspareunia: Secondary | ICD-10-CM | POA: Insufficient documentation

## 2016-06-10 DIAGNOSIS — N898 Other specified noninflammatory disorders of vagina: Secondary | ICD-10-CM | POA: Insufficient documentation

## 2016-06-10 LAB — URINALYSIS, ROUTINE W REFLEX MICROSCOPIC
Bilirubin Urine: NEGATIVE
Glucose, UA: NEGATIVE mg/dL
Ketones, ur: NEGATIVE mg/dL
Leukocytes, UA: NEGATIVE
NITRITE: NEGATIVE
Protein, ur: NEGATIVE mg/dL
SPECIFIC GRAVITY, URINE: 1.015 (ref 1.005–1.030)
pH: 6 (ref 5.0–8.0)

## 2016-06-10 LAB — CBC
HCT: 33.8 % — ABNORMAL LOW (ref 36.0–46.0)
HEMOGLOBIN: 11.9 g/dL — AB (ref 12.0–15.0)
MCH: 28.9 pg (ref 26.0–34.0)
MCHC: 35.2 g/dL (ref 30.0–36.0)
MCV: 82 fL (ref 78.0–100.0)
Platelets: 325 10*3/uL (ref 150–400)
RBC: 4.12 MIL/uL (ref 3.87–5.11)
RDW: 13.6 % (ref 11.5–15.5)
WBC: 8.5 10*3/uL (ref 4.0–10.5)

## 2016-06-10 LAB — POCT PREGNANCY, URINE: PREG TEST UR: NEGATIVE

## 2016-06-10 LAB — WET PREP, GENITAL
CLUE CELLS WET PREP: NONE SEEN
Sperm: NONE SEEN
Trich, Wet Prep: NONE SEEN
YEAST WET PREP: NONE SEEN

## 2016-06-10 LAB — URINE MICROSCOPIC-ADD ON: WBC, UA: NONE SEEN WBC/hpf (ref 0–5)

## 2016-06-10 NOTE — MAU Provider Note (Signed)
History     CSN: [id number]  Arrival date and time: 06/10/16 2205   First Provider Initiated Contact with Patient 06/10/16 2313      Chief Complaint  Patient presents with  . Dyspareunia  . Vaginal Discharge   Vaginal Discharge  The patient's primary symptoms include vaginal discharge. This is a new problem. The current episode started in the past 7 days. The problem occurs constantly. The problem has been unchanged. Pain severity now: 10/10 during intercourse only.  The problem affects both sides. She is not pregnant. Associated symptoms include nausea. Pertinent negatives include no abdominal pain, chills, constipation, diarrhea, dysuria, fever, frequency, urgency or vomiting. The vaginal discharge was thick, white and malodorous. There has been no bleeding. The symptoms are aggravated by intercourse. She has tried nothing for the symptoms. She is sexually active. She uses nothing for contraception. Her menstrual history has been regular (LMP 05/22/16 ).    Past Medical History:  Diagnosis Date  . Asthma   . Hypertension     Past Surgical History:  Procedure Laterality Date  . abortion    . DILATION AND CURETTAGE OF UTERUS    . 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 Hypertension Father   . Cancer Maternal Grandmother   . Heart disease Maternal Grandmother   . Cancer Paternal Grandmother   . Diabetes Paternal Grandfather   . Anesthesia problems Neg Hx     Social History  Substance Use Topics  . Smoking status: Never Smoker  . Smokeless tobacco: Never Used  . Alcohol use Yes     Comment: occasional-socially, 3-4 week    Allergies: No Known Allergies  Prescriptions Prior to Admission  Medication Sig Dispense Refill Last Dose  . albuterol (PROVENTIL HFA;VENTOLIN HFA) 108 (90 BASE) MCG/ACT inhaler Inhale 2 puffs into the lungs every 6 (six) hours as needed (for emergency asthma attacks). For shortness  of breath.   More than a month at Unknown time  . nitrofurantoin, macrocrystal-monohydrate, (MACROBID) 100 MG capsule Take 1 capsule (100 mg total) by mouth 2 (two) times daily. 10 capsule 0     Review of Systems  Constitutional: Negative for chills and fever.  Gastrointestinal: Positive for nausea. Negative for abdominal pain, constipation, diarrhea and vomiting.  Genitourinary: Positive for vaginal discharge. Negative for dysuria, frequency and urgency.   Physical Exam   Blood pressure 145/96, pulse 86, temperature 98 F (36.7 C), temperature source Oral, resp. rate 18, last menstrual period 05/22/2016, SpO2 100 %, unknown if currently breastfeeding.  Physical Exam  Nursing note and vitals reviewed. Constitutional: She is oriented to person, place, and time. She appears well-developed and well-nourished. No distress.  HENT:  Head: Normocephalic.  Cardiovascular: Normal rate.   Respiratory: Effort normal.  GI: Soft. There is no tenderness. There is no rebound.  Genitourinary:  Genitourinary Comments:  External: no lesion Vagina: small amount of white discharge Cervix: pink, smooth, no CMT Uterus: NSSC Adnexa: NT   Neurological: She is alert and oriented to person, place, and time.  Skin: Skin is warm and dry.  Psychiatric: She has a normal mood and affect.     Results for orders placed or performed during the hospital encounter of 06/10/16 (from the past 24 hour(s))  Urinalysis, Routine w reflex microscopic (not at [facility])     Status: Abnormal   Collection Time: 06/10/16 10:33 PM  Result Value Ref Range  Color, Urine YELLOW YELLOW   APPearance CLEAR CLEAR   Specific Gravity, Urine 1.015 1.005 - 1.030   pH 6.0 5.0 - 8.0   Glucose, UA NEGATIVE NEGATIVE mg/dL   Hgb urine dipstick TRACE (A) NEGATIVE   Bilirubin Urine NEGATIVE NEGATIVE   Ketones, ur NEGATIVE NEGATIVE mg/dL   Protein, ur NEGATIVE NEGATIVE mg/dL   Nitrite NEGATIVE NEGATIVE   Leukocytes, UA NEGATIVE NEGATIVE   Urine microscopic-add on     Status: Abnormal   Collection Time: 06/10/16 10:33 PM  Result Value Ref Range   Squamous Epithelial / LPF 0-5 (A) NONE SEEN   WBC, UA NONE SEEN 0 - 5 WBC/hpf   RBC / HPF 0-5 0 - 5 RBC/hpf   Bacteria, UA RARE (A) NONE SEEN   Urine-Other MUCOUS PRESENT   Pregnancy, urine POC     Status: None   Collection Time: 06/10/16 11:07 PM  Result Value Ref Range   Preg Test, Ur NEGATIVE NEGATIVE  Wet prep, genital     Status: Abnormal   Collection Time: 06/10/16 11:20 PM  Result Value Ref Range   Yeast Wet Prep HPF POC NONE SEEN NONE SEEN   Trich, Wet Prep NONE SEEN NONE SEEN   Clue Cells Wet Prep HPF POC NONE SEEN NONE SEEN   WBC, Wet Prep HPF POC MODERATE (A) NONE SEEN   Sperm NONE SEEN   CBC     Status: Abnormal   Collection Time: 06/10/16 11:21 PM  Result Value Ref Range   WBC 8.5 4.0 - 10.5 K/uL   RBC 4.12 3.87 - 5.11 MIL/uL   Hemoglobin 11.9 (L) 12.0 - 15.0 g/dL   HCT 19.1 (L) 47.8 - 29.5 %   MCV 82.0 78.0 - 100.0 fL   MCH 28.9 26.0 - 34.0 pg   MCHC 35.2 30.0 - 36.0 g/dL   RDW 62.1 30.8 - 65.7 %   Platelets 325 150 - 400 K/uL  ;  MAU Course  Procedures  MDM   Assessment and Plan  No diagnosis found. DC home Comfort measures reviewed  RX: none  Return to MAU as needed FU with OB as planned    Tawnya Crook 06/10/2016, 11:14 PM

## 2016-06-10 NOTE — MAU Note (Signed)
Pt states she has some vaginal white discharge with odor.  Also states she has pain during intercourse. Pt very short with answers due to messing with her cell phone.

## 2016-06-10 NOTE — Discharge Instructions (Signed)
Dyspareunia Dyspareunia is pain during sexual intercourse. It is most common in women, but it also happens in men.  CAUSES  Female The pain from this condition is usually felt when anything is put into the vagina, but any part of the genitals may cause pain during sex. Even sitting or wearing pants can cause pain. Sometimes, a cause cannot be found. Some causes of pain during intercourse are:  Infections of the skin around the vagina.  Vaginal infections, such as a yeast, bacterial, or viral infection.  Vaginismus. This is the inability to have anything put in the vagina even when the woman wants it to happen. There is an automatic muscle contraction and pain. The pain of the muscle contraction can be so severe that intercourse is impossible.  Allergic reaction from spermicides, semen, condoms, scented tampons, soaps, douches, and vaginal sprays.  A fluid-filled sac (cyst) on the Bartholin or Skene glands, located at the opening of the vagina.  Scar tissue in the vagina from a surgically enlarged opening (episiotomy) or tearing after delivering a baby.  Vaginal dryness. This is more common in menopause. The normal secretions of the vagina are decreased. Changes in estrogen levels and increased difficulty becoming aroused can cause painful sex. Vaginal dryness can also happen when taking birth control pills.  Thinning of the tissue (atrophy) of the vulva and vagina. This makes the area thinner, smaller, unable to stretch to accommodate a penis, and prone to infection and tearing.  Vulvar vestibulitis or vestibulodynia.This is a condition that causes pain involving the area around the entrance to the vagina.The most common cause in young women is birth control pills.Women with low estrogen levels (postmenopausal women) may also experience this.Other causes include allergic reactions, too many nerve endings, skin conditions, and pelvic muscles that cannot relax.  Vulvar dermatoses. This  includes skin conditions such as lichen sclerosus and lichen planus.  Lack of foreplay to lubricate the vagina. This can cause vaginal dryness.  Noncancerous tumors (fibroids) in the uterus.  Uterus lining tissue growing outside the uterus (endometriosis).  Pregnancy that starts in the fallopian tube (tubal pregnancy).  Pregnancy or breastfeeding your baby. This can cause vaginal dryness.  A tilting or prolapse of the uterus. Prolapse is when weak and stretched muscles around the uterus allow it to fall into the vagina.  Problems with the ovaries, cysts, or scar tissue. This may be worse with certain sexual positions.  Previous surgeries causing adhesions or scar tissue in the vagina or pelvis.  Bladder and intestinal problems.  Psychological problems (such as depression or anxiety). This may make pain worse.  Negative attitudes about sex, experiencing rape, sexual assault, and misinformation about sex. These issues are often related to some types of pain.  Previous pelvic infection, causing scar tissue in the pelvis and on the female organs.  Cyst or tumor on the ovary.  Cancer of the female organs.  Certain medicines.  Medical problems such as diabetes, arthritis, or thyroid disease. Female In men, there are many physical causes of sexual discomfort. Some causes of pain during intercourse are:  Infections of the prostate, bladder, or seminal vesicles. This can cause pain after ejaculation.  An inflamed bladder (interstitial cystitis). This may cause pain from ejaculation.  Gonorrheal infections. This may cause pain during ejaculation.  An inflamed urethra (urethritis) or inflamed prostate (prostatitis). This can make genital stimulation painful or uncomfortable.  Deformities of the penis, such as Peyronie's disease.  A tight foreskin.  Cancer of the female organs.    Psychological problems. This may make pain worse. DIAGNOSIS   Your caregiver will take a history and  have you describe where the pain is located (outside the vagina, in the vagina, in the pelvis). You may be asked when you experience pain, such as with penetration or with thrusting.  Following this, your caregiver will do a physical exam. Let your caregiver know if the exam is too painful.  During the final part of the female exam, your caregiver will feel your uterus and ovaries with one hand on the abdomen and one finger in your vagina. This is a pelvic exam.  Blood tests, a Pap test, cultures for infection, an ultrasound test, and X-rays may be done. You may need to see a specialist for female problems (gynecologist).  Your caregiver may do a CT scan, MRI, or laparoscopy. Laparoscopy is a procedure to look into the pelvis with a lighted tube, through a cut (incision) in the abdomen. TREATMENT  Your caregiver can help you determine the best course of treatment. Sometimes, more testing is done. Continue with the suggested testing until your caregiver feels sure about your diagnosis and how to treat it. Sometimes, it is difficult to find the reason for the pain. The search for the cause and treatment can be frustrating. Treatment often takes several weeks to a few months before you notice any improvement. You may also need to avoid sexual activity until symptoms improve.Continuing to have sex when it hurts can delay healing and actually make the problem worse. The treatment depends on the cause of the pain. Treatment may include:  Medicines such as antibiotics, vaginal or skin creams, hormones, or antidepressants.  Minor or major surgery.  Psychological counseling or group therapy.  Kegel exercises and vaginal dilators to help certain cases of vaginismus (spasms). Do this only if recommended by your caregiver.Kegel exercises can make some problems worse.  Applying lubrication as recommended by your caregiver if you have dryness.  Sex therapy for you and your sex partner. It is common for  the pain to continue after the reason for the pain has been treated. Some reasons for this include a conditioned response. This means the person having the pain becomes so familiar with the pain that the pain continues as a response, even though the cause is removed. Sex therapy can help with this problem. HOME CARE INSTRUCTIONS   Follow your caregiver's instructions about taking medicines, tests, counseling, and follow-up treatment.  Do not use scented tampons, douches, vaginal sprays, or soaps.  Use water-based lubricants for dryness. Oil lubricants can cause irritation.  Do not use spermicides or condoms that irritate you.  Openly discuss with your partner your sexual experience, your desires, foreplay, and different sexual positions for a more comfortable and enjoyable sexual relationship.  Join group sessions for therapy, if needed.  Practice safe sex at all times.  Empty your bladder before having intercourse.  Try different positions during sexual intercourse.  Take over-the-counter pain medicine recommended by your caregiver before having sexual intercourse.  Do not wear pantyhose. Knee-high and thigh-high hose are okay.  Avoid scrubbing your vulva with a washcloth. Wash the area gently and pat dry with a towel. SEEK MEDICAL CARE IF:   You develop vaginal bleeding after sexual intercourse.  You develop a lump at the opening of your vagina, even if it is not painful.  You have abnormal vaginal discharge.  You have vaginal dryness.  You have itching or irritation of the vulva or vagina.  You   develop a rash or reaction to your medicine. SEEK IMMEDIATE MEDICAL CARE IF:   You develop severe abdominal pain during or shortly after sexual intercourse. You could have a ruptured ovarian cyst or ruptured tubal pregnancy.  You have a fever.  You have painful or bloody urination.  You have painful sexual intercourse, and you never had it before.  You pass out after having  sexual intercourse.   This information is not intended to replace advice given to you by your health care provider. Make sure you discuss any questions you have with your health care provider.   Document Released: 10/11/2007 Document Revised: 12/14/2011 Document Reviewed: 04/23/2015 [employer] Interactive Patient Education 2016 [employer]  

## 2016-06-11 LAB — RPR: RPR Ser Ql: NONREACTIVE

## 2016-06-11 LAB — GC/CHLAMYDIA PROBE AMP (~~LOC~~) NOT AT ARMC
Chlamydia: NEGATIVE
Neisseria Gonorrhea: NEGATIVE

## 2016-06-11 LAB — HIV ANTIBODY (ROUTINE TESTING W REFLEX): HIV SCREEN 4TH GENERATION: NONREACTIVE

## 2016-06-26 ENCOUNTER — Other Ambulatory Visit: Payer: Self-pay | Admitting: Obstetrics and Gynecology

## 2016-07-27 ENCOUNTER — Other Ambulatory Visit: Payer: Self-pay | Admitting: Obstetrics and Gynecology

## 2016-08-10 ENCOUNTER — Inpatient Hospital Stay (HOSPITAL_COMMUNITY)
Admission: AD | Admit: 2016-08-10 | Discharge: 2016-08-10 | Disposition: A | Discharge status: Home / Self Care | Payer: Self-pay | Source: Ambulatory Visit | Attending: Family Medicine | Admitting: Family Medicine

## 2016-08-10 ENCOUNTER — Encounter (HOSPITAL_COMMUNITY): Payer: Self-pay

## 2016-08-10 ENCOUNTER — Inpatient Hospital Stay (HOSPITAL_COMMUNITY): Payer: Self-pay

## 2016-08-10 DIAGNOSIS — O3680X0 Pregnancy with inconclusive fetal viability, not applicable or unspecified: Secondary | ICD-10-CM

## 2016-08-10 DIAGNOSIS — O161 Unspecified maternal hypertension, first trimester: Secondary | ICD-10-CM | POA: Insufficient documentation

## 2016-08-10 DIAGNOSIS — J45909 Unspecified asthma, uncomplicated: Secondary | ICD-10-CM | POA: Insufficient documentation

## 2016-08-10 DIAGNOSIS — Z3A01 Less than 8 weeks gestation of pregnancy: Secondary | ICD-10-CM | POA: Insufficient documentation

## 2016-08-10 DIAGNOSIS — O209 Hemorrhage in early pregnancy, unspecified: Secondary | ICD-10-CM | POA: Insufficient documentation

## 2016-08-10 DIAGNOSIS — O99511 Diseases of the respiratory system complicating pregnancy, first trimester: Secondary | ICD-10-CM | POA: Insufficient documentation

## 2016-08-10 LAB — URINALYSIS, ROUTINE W REFLEX MICROSCOPIC
Bilirubin Urine: NEGATIVE
GLUCOSE, UA: NEGATIVE mg/dL
Ketones, ur: NEGATIVE mg/dL
LEUKOCYTES UA: NEGATIVE
Nitrite: NEGATIVE
PH: 5.5 (ref 5.0–8.0)
PROTEIN: NEGATIVE mg/dL

## 2016-08-10 LAB — CBC
HEMATOCRIT: 32.2 % — AB (ref 36.0–46.0)
HEMOGLOBIN: 10.9 g/dL — AB (ref 12.0–15.0)
MCH: 28.9 pg (ref 26.0–34.0)
MCHC: 33.9 g/dL (ref 30.0–36.0)
MCV: 85.4 fL (ref 78.0–100.0)
Platelets: 330 10*3/uL (ref 150–400)
RBC: 3.77 MIL/uL — AB (ref 3.87–5.11)
RDW: 14 % (ref 11.5–15.5)
WBC: 7.5 10*3/uL (ref 4.0–10.5)

## 2016-08-10 LAB — URINE MICROSCOPIC-ADD ON

## 2016-08-10 LAB — ABO/RH: ABO/RH(D): A NEG

## 2016-08-10 LAB — HCG, QUANTITATIVE, PREGNANCY: hCG, Beta Chain, Quant, S: 7206 m[IU]/mL — ABNORMAL HIGH (ref <5)

## 2016-08-10 LAB — WET PREP, GENITAL
SPERM: NONE SEEN
TRICH WET PREP: NONE SEEN
Yeast Wet Prep HPF POC: NONE SEEN

## 2016-08-10 LAB — POCT PREGNANCY, URINE: Preg Test, Ur: POSITIVE — AB

## 2016-08-10 MED ORDER — METRONIDAZOLE 500 MG PO TABS: 500.0000 mg | ORAL_TABLET | Freq: Two times a day (BID) | ORAL | 0 refills | Status: DC

## 2016-08-10 NOTE — MAU Provider Note (Signed)
History     CSN: [id number]  Arrival date and time: 08/10/16 1122   First Provider Initiated Contact with Patient 08/10/16 1207      No chief complaint on file.  HPI   [name] is 30 y.o. female [phone] here in MAU with vaginal bleeding. The bleeding started Thursday, it started out as pink and spotting. On Sunday morning the bleeding increased like a menstrual cycle.   She is having lower abdominal cramping that she rates a 6/10; she has not taken anything for the pain.   LMP is unknown.   OB History    Gravida Para Term Preterm AB Living   14 3 3  0 10 3   SAB TAB Ectopic Multiple Live Births   5 5     3       Past Medical History:  Diagnosis Date  . Asthma   . Hypertension     Past Surgical History:  Procedure Laterality Date  . abortion    . DILATION AND CURETTAGE OF UTERUS    . 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 Hypertension Father   . Cancer Maternal Grandmother   . Heart disease Maternal Grandmother   . Cancer Paternal Grandmother   . Diabetes Paternal Grandfather   . Anesthesia problems Neg Hx     Social History  Substance Use Topics  . Smoking status: Never Smoker  . Smokeless tobacco: Never Used  . Alcohol use No     Comment: occasional-socially, 3-4 week; not while pregnant    Allergies: No Known Allergies  Prescriptions Prior to Admission  Medication Sig Dispense Refill Last Dose  . albuterol (PROVENTIL HFA;VENTOLIN HFA) 108 (90 BASE) MCG/ACT inhaler Inhale 2 puffs into the lungs every 6 (six) hours as needed (for emergency asthma attacks). For shortness of breath.   More than a month at Unknown time   Results for orders placed or performed during the hospital encounter of 08/10/16 (from the past 48 hour(s))  Urinalysis, Routine w reflex microscopic (not at [facility])     Status: Abnormal   Collection Time: 08/10/16 11:36 AM  Result Value Ref Range    Color, Urine YELLOW YELLOW   APPearance CLEAR CLEAR   Specific Gravity, Urine >1.030 (H) 1.005 - 1.030   pH 5.5 5.0 - 8.0   Glucose, UA NEGATIVE NEGATIVE mg/dL   Hgb urine dipstick MODERATE (A) NEGATIVE   Bilirubin Urine NEGATIVE NEGATIVE   Ketones, ur NEGATIVE NEGATIVE mg/dL   Protein, ur NEGATIVE NEGATIVE mg/dL   Nitrite NEGATIVE NEGATIVE   Leukocytes, UA NEGATIVE NEGATIVE  Urine microscopic-add on     Status: Abnormal   Collection Time: 08/10/16 11:36 AM  Result Value Ref Range   Squamous Epithelial / LPF 0-5 (A) NONE SEEN   WBC, UA 0-5 0 - 5 WBC/hpf   RBC / HPF 0-5 0 - 5 RBC/hpf   Bacteria, UA FEW (A) NONE SEEN   Urine-Other MUCOUS PRESENT   Pregnancy, urine POC     Status: Abnormal   Collection Time: 08/10/16 12:03 PM  Result Value Ref Range   Preg Test, Ur POSITIVE (A) NEGATIVE    Comment:        THE SENSITIVITY OF THIS METHODOLOGY IS >24 mIU/mL   Wet prep, genital     Status: Abnormal   Collection Time: 08/10/16 12:17 PM  Result Value Ref Range   Yeast Wet Prep  HPF POC NONE SEEN NONE SEEN   Trich, Wet Prep NONE SEEN NONE SEEN   Clue Cells Wet Prep HPF POC PRESENT (A) NONE SEEN   WBC, Wet Prep HPF POC MODERATE (A) NONE SEEN    Comment: MANY BACTERIA SEEN   Sperm NONE SEEN   CBC     Status: Abnormal   Collection Time: 08/10/16 12:26 PM  Result Value Ref Range   WBC 7.5 4.0 - 10.5 K/uL   RBC 3.77 (L) 3.87 - 5.11 MIL/uL   Hemoglobin 10.9 (L) 12.0 - 15.0 g/dL   HCT 95.232.2 (L) 84.136.0 - 32.446.0 %   MCV 85.4 78.0 - 100.0 fL   MCH 28.9 26.0 - 34.0 pg   MCHC 33.9 30.0 - 36.0 g/dL   RDW 40.114.0 02.711.5 - 25.315.5 %   Platelets 330 150 - 400 K/uL  ABO/Rh     Status: None   Collection Time: 08/10/16 12:26 PM  Result Value Ref Range   ABO/RH(D) A NEG   hCG, quantitative, pregnancy     Status: Abnormal   Collection Time: 08/10/16 12:26 PM  Result Value Ref Range   hCG, Beta Chain, Quant, S 7,206 (H) <5 mIU/mL    Comment:          GEST. AGE      CONC.  (mIU/mL)   <=1 WEEK        5 -  50     2 WEEKS       50 - 500     3 WEEKS       100 - 10,000     4 WEEKS     1,000 - 30,000     5 WEEKS     3,500 - 115,000   6-8 WEEKS     12,000 - 270,000    12 WEEKS     15,000 - 220,000        FEMALE AND NON-PREGNANT FEMALE:     LESS THAN 5 mIU/mL     [place] Ob Comp Less 14 Wks  Result Date: 08/10/2016 CLINICAL DATA:  Vaginal bleeding EXAM: OBSTETRIC <14 WK US AND TRANSVAGINAL OB US TECHNIQUE: Both transabdominal and transvaginal ultrasound examinations were performed for complete evaluation of the gestation as well as the maternal uterus, adnexal regions, and pelvic cul-de-sac. Transvaginal technique was performed to assess early pregnancy. COMPARISON:  None. FINDINGS: Intrauterine gestational sac: Single irregular shape intrauterine gestational sac is visualized. Yolk sac:  Not identified Embryo:  Not identified Cardiac Activity: Not identified MSD: 10.2  mm   5 w   5  d Subchorionic hemorrhage:  None visualized. Maternal uterus/adnexae: No myometrial masses. The ovaries are grossly unremarkable. Right ovary measures 2.9 x 1.6 x 2.2 cm. The left ovary measures 2.8 x 1.4 x 2.1 cm. Ring of fire appearance within both ovaries suggests possible bilateral corpus luteal cysts. No free fluid. IMPRESSION: Slightly irregular shape of presumed intrauterine gestational sac, however no fetal pole or yolk sac is visualized. Possible early intrauterine gestational sac, but no yolk sac, fetal pole, or cardiac activity yet visualized. Recommend follow-up quantitative B-HCG levels and follow-up US in 14 days to confirm and assess viability. This recommendation follows SRU consensus guidelines: Diagnostic Criteria for Nonviable Pregnancy Early in the First Trimester. Malva Limes Engl J Med 2013; 664:4034-74; 369:1443-51. Suspected ovarian corpus luteal cysts may also be re-evaluated at that time. Electronically Signed   By: Jasmine PangKim  Fujinaga M.D.   On: 08/10/2016 14:18   [place] Ob Transvaginal  Result Date:  08/10/2016 CLINICAL DATA:  Vaginal  bleeding EXAM: OBSTETRIC <14 WK US AND TRANSVAGINAL OB US TECHNIQUE: Both transabdominal and transvaginal ultrasound examinations were performed for complete evaluation of the gestation as well as the maternal uterus, adnexal regions, and pelvic cul-de-sac. Transvaginal technique was performed to assess early pregnancy. COMPARISON:  None. FINDINGS: Intrauterine gestational sac: Single irregular shape intrauterine gestational sac is visualized. Yolk sac:  Not identified Embryo:  Not identified Cardiac Activity: Not identified MSD: 10.2  mm   5 w   5  d Subchorionic hemorrhage:  None visualized. Maternal uterus/adnexae: No myometrial masses. The ovaries are grossly unremarkable. Right ovary measures 2.9 x 1.6 x 2.2 cm. The left ovary measures 2.8 x 1.4 x 2.1 cm. Ring of fire appearance within both ovaries suggests possible bilateral corpus luteal cysts. No free fluid. IMPRESSION: Slightly irregular shape of presumed intrauterine gestational sac, however no fetal pole or yolk sac is visualized. Possible early intrauterine gestational sac, but no yolk sac, fetal pole, or cardiac activity yet visualized. Recommend follow-up quantitative B-HCG levels and follow-up US in 14 days to confirm and assess viability. This recommendation follows SRU consensus guidelines: Diagnostic Criteria for Nonviable Pregnancy Early in the First Trimester. Malva Limes Engl J Med 2013; 161:0960-45; 369:1443-51. Suspected ovarian corpus luteal cysts may also be re-evaluated at that time. Electronically Signed   By: Jasmine PangKim  Fujinaga M.D.   On: 08/10/2016 14:18   Review of Systems  Constitutional: Negative for chills and fever.  Gastrointestinal: Negative for abdominal pain.  Genitourinary: Negative for dysuria and urgency.   Physical Exam   Blood pressure 150/90, pulse 71, temperature 99.6 F (37.6 C), resp. rate 18, height 5\' 6"  (1.676 m), weight 169 lb (76.7 kg), last menstrual period 06/28/2016, unknown if currently breastfeeding.  Physical Exam   Constitutional: She is oriented to person, place, and time. She appears well-developed and well-nourished. No distress.  HENT:  Head: Normocephalic.  Eyes: Pupils are equal, round, and reactive to light.  GI: Soft. She exhibits no distension. There is no tenderness. There is no rebound and no guarding.  Genitourinary:  Genitourinary Comments: Vagina - Small amount of  Dark red blood noted,  mild odor  Cervix - small active bleeding  Bimanual exam: Cervix closed Uterus non tender, slightly enlarged  Adnexa non tender, no masses bilaterally GC/Chlam, wet prep done Chaperone present for exam.   Musculoskeletal: Normal range of motion.  Neurological: She is alert and oriented to person, place, and time.  Skin: Skin is warm. She is not diaphoretic.  Psychiatric: Her behavior is normal.    MAU Course  Procedures  None   MDM ABO CBC  Hcg  Patient less than [redacted] weeks gestation, rhogam no longer indicated in this population.   Assessment and Plan    A:  1. Pregnancy of unknown anatomic location   2. Vaginal bleeding in pregnancy, first trimester     P:  Discharge home in stable condition Follow up in the [facility] on Wednesday for 48 hour follow up beta hcg level Ectopic precautions  Patient needs BP check on Wednesday- discharge BP elevated. Return to MAU if symptoms worsen  Bleeding precautions    Duane LopeJennifer I Suzanne Kho, NP 08/10/2016 7:07 PM

## 2016-08-10 NOTE — Discharge Instructions (Signed)

## 2016-08-10 NOTE — MAU Note (Signed)
Pt presents to MAU with vaginal bleeding and cramping. Began spotting Thursday, and turned into a period on Sunday. Had a positive home pregnancy test a few weeks ago. Reports not taking BP meds since finding out she was pregnant.

## 2016-08-11 LAB — GC/CHLAMYDIA PROBE AMP (~~LOC~~) NOT AT ARMC
Chlamydia: NEGATIVE
Neisseria Gonorrhea: NEGATIVE

## 2016-08-11 LAB — HIV ANTIBODY (ROUTINE TESTING W REFLEX): HIV SCREEN 4TH GENERATION: NONREACTIVE

## 2016-08-12 ENCOUNTER — Inpatient Hospital Stay (HOSPITAL_COMMUNITY)
Admission: AD | Admit: 2016-08-12 | Discharge: 2016-08-12 | Disposition: A | Discharge status: Home / Self Care | Payer: Self-pay | Source: Ambulatory Visit | Attending: Family Medicine | Admitting: Family Medicine

## 2016-08-12 ENCOUNTER — Ambulatory Visit: Payer: Self-pay | Admitting: General Practice

## 2016-08-12 ENCOUNTER — Inpatient Hospital Stay (HOSPITAL_COMMUNITY): Payer: Self-pay

## 2016-08-12 ENCOUNTER — Telehealth: Payer: Self-pay | Admitting: General Practice

## 2016-08-12 ENCOUNTER — Encounter (HOSPITAL_COMMUNITY): Payer: Self-pay | Admitting: *Deleted

## 2016-08-12 DIAGNOSIS — O200 Threatened abortion: Secondary | ICD-10-CM

## 2016-08-12 DIAGNOSIS — O26891 Other specified pregnancy related conditions, first trimester: Secondary | ICD-10-CM | POA: Insufficient documentation

## 2016-08-12 DIAGNOSIS — O3680X0 Pregnancy with inconclusive fetal viability, not applicable or unspecified: Secondary | ICD-10-CM

## 2016-08-12 DIAGNOSIS — R109 Unspecified abdominal pain: Secondary | ICD-10-CM | POA: Insufficient documentation

## 2016-08-12 DIAGNOSIS — O99511 Diseases of the respiratory system complicating pregnancy, first trimester: Secondary | ICD-10-CM | POA: Insufficient documentation

## 2016-08-12 DIAGNOSIS — Z3A01 Less than 8 weeks gestation of pregnancy: Secondary | ICD-10-CM | POA: Insufficient documentation

## 2016-08-12 DIAGNOSIS — J45909 Unspecified asthma, uncomplicated: Secondary | ICD-10-CM | POA: Insufficient documentation

## 2016-08-12 DIAGNOSIS — O209 Hemorrhage in early pregnancy, unspecified: Secondary | ICD-10-CM

## 2016-08-12 HISTORY — DX: Unspecified infectious disease: B99.9

## 2016-08-12 HISTORY — DX: Unspecified abnormal cytological findings in specimens from vagina: R87.629

## 2016-08-12 HISTORY — DX: Trichomoniasis, unspecified: A59.9

## 2016-08-12 HISTORY — DX: Gestational (pregnancy-induced) hypertension without significant proteinuria, unspecified trimester: O13.9

## 2016-08-12 HISTORY — DX: Chlamydial infection, unspecified: A74.9

## 2016-08-12 HISTORY — DX: Gonococcal infection, unspecified: A54.9

## 2016-08-12 LAB — HCG, QUANTITATIVE, PREGNANCY: hCG, Beta Chain, Quant, S: 7997 m[IU]/mL — ABNORMAL HIGH (ref <5)

## 2016-08-12 MED ORDER — IBUPROFEN 800 MG PO TABS
800.0000 mg | ORAL_TABLET | Freq: Three times a day (TID) | ORAL | 0 refills | Status: DC
Start: 2016-08-12 — End: 2017-01-14

## 2016-08-12 NOTE — Telephone Encounter (Signed)
Called patient and informed her of bhcg results. Advised her to go to MAU for evaluation for possible ectopic. Patient verbalized understanding to all & had no questions

## 2016-08-12 NOTE — MAU Provider Note (Signed)
History    Chief Complaint  Patient presents with  . Abdominal Pain  . Vaginal Bleeding   [name] is a 30 y.o. [id number] female at 4866w3d who presents with vaginal bleeding and lower abdominal cramping.  Her symptoms began 6 days ago with vaginal spotting.  Three days later her bleeding became "period" like and associated with lower abdominal cramping.  She was seen in the MAU 2 days ago and had an US which showed a possible gestational sac without yolk sac, fetal heartbeat, or fetal pole.  In addition, her B-hcg was 7,206 and she was told to follow up in 48 hours for repeat B-hcg.  In [facility] clinic today her repeat B-hcg was [id number] and was sent back to the MAU for further workup.  Patient reports changing pads every 3-4 hours due to bleeding.  She denies dizziness and new onset fatigue.  No vaginal discharge or vomiting.  No other complaints.      OB History    Gravida Para Term Preterm AB Living   15 3 3  0 11 3   SAB TAB Ectopic Multiple Live Births   6 5     3       Past Medical History:  Diagnosis Date  . Asthma   . Chlamydia   . Gonorrhea   . Hypertension   . Infection    UTI  . Pregnancy induced hypertension   . Trichomonas infection   . Vaginal Pap smear, abnormal    bx x3    Past Surgical History:  Procedure Laterality Date  . abortion    . DILATION AND CURETTAGE OF UTERUS    . 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 Hypertension Father   . Cancer Maternal Grandmother   . Heart disease Maternal Grandmother   . Cancer Paternal Grandmother   . Diabetes Paternal Grandfather   . Anesthesia problems Neg Hx     Social History  Substance Use Topics  . Smoking status: Never Smoker  . Smokeless tobacco: Never Used  . Alcohol use No     Comment: occasional-socially, 3-4 week; not while pregnant    Allergies: No Known Allergies  Prescriptions Prior to Admission  Medication Sig  Dispense Refill Last Dose  . metroNIDAZOLE (FLAGYL) 500 MG tablet Take 1 tablet (500 mg total) by mouth 2 (two) times daily. 14 tablet 0 08/12/2016 at Unknown time  . albuterol (PROVENTIL HFA;VENTOLIN HFA) 108 (90 BASE) MCG/ACT inhaler Inhale 2 puffs into the lungs every 6 (six) hours as needed (for emergency asthma attacks). For shortness of breath.   rescue    Review of Systems  Constitutional: Negative for fever.  HENT: Negative.   Respiratory: Negative for shortness of breath.   Cardiovascular: Negative.   Gastrointestinal: Positive for abdominal pain (lower abdominal cramping) and nausea. Negative for diarrhea and vomiting.  Genitourinary: Negative for dysuria.  Musculoskeletal: Negative.   Skin: Negative.   Neurological: Negative for dizziness.  Psychiatric/Behavioral: Negative.    Physical Exam Blood pressure 131/85, pulse 94, temperature 98.2 F (36.8 C), temperature source Oral, resp. rate 16, height 5\' 5"  (1.651 m), weight 80.5 kg (177 lb 6.4 oz), last menstrual period 06/28/2016, unknown if currently breastfeeding. Physical Exam  Constitutional: She is oriented to person, place, and time. She appears well-developed and well-nourished. No distress.  HENT:  Head: Normocephalic and atraumatic.  Neck: Normal range of motion. Neck  supple.  Cardiovascular: Normal rate, regular rhythm and normal heart sounds.   Respiratory: Effort normal. No respiratory distress.  GI: Soft. Bowel sounds are normal. She exhibits no distension. There is no tenderness. There is no rebound and no guarding.  Musculoskeletal: Normal range of motion.  Neurological: She is alert and oriented to person, place, and time.  Skin: Skin is warm and dry.  Psychiatric: She has a normal mood and affect. Her behavior is normal.    Results for orders placed or performed in visit on 08/12/16 (from the past 24 hour(s))  hCG, quantitative, pregnancy     Status: Abnormal   Collection Time: 08/12/16 11:40 AM  Result  Value Ref Range   hCG, Beta Chain, Quant, S 7,997 (H) <5 mIU/mL     MAU Course Procedures  MDM Transvaginal US: irregular gestational sac without fetal pole, yolk sac, or fetal heart beat.    Her US findings combined with a b-hcg that failed to increase by 50% over 48 hours likely represents a failed pregnancy.  We can't officially rule in intrauterine pregnancy at this time due to lack of fetal pole, yolk sac, and heartbeat.     Assessment [name] is a 30 y.o. [id number] female at 5643w3d who presents with vaginal bleeding and lower abdominal cramping.  Her symptoms combined with laboratory and US workup is consistent with a failing pregnancy.  SAB is most likely at this time.  However, we are unable to rule out ectopic without evidence of IUP on US.  Plan Stable for discharge Ectopic pre-cautions given Follow up in [facility] for 48 hr repeat b-hcg Follow up in 1 week for repeat [place]

## 2016-08-12 NOTE — MAU Provider Note (Signed)
History     CSN: [id number]  Arrival date and time: 08/12/16 1416   First Provider Initiated Contact with Patient 08/12/16 1529      Chief Complaint  Patient presents with  . Abdominal Pain  . Vaginal Bleeding   HPI   [name] is a 30 y.o. female [id number] here In MAU with vaginal bleeding. She was seen 2 days ago with vaginal bleeding and was found to have a positive pregnancy test. [place] showed a ? Irregular gestational sac, no yolk sac. She was brought back to the clinic 48 hours later for repeat quant. She was sent up to MAU today per the clinic staff for a repeat US. Her quant did not rise approprietly.   OB History    Gravida Para Term Preterm AB Living   15 3 3  0 11 3   SAB TAB Ectopic Multiple Live Births   6 5     3       Past Medical History:  Diagnosis Date  . Asthma   . Chlamydia   . Gonorrhea   . Hypertension   . Infection    UTI  . Pregnancy induced hypertension   . Trichomonas infection   . Vaginal Pap smear, abnormal    bx x3    Past Surgical History:  Procedure Laterality Date  . abortion    . DILATION AND CURETTAGE OF UTERUS    . 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 Hypertension Father   . Cancer Maternal Grandmother   . Heart disease Maternal Grandmother   . Cancer Paternal Grandmother   . Diabetes Paternal Grandfather   . Anesthesia problems Neg Hx     Social History  Substance Use Topics  . Smoking status: Never Smoker  . Smokeless tobacco: Never Used  . Alcohol use No     Comment: occasional-socially, 3-4 week; not while pregnant    Allergies: No Known Allergies  Prescriptions Prior to Admission  Medication Sig Dispense Refill Last Dose  . metroNIDAZOLE (FLAGYL) 500 MG tablet Take 1 tablet (500 mg total) by mouth 2 (two) times daily. 14 tablet 0 08/12/2016 at Unknown time  . albuterol (PROVENTIL HFA;VENTOLIN HFA) 108 (90 BASE) MCG/ACT inhaler  Inhale 2 puffs into the lungs every 6 (six) hours as needed (for emergency asthma attacks). For shortness of breath.   rescue   Results for orders placed or performed in visit on 08/12/16 (from the past 48 hour(s))  hCG, quantitative, pregnancy     Status: Abnormal   Collection Time: 08/12/16 11:40 AM  Result Value Ref Range   hCG, Beta Chain, Quant, S 7,997 (H) <5 mIU/mL    Comment:          GEST. AGE      CONC.  (mIU/mL)   <=1 WEEK        5 - 50     2 WEEKS       50 - 500     3 WEEKS       100 - 10,000     4 WEEKS     1,000 - 30,000     5 WEEKS     3,500 - 115,000   6-8 WEEKS     12,000 - 270,000    12 WEEKS     15,000 - 220,000        FEMALE AND NON-PREGNANT FEMALE:  LESS THAN 5 mIU/mL    [place] Ob Transvaginal  Result Date: 08/12/2016 CLINICAL DATA:  Vaginal bleeding, cramping, pregnant with beta HCG 7997 EXAM: TRANSVAGINAL OB ULTRASOUND TECHNIQUE: Transvaginal ultrasound was performed for complete evaluation of the gestation as well as the maternal uterus, adnexal regions, and pelvic cul-de-sac. COMPARISON:  08/10/2016 FINDINGS: Intrauterine gestational sac: Single, irregular in shape Yolk sac:  Not Visualized. Embryo:  Not Visualized. MSD: 8.3  mm   5 w   4 d, previously 10.2 mm (5 weeks 5 days) Subchorionic hemorrhage:  None visualized. Maternal uterus/adnexae: Bilateral ovaries are within normal limits, noting a right corpus luteal cyst. No free fluid. IMPRESSION: Irregular gestational sac without yolk sac or fetal pole, measuring 5 weeks 4 days by mean sac diameter. Lack of appropriate interval growth is noted. Findings are suspicious but not yet definitive for failed pregnancy. Recommend follow-up US in 10-14 days for definitive diagnosis. This recommendation follows SRU consensus guidelines: Diagnostic Criteria for Nonviable Pregnancy Early in the First Trimester. Malva Limes Engl J Med 2013; 027:2536-64; 369:1443-51. Electronically Signed   By: Charline BillsSriyesh  Krishnan M.D.   On: 08/12/2016 16:46    Review of  Systems  Constitutional: Positive for chills. Negative for fever.  Gastrointestinal: Positive for abdominal pain (Middle- lower abdominal cramping). Negative for nausea and vomiting.   Physical Exam   Blood pressure 127/83, pulse 87, temperature 98.2 F (36.8 C), temperature source Oral, resp. rate 16, height 5\' 5"  (1.651 m), weight 177 lb 6.4 oz (80.5 kg), last menstrual period 06/28/2016, unknown if currently breastfeeding.  Physical Exam  Constitutional: She is oriented to person, place, and time. She appears well-developed and well-nourished. No distress.  HENT:  Head: Normocephalic.  Respiratory: Effort normal.  GI: Soft. She exhibits no distension. There is no tenderness. There is no rebound.  Genitourinary:  Genitourinary Comments: Bimanual exam: cervix closed, anterior. Small amount of dark red blood noted   Musculoskeletal: Normal range of motion.  Neurological: She is alert and oriented to person, place, and time.  Skin: Skin is warm. She is not diaphoretic.  Psychiatric: Her behavior is normal.   MAU Course  Procedures  None   MDM  Beta hcg level 11/6: 7206 Beta hcg level 11/8: 7997 Discussed patient with Dr. Alysia PennaErvin; plan to bring patient back for an additional quant. Discussed possible cytotec use on Friday if quant remains unchanged.   Assessment and Plan   A:  1. Pregnancy of unknown anatomic location   2. Vaginal bleeding in pregnancy, first trimester   3. Threatened miscarriage     P:  Discharge home in stable condition Return Friday to MAU for repeat beta hcg; Clinic had multiple hcg levels scheduled on Friday @ 11:00. Patient instructed to come here for that reason.  Bleeding precautions.  Return to MAU if symptoms worsen.    Duane LopeJennifer I Ermine Stebbins, NP 08/12/2016 7:26 PM

## 2016-08-12 NOTE — MAU Note (Signed)
Sent up from clinic. Bleeding started on Thursday,  Has increased.  Cramping in lower abd, sometimes to right side

## 2016-08-12 NOTE — Discharge Instructions (Signed)

## 2016-08-12 NOTE — Progress Notes (Signed)
Patient here for stat hcg today. Patient reports menstrual like cramps & bleeding like a period. Discussed with Dr Macon LargeAnyanwu who states patient does not need to be seen by a provider at this time, can call patient with results this afternoon. Informed patient and she provided number of [phone]. Will call with results. Spoke with Dr Jolayne Pantheronstant regarding results, who advised patient go to MAU for further evaluation for possible ectopic and repeat u/s. Will call patient

## 2016-08-14 ENCOUNTER — Telehealth: Payer: Self-pay | Admitting: Advanced Practice Midwife

## 2016-08-14 ENCOUNTER — Inpatient Hospital Stay (HOSPITAL_COMMUNITY)
Admission: AD | Admit: 2016-08-14 | Discharge: 2016-08-14 | Disposition: A | Discharge status: Home / Self Care | Payer: Self-pay | Source: Ambulatory Visit | Attending: Obstetrics and Gynecology | Admitting: Obstetrics and Gynecology

## 2016-08-14 DIAGNOSIS — O0281 Inappropriate change in quantitative human chorionic gonadotropin (hCG) in early pregnancy: Secondary | ICD-10-CM

## 2016-08-14 DIAGNOSIS — Z6791 Unspecified blood type, Rh negative: Secondary | ICD-10-CM

## 2016-08-14 DIAGNOSIS — O4690 Antepartum hemorrhage, unspecified, unspecified trimester: Secondary | ICD-10-CM

## 2016-08-14 DIAGNOSIS — O034 Incomplete spontaneous abortion without complication: Secondary | ICD-10-CM

## 2016-08-14 DIAGNOSIS — O26899 Other specified pregnancy related conditions, unspecified trimester: Secondary | ICD-10-CM

## 2016-08-14 LAB — CBC
HCT: 31.2 % — ABNORMAL LOW (ref 36.0–46.0)
HEMOGLOBIN: 10.4 g/dL — AB (ref 12.0–15.0)
MCH: 28.5 pg (ref 26.0–34.0)
MCHC: 33.3 g/dL (ref 30.0–36.0)
MCV: 85.5 fL (ref 78.0–100.0)
Platelets: 336 10*3/uL (ref 150–400)
RBC: 3.65 MIL/uL — ABNORMAL LOW (ref 3.87–5.11)
RDW: 14.1 % (ref 11.5–15.5)
WBC: 8.2 10*3/uL (ref 4.0–10.5)

## 2016-08-14 LAB — HCG, QUANTITATIVE, PREGNANCY: HCG, BETA CHAIN, QUANT, S: 4895 m[IU]/mL — AB (ref <5)

## 2016-08-14 MED ORDER — RHO D IMMUNE GLOBULIN 1500 UNIT/2ML IJ SOSY
300.0000 ug | PREFILLED_SYRINGE | Freq: Once | INTRAMUSCULAR | Status: AC
  Administered 2016-08-14: 300 ug via INTRAMUSCULAR
  Filled 2016-08-14: qty 2

## 2016-08-14 MED ORDER — PROMETHAZINE HCL 25 MG PO TABS: 25.0000 mg | ORAL_TABLET | Freq: Four times a day (QID) | ORAL | 2 refills | Status: DC | PRN

## 2016-08-14 MED ORDER — MISOPROSTOL 200 MCG PO TABS: ORAL_TABLET | ORAL | 1 refills | Status: DC

## 2016-08-14 NOTE — Discharge Instructions (Signed)
Incomplete Miscarriage °A miscarriage is the sudden loss of an unborn baby (fetus) before the 20th week of pregnancy. In an incomplete miscarriage, parts of the fetus or placenta (afterbirth) remain in the body.  °Having a miscarriage can be an emotional experience. Talk with your health care provider about any questions you may have about miscarrying, the grieving process, and your future pregnancy plans. °CAUSES  °· Problems with the fetal chromosomes that make it impossible for the baby to develop normally. Problems with the baby's genes or chromosomes are most often the result of errors that occur by chance as the embryo divides and grows. The problems are not inherited from the parents. °· Infection of the cervix or uterus. °· Hormone problems. °· Problems with the cervix, such as having an incompetent cervix. This is when the tissue in the cervix is not strong enough to hold the pregnancy. °· Problems with the uterus, such as an abnormally shaped uterus, uterine fibroids, or congenital abnormalities. °· Certain medical conditions. °· Smoking, drinking alcohol, or taking illegal drugs. °· Trauma. °SYMPTOMS  °· Vaginal bleeding or spotting, with or without cramps or pain. °· Pain or cramping in the abdomen or lower back. °· Passing fluid, tissue, or blood clots from the vagina. °DIAGNOSIS  °Your health care provider will perform a physical exam. You may also have an ultrasound to confirm the miscarriage. Blood or urine tests may also be ordered. °TREATMENT  °· Usually, a dilation and curettage (D&C) procedure is performed. During a D&C procedure, the cervix is widened (dilated) and any remaining fetal or placental tissue is gently removed from the uterus. °· Antibiotic medicines are prescribed if there is an infection. Other medicines may be given to reduce the size of the uterus (contract) if there is a lot of bleeding. °· If you have Rh negative blood and your baby was Rh positive, you will need a Rho (D)  immune globulin shot. This shot will protect any future baby from having Rh blood problems in future pregnancies. °· You may be confined to bed rest. This means you should stay in bed and only get up to use the bathroom. °HOME CARE INSTRUCTIONS  °· Rest as directed by your health care provider. °· Restrict activity as directed by your health care provider. You may be allowed to continue light activity if curettage was not done but you require further treatment. °· Keep track of the number of pads you use each day. Keep track of how soaked (saturated) they are. Record this information. °· Do not  use tampons. °· Do not douche or have sexual intercourse until approved by your health care provider. °· Keep all follow-up appointments for reevaluation and continuing management. °· Only take over-the-counter or prescription medicines for pain, fever, or discomfort as directed by your health care provider. °· Take antibiotic medicine as directed by your health care provider. Make sure you finish it even if you start to feel better. °SEEK IMMEDIATE MEDICAL CARE IF:  °· You experience severe cramps in your stomach, back, or abdomen. °· You have an unexplained temperature (make sure to record these temperatures). °· You pass large clots or tissue (save these for your health care provider to inspect). °· Your bleeding increases. °· You become light-headed, weak, or have fainting episodes. °MAKE SURE YOU:  °· Understand these instructions. °· Will watch your condition. °· Will get help right away if you are not doing well or get worse. °  °This information is not intended to   replace advice given to you by your health care provider. Make sure you discuss any questions you have with your health care provider.   Document Released: 09/21/2005 Document Revised: 10/12/2014 Document Reviewed: 04/20/2013 Elsevier Interactive Patient Education 2016 [employer]  .hnc

## 2016-08-14 NOTE — MAU Note (Signed)
Patient presents with vaginal bleeding changing a pad every couple of hours, having lower abdominal pain.

## 2016-08-14 NOTE — MAU Provider Note (Signed)
History     First Provider Initiated Solicitor with Patient 08/14/2016 at 1439.    Chief Complaint:  Follow-up   Emma Page is  30 y.o. [id number] Patient's last menstrual period was 06/28/2016.Marland Kitchen Patient is here for follow up of quantitative HCG and ongoing surveillance of pregnancy status.   She is [redacted]w[redacted]d weeks gestation  by LMP.    Since her last visit, the patient is without new complaint.     ROS Abdomin Pain: None Vaginal bleeding: Small-moderate   Passage of clots or tissue: None Dizziness: None  Her previous Quantitative HCG values are:  Results for Emma, Page (MRN [id number]) as of 08/14/2016 16:20  Ref. Range 08/10/2016 12:26 08/12/2016 11:40 08/14/2016 13:09  HCG, Beta Chain, Quant, S Latest Ref Range: <5 mIU/mL 7,206 (H) 7,997 (H) 4,895 (H)     Physical Exam   BP 132/93 (BP Location: Right Arm)   Pulse 72   Temp 98.7 F (37.1 C) (Oral)   Resp 16   Ht 5\' 5"  (1.651 m)   Wt 177 lb 12.8 oz (80.6 kg)   LMP 06/28/2016   BMI 29.59 kg/m  Constitutional: Well-nourished female in no apparent distress. No pallor Neuro: Alert and oriented 4 Cardiovascular: Normal rate Respiratory: Normal effort and rate Abdomen: Soft, nontender Gynecological Exam: examination not indicated  Labs: Results for orders placed or performed during the hospital encounter of 08/14/16 (from the past 24 hour(s))  hCG, quantitative, pregnancy   Collection Time: 08/14/16  1:09 PM  Result Value Ref Range   hCG, Beta Chain, Quant, S 4,895 (H) <5 mIU/mL  CBC   Collection Time: 08/14/16  1:09 PM  Result Value Ref Range   WBC 8.2 4.0 - 10.5 K/uL   RBC 3.65 (L) 3.87 - 5.11 MIL/uL   Hemoglobin 10.4 (L) 12.0 - 15.0 g/dL   HCT 96.2 (L) 95.2 - 84.1 %   MCV 85.5 78.0 - 100.0 fL   MCH 28.5 26.0 - 34.0 pg   MCHC 33.3 30.0 - 36.0 g/dL   RDW 32.4 40.1 - 02.7 %   Platelets 336 150 - 400 K/uL    Ultrasound Studies:   US Ob Comp Less 14 Wks  Result Date: 08/10/2016 CLINICAL  DATA:  Vaginal bleeding EXAM: OBSTETRIC <14 WK [place] AND TRANSVAGINAL OB US TECHNIQUE: Both transabdominal and transvaginal ultrasound examinations were performed for complete evaluation of the gestation as well as the maternal uterus, adnexal regions, and pelvic cul-de-sac. Transvaginal technique was performed to assess early pregnancy. COMPARISON:  None. FINDINGS: Intrauterine gestational sac: Single irregular shape intrauterine gestational sac is visualized. Yolk sac:  Not identified Embryo:  Not identified Cardiac Activity: Not identified MSD: 10.2  mm   5 w   5  d Subchorionic hemorrhage:  None visualized. Maternal uterus/adnexae: No myometrial masses. The ovaries are grossly unremarkable. Right ovary measures 2.9 x 1.6 x 2.2 cm. The left ovary measures 2.8 x 1.4 x 2.1 cm. Ring of fire appearance within both ovaries suggests possible bilateral corpus luteal cysts. No free fluid. IMPRESSION: Slightly irregular shape of presumed intrauterine gestational sac, however no fetal pole or yolk sac is visualized. Possible early intrauterine gestational sac, but no yolk sac, fetal pole, or cardiac activity yet visualized. Recommend follow-up quantitative B-HCG levels and follow-up US in 14 days to confirm and assess viability. This recommendation follows SRU consensus guidelines: Diagnostic Criteria for Nonviable Pregnancy Early in the First Trimester. Malva Limes Med 2013; 253:6644-03. Suspected ovarian corpus luteal cysts may also  be re-evaluated at that time. Electronically Signed   By: Jasmine PangKim  Fujinaga M.D.   On: 08/10/2016 14:18   [place] Ob Transvaginal  Result Date: 08/12/2016 CLINICAL DATA:  Vaginal bleeding, cramping, pregnant with beta HCG 7997 EXAM: TRANSVAGINAL OB ULTRASOUND TECHNIQUE: Transvaginal ultrasound was performed for complete evaluation of the gestation as well as the maternal uterus, adnexal regions, and pelvic cul-de-sac. COMPARISON:  08/10/2016 FINDINGS: Intrauterine gestational sac: Single, irregular in  shape Yolk sac:  Not Visualized. Embryo:  Not Visualized. MSD: 8.3  mm   5 w   4 d, previously 10.2 mm (5 weeks 5 days) Subchorionic hemorrhage:  None visualized. Maternal uterus/adnexae: Bilateral ovaries are within normal limits, noting a right corpus luteal cyst. No free fluid. IMPRESSION: Irregular gestational sac without yolk sac or fetal pole, measuring 5 weeks 4 days by mean sac diameter. Lack of appropriate interval growth is noted. Findings are suspicious but not yet definitive for failed pregnancy. Recommend follow-up US in 10-14 days for definitive diagnosis. This recommendation follows SRU consensus guidelines: Diagnostic Criteria for Nonviable Pregnancy Early in the First Trimester. Malva Limes Engl J Med 2013; 161:0960-45; 369:1443-51. Electronically Signed   By: Charline BillsSriyesh  Krishnan M.D.   On: 08/12/2016 16:46   [place] Ob Transvaginal  Result Date: 08/10/2016 CLINICAL DATA:  Vaginal bleeding EXAM: OBSTETRIC <14 WK US AND TRANSVAGINAL OB US TECHNIQUE: Both transabdominal and transvaginal ultrasound examinations were performed for complete evaluation of the gestation as well as the maternal uterus, adnexal regions, and pelvic cul-de-sac. Transvaginal technique was performed to assess early pregnancy. COMPARISON:  None. FINDINGS: Intrauterine gestational sac: Single irregular shape intrauterine gestational sac is visualized. Yolk sac:  Not identified Embryo:  Not identified Cardiac Activity: Not identified MSD: 10.2  mm   5 w   5  d Subchorionic hemorrhage:  None visualized. Maternal uterus/adnexae: No myometrial masses. The ovaries are grossly unremarkable. Right ovary measures 2.9 x 1.6 x 2.2 cm. The left ovary measures 2.8 x 1.4 x 2.1 cm. Ring of fire appearance within both ovaries suggests possible bilateral corpus luteal cysts. No free fluid. IMPRESSION: Slightly irregular shape of presumed intrauterine gestational sac, however no fetal pole or yolk sac is visualized. Possible early intrauterine gestational sac, but no yolk  sac, fetal pole, or cardiac activity yet visualized. Recommend follow-up quantitative B-HCG levels and follow-up US in 14 days to confirm and assess viability. This recommendation follows SRU consensus guidelines: Diagnostic Criteria for Nonviable Pregnancy Early in the First Trimester. Malva Limes Engl J Med 2013; 409:8119-14; 369:1443-51. Suspected ovarian corpus luteal cysts may also be re-evaluated at that time. Electronically Signed   By: Jasmine PangKim  Fujinaga M.D.   On: 08/10/2016 14:18    MAU course/MDM: Quantitative hCG ordered  Pain and bleeding in early pregnancy with drop in Quant, but hemodynamically stable. Offered Expectant management vs Cytotec. Requests Cytotec.       Early Intrauterine Pregnancy Failure  _x__  Documented intrauterine pregnancy failure less than or equal to [redacted] weeks gestation  _x__  No serious current illness  _x__  Baseline Hgb greater than or equal to 10g/dl  _x__  Patient has easily accessible transportation to the hospital  _x__  Clear preference  _x__  Practitioner/physician deems patient reliable  _x__  Counseling by practitioner or physician  _x__  Patient education by RN  _x__  Verbal consent given. Questions addressed.  _*__  Rho-Gam given by RN if indicated--Reviewed chart after pt left. Pt did not receive at previous visit. Will return tonight to get it.  _x__ Medication dispensed   _x__   Cytotec 800 mcg  _x_   Intravaginally by patient at home         __   Intravaginally by RN in MAU        __   Rectally by patient at home        __   Rectally by RN in MAU  Has Rx  Ibuprofen 600 mg 1 tablet by mouth every 6 hours as needed #30  ___  Hydrocodone/acetaminophen 5/325 mg by mouth every 4 to 6 hours as needed  _x__  Phenergan 12.5 mg by mouth every 4 hours as needed for nausea     Assessment: Incomplete AB Rh neg  Plan: Discharge home in stable condition. Bleeding precautions Support given Declines Chaplain Follow-up Information    [facility]).   Specialty:  Obstetrics and Gynecology Why:  For follow-up appointment or sooner as needed Contact information: [address] [place] [id number] [phone]       [facility] Follow up.   Why:  as needed in emergencies Contact information: [address] [id number] [place] [id number] [phone]           Medication List    TAKE these medications   albuterol 108 (90 Base) MCG/ACT inhaler Commonly known as:  PROVENTIL HFA;VENTOLIN HFA Inhale 2 puffs into the lungs every 6 (six) hours as needed (for emergency asthma attacks). For shortness of breath.   ibuprofen 800 MG tablet Commonly known as:  ADVIL,MOTRIN Take 1 tablet (800 mg total) by mouth 3 (three) times daily.   metroNIDAZOLE 500 MG tablet Commonly known as:  FLAGYL Take 1 tablet (500 mg total) by mouth 2 (two) times daily.   misoprostol 200 MCG tablet Commonly known as:  CYTOTEC Insert four tablets vaginally OR place four tablets in between your gums and cheeks (two tablets on each side) as instructed. If you do not pass pregnancy tissue within 24 hours, take second dose. If you do not pass pregnancy tissue within 24 hours after 2nd dose call the office at [phone].   promethazine 25 MG tablet Commonly known as:  PHENERGAN Take 1 tablet (25 mg total) by mouth every 6 (six) hours as needed for nausea or vomiting.       Dorathy KinsmanVirginia Natalie Leclaire, CNM 08/14/2016, 2:46 PM  2/3

## 2016-08-14 NOTE — Telephone Encounter (Signed)
Reviewed pt chart and pt blood is Rh negative with diagnosed SAB today in MAU.  Risks of complications are low in first trimester but increase with SAB or TAB.  Called pt to recommend she return to MAU today for rhophylac injection. Pt stated understanding and will return tonight.

## 2016-08-15 LAB — RH IG WORKUP (INCLUDES ABO/RH)
ABO/RH(D): A NEG
ANTIBODY SCREEN: NEGATIVE
GESTATIONAL AGE(WKS): 6
Unit division: 0

## 2017-01-14 ENCOUNTER — Inpatient Hospital Stay (HOSPITAL_COMMUNITY)
Admission: AD | Admit: 2017-01-14 | Discharge: 2017-01-14 | Disposition: A | Discharge status: Home / Self Care | Payer: Self-pay | Source: Ambulatory Visit | Attending: Obstetrics & Gynecology | Admitting: Obstetrics & Gynecology

## 2017-01-14 ENCOUNTER — Encounter (HOSPITAL_COMMUNITY): Payer: Self-pay

## 2017-01-14 DIAGNOSIS — J45909 Unspecified asthma, uncomplicated: Secondary | ICD-10-CM | POA: Insufficient documentation

## 2017-01-14 DIAGNOSIS — B9689 Other specified bacterial agents as the cause of diseases classified elsewhere: Secondary | ICD-10-CM

## 2017-01-14 DIAGNOSIS — Z114 Encounter for screening for human immunodeficiency virus [HIV]: Secondary | ICD-10-CM | POA: Insufficient documentation

## 2017-01-14 DIAGNOSIS — Z113 Encounter for screening for infections with a predominantly sexual mode of transmission: Secondary | ICD-10-CM

## 2017-01-14 DIAGNOSIS — W448XXA Other foreign body entering into or through a natural orifice, initial encounter: Secondary | ICD-10-CM

## 2017-01-14 DIAGNOSIS — T192XXA Foreign body in vulva and vagina, initial encounter: Secondary | ICD-10-CM | POA: Insufficient documentation

## 2017-01-14 DIAGNOSIS — I10 Essential (primary) hypertension: Secondary | ICD-10-CM | POA: Insufficient documentation

## 2017-01-14 DIAGNOSIS — N760 Acute vaginitis: Secondary | ICD-10-CM | POA: Insufficient documentation

## 2017-01-14 LAB — CBC
HEMATOCRIT: 38 % (ref 36.0–46.0)
HEMOGLOBIN: 12.6 g/dL (ref 12.0–15.0)
MCH: 29 pg (ref 26.0–34.0)
MCHC: 33.2 g/dL (ref 30.0–36.0)
MCV: 87.6 fL (ref 78.0–100.0)
Platelets: 381 10*3/uL (ref 150–400)
RBC: 4.34 MIL/uL (ref 3.87–5.11)
RDW: 14 % (ref 11.5–15.5)
WBC: 10 10*3/uL (ref 4.0–10.5)

## 2017-01-14 LAB — URINALYSIS, ROUTINE W REFLEX MICROSCOPIC
Bilirubin Urine: NEGATIVE
Glucose, UA: NEGATIVE mg/dL
Hgb urine dipstick: NEGATIVE
KETONES UR: NEGATIVE mg/dL
LEUKOCYTES UA: NEGATIVE
NITRITE: NEGATIVE
PROTEIN: NEGATIVE mg/dL
Specific Gravity, Urine: 1.017 (ref 1.005–1.030)
pH: 5 (ref 5.0–8.0)

## 2017-01-14 LAB — POCT PREGNANCY, URINE
PREG TEST UR: NEGATIVE
Preg Test, Ur: NEGATIVE

## 2017-01-14 LAB — WET PREP, GENITAL
SPERM: NONE SEEN
Trich, Wet Prep: NONE SEEN
YEAST WET PREP: NONE SEEN

## 2017-01-14 MED ORDER — METRONIDAZOLE 500 MG PO TABS: 500.0000 mg | ORAL_TABLET | Freq: Two times a day (BID) | ORAL | 0 refills | Status: DC

## 2017-01-14 NOTE — Discharge Instructions (Signed)
Bacterial Vaginosis Bacterial vaginosis is a vaginal infection that occurs when the normal balance of bacteria in the vagina is disrupted. It results from an overgrowth of certain bacteria. This is the most common vaginal infection among women ages 15-44. Because bacterial vaginosis increases your risk for STIs (sexually transmitted infections), getting treated can help reduce your risk for chlamydia, gonorrhea, herpes, and HIV (human immunodeficiency virus). Treatment is also important for preventing complications in pregnant women, because this condition can cause an early (premature) delivery. What are the causes? This condition is caused by an increase in harmful bacteria that are normally present in small amounts in the vagina. However, the reason that the condition develops is not fully understood. What increases the risk? The following factors may make you more likely to develop this condition:  Having a new sexual partner or multiple sexual partners.  Having unprotected sex.  Douching.  Having an intrauterine device (IUD).  Smoking.  Drug and alcohol abuse.  Taking certain antibiotic medicines.  Being pregnant.  You cannot get bacterial vaginosis from toilet seats, bedding, swimming pools, or contact with objects around you. What are the signs or symptoms? Symptoms of this condition include:  Grey or white vaginal discharge. The discharge can also be watery or foamy.  A fish-like odor with discharge, especially after sexual intercourse or during menstruation.  Itching in and around the vagina.  Burning or pain with urination.  Some women with bacterial vaginosis have no signs or symptoms. How is this diagnosed? This condition is diagnosed based on:  Your medical history.  A physical exam of the vagina.  Testing a sample of vaginal fluid under a microscope to look for a large amount of bad bacteria or abnormal cells. Your health care provider may use a cotton swab  or a small wooden spatula to collect the sample.  How is this treated? This condition is treated with antibiotics. These may be given as a pill, a vaginal cream, or a medicine that is put into the vagina (suppository). If the condition comes back after treatment, a second round of antibiotics may be needed. Follow these instructions at home: Medicines  Take over-the-counter and prescription medicines only as told by your health care provider.  Take or use your antibiotic as told by your health care provider. Do not stop taking or using the antibiotic even if you start to feel better. General instructions  If you have a female sexual partner, tell her that you have a vaginal infection. She should see her health care provider and be treated if she has symptoms. If you have a female sexual partner, he does not need treatment.  During treatment: ? Avoid sexual activity until you finish treatment. ? Do not douche. ? Avoid alcohol as directed by your health care provider. ? Avoid breastfeeding as directed by your health care provider.  Drink enough water and fluids to keep your urine clear or pale yellow.  Keep the area around your vagina and rectum clean. ? Wash the area daily with warm water. ? Wipe yourself from front to back after using the toilet.  Keep all follow-up visits as told by your health care provider. This is important. How is this prevented?  Do not douche.  Wash the outside of your vagina with warm water only.  Use protection when having sex. This includes latex condoms and dental dams.  Limit how many sexual partners you have. To help prevent bacterial vaginosis, it is best to have sex with just   one partner (monogamous).  Make sure you and your sexual partner are tested for STIs.  Wear cotton or cotton-lined underwear.  Avoid wearing tight pants and pantyhose, especially during summer.  Limit the amount of alcohol that you drink.  Do not use any products that  contain nicotine or tobacco, such as cigarettes and e-cigarettes. If you need help quitting, ask your health care provider.  Do not use illegal drugs. Where to find more information:  Centers for Disease Control and Prevention: www.cdc.gov/std  American Sexual Health Association (ASHA): www.ashastd.org  U.S. Department of Health and Human Services, Office on Women's Health: www.womenshealth.gov/ or https://www.womenshealth.gov/a-z-topics/bacterial-vaginosis Contact a health care provider if:  Your symptoms do not improve, even after treatment.  You have more discharge or pain when urinating.  You have a fever.  You have pain in your abdomen.  You have pain during sex.  You have vaginal bleeding between periods. Summary  Bacterial vaginosis is a vaginal infection that occurs when the normal balance of bacteria in the vagina is disrupted.  Because bacterial vaginosis increases your risk for STIs (sexually transmitted infections), getting treated can help reduce your risk for chlamydia, gonorrhea, herpes, and HIV (human immunodeficiency virus). Treatment is also important for preventing complications in pregnant women, because the condition can cause an early (premature) delivery.  This condition is treated with antibiotic medicines. These may be given as a pill, a vaginal cream, or a medicine that is put into the vagina (suppository). This information is not intended to replace advice given to you by your health care provider. Make sure you discuss any questions you have with your health care provider. Document Released: 09/21/2005 Document Revised: 06/06/2016 Document Reviewed: 06/06/2016 Elsevier Interactive Patient Education  2017 [employer] Inc.  

## 2017-01-14 NOTE — MAU Note (Signed)
Pt having vaginal discharge, white, and itching, has an odor that started this week.

## 2017-01-14 NOTE — MAU Provider Note (Signed)
History     CSN: [id number]  Arrival date and time: 01/14/17 1743   First Provider Initiated Contact with Patient 01/14/17 1830      Chief Complaint  Patient presents with  . Vaginal Itching   Emma Page is a 31 y.o. [id number] female who presents with vaginal discharge & irritation. Symptoms began a week ago. Patient is sexually active with 1 partner for the last year. Does not use condoms. Denies dyspareunia or postcoital bleeding. Wants STD testing today. LMP 12/24/16.   Vaginal Itching  The patient's primary symptoms include genital itching, a genital odor and vaginal discharge. The patient's pertinent negatives include no genital lesions, missed menses or vaginal bleeding. This is a new problem. The current episode started in the past 7 days. The problem occurs constantly. The problem has been unchanged. The patient is experiencing no pain. She is not pregnant. Pertinent negatives include no abdominal pain, chills, dysuria, fever, nausea, painful intercourse or vomiting. The vaginal discharge was malodorous and white. There has been no bleeding. She has tried nothing for the symptoms. She is sexually active. It is unknown whether or not her partner has an STD. Her menstrual history has been regular. Her past medical history is significant for an STD. There is no history of PID.    Past Medical History:  Diagnosis Date  . Asthma   . Chlamydia   . Gonorrhea   . Hypertension   . Infection    UTI  . Pregnancy induced hypertension   . Trichomonas infection   . Vaginal Pap smear, abnormal    bx x3    Past Surgical History:  Procedure Laterality Date  . abortion    . DILATION AND CURETTAGE OF UTERUS    . 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 Hypertension Father   . Cancer Maternal Grandmother   . Heart disease Maternal Grandmother   . Cancer Paternal Grandmother   . Diabetes Paternal Grandfather    . Anesthesia problems Neg Hx     Social History  Substance Use Topics  . Smoking status: Never Smoker  . Smokeless tobacco: Never Used  . Alcohol use No     Comment: occasional-socially, 3-4 week; not while pregnant    Allergies: No Known Allergies  Prescriptions Prior to Admission  Medication Sig Dispense Refill Last Dose  . albuterol (PROVENTIL HFA;VENTOLIN HFA) 108 (90 BASE) MCG/ACT inhaler Inhale 2 puffs into the lungs every 6 (six) hours as needed (for emergency asthma attacks). For shortness of breath.   rescue  . ibuprofen (ADVIL,MOTRIN) 800 MG tablet Take 1 tablet (800 mg total) by mouth 3 (three) times daily. 21 tablet 0   . metroNIDAZOLE (FLAGYL) 500 MG tablet Take 1 tablet (500 mg total) by mouth 2 (two) times daily. 14 tablet 0 08/12/2016 at Unknown time  . misoprostol (CYTOTEC) 200 MCG tablet Insert four tablets vaginally OR place four tablets in between your gums and cheeks (two tablets on each side) as instructed. If you do not pass pregnancy tissue within 24 hours, take second dose. If you do not pass pregnancy tissue within 24 hours after 2nd dose call the office at [phone]. 4 tablet 1   . promethazine (PHENERGAN) 25 MG tablet Take 1 tablet (25 mg total) by mouth every 6 (six) hours as needed for nausea or vomiting. 30 tablet 2     Review of Systems  Constitutional:  Negative for chills and fever.  Respiratory: Negative for shortness of breath.   Cardiovascular: Negative for chest pain.  Gastrointestinal: Negative.  Negative for abdominal pain, nausea and vomiting.  Genitourinary: Positive for vaginal discharge. Negative for dyspareunia, dysuria, genital sores, missed menses, vaginal bleeding and vaginal pain.   Physical Exam   Blood pressure (!) 148/109, pulse 88, temperature 98 F (36.7 C), temperature source Oral, resp. rate 18, height  (1.727 m), weight 191 lb (86.6 kg), last menstrual period 12/24/2016, SpO2 100 %, unknown if currently  breastfeeding.  Physical Exam  Nursing note and vitals reviewed. Constitutional: She is oriented to person, place, and time. She appears well-developed and well-nourished. No distress.  HENT:  Head: Normocephalic and atraumatic.  Eyes: Conjunctivae are normal. Right eye exhibits no discharge. Left eye exhibits no discharge. No scleral icterus.  Neck: Normal range of motion.  Cardiovascular: Normal rate, regular rhythm and normal heart sounds.   No murmur heard. Respiratory: Effort normal and breath sounds normal. No respiratory distress. She has no wheezes.  GI: Soft. There is no tenderness.  Genitourinary: Uterus normal. Cervix exhibits no motion tenderness and no friability. Right adnexum displays no mass and no tenderness. Left adnexum displays no mass and no tenderness.  Genitourinary Comments: Small amount of thin white discharge. Retained tampon removed during speculum exam.   Neurological: She is alert and oriented to person, place, and time.  Skin: Skin is warm and dry. She is not diaphoretic.  Psychiatric: She has a normal mood and affect. Her behavior is normal. Judgment and thought content normal.    MAU Course  Procedures Results for orders placed or performed during the hospital encounter of 01/14/17 (from the past 24 hour(s))  Urinalysis, Routine w reflex microscopic     Status: None   Collection Time: 01/14/17  5:57 PM  Result Value Ref Range   Color, Urine YELLOW YELLOW   APPearance CLEAR CLEAR   Specific Gravity, Urine 1.017 1.005 - 1.030   pH 5.0 5.0 - 8.0   Glucose, UA NEGATIVE NEGATIVE mg/dL   Hgb urine dipstick NEGATIVE NEGATIVE   Bilirubin Urine NEGATIVE NEGATIVE   Ketones, ur NEGATIVE NEGATIVE mg/dL   Protein, ur NEGATIVE NEGATIVE mg/dL   Nitrite NEGATIVE NEGATIVE   Leukocytes, UA NEGATIVE NEGATIVE  Pregnancy, urine POC     Status: None   Collection Time: 01/14/17  6:28 PM  Result Value Ref Range   Preg Test, Ur NEGATIVE NEGATIVE  Pregnancy, urine POC      Status: None   Collection Time: 01/14/17  6:30 PM  Result Value Ref Range   Preg Test, Ur NEGATIVE NEGATIVE  Wet prep, genital     Status: Abnormal   Collection Time: 01/14/17  6:40 PM  Result Value Ref Range   Yeast Wet Prep HPF POC NONE SEEN NONE SEEN   Trich, Wet Prep NONE SEEN NONE SEEN   Clue Cells Wet Prep HPF POC PRESENT (A) NONE SEEN   WBC, Wet Prep HPF POC FEW (A) NONE SEEN   Sperm NONE SEEN   RPR     Status: None   Collection Time: 01/14/17  6:55 PM  Result Value Ref Range   RPR Ser Ql Non Reactive Non Reactive  HIV antibody     Status: None   Collection Time: 01/14/17  6:55 PM  Result Value Ref Range   HIV Screen 4th Generation wRfx Non Reactive Non Reactive  CBC     Status: None   Collection Time:  01/14/17  6:55 PM  Result Value Ref Range   WBC 10.0 4.0 - 10.5 K/uL   RBC 4.34 3.87 - 5.11 MIL/uL   Hemoglobin 12.6 12.0 - 15.0 g/dL   HCT 16.1 09.6 - 04.5 %   MCV 87.6 78.0 - 100.0 fL   MCH 29.0 26.0 - 34.0 pg   MCHC 33.2 30.0 - 36.0 g/dL   RDW 40.9 81.1 - 91.4 %   Platelets 381 150 - 400 K/uL    MDM UPT negative CBC, HIV, RPR, GC/CT, wet prep Retained tampon found during exam -- per patient she hasn't used tampons since the end of March. Pt afebrile & no leukocytosis noted on CBC.  Elevated BP -- pt has hx of CHTN. Hasn't taken medication yet today. Does have PCP who manages her HTN. Denies chest pain, headache, or SOB.   Assessment and Plan  A: 1. BV (bacterial vaginosis)   2. Screen for STD (sexually transmitted disease)   3. Retained tampon, initial encounter    P: P: Discharge home Rx flagyl Discussed reasons to return to MAU Keep follow up appointment with OB/PCP  GC/CT, HIV, RPR pending    Judeth Horn 01/14/2017, 6:29 PM

## 2017-01-15 LAB — HIV ANTIBODY (ROUTINE TESTING W REFLEX): HIV Screen 4th Generation wRfx: NONREACTIVE

## 2017-01-15 LAB — GC/CHLAMYDIA PROBE AMP (~~LOC~~) NOT AT ARMC
CHLAMYDIA, DNA PROBE: NEGATIVE
NEISSERIA GONORRHEA: NEGATIVE

## 2017-01-15 LAB — RPR: RPR: NONREACTIVE

## 2017-01-28 ENCOUNTER — Inpatient Hospital Stay (HOSPITAL_COMMUNITY)
Admission: AD | Admit: 2017-01-28 | Discharge: 2017-01-28 | Discharge status: Against Medical Advice | Payer: Self-pay | Source: Ambulatory Visit | Attending: Obstetrics & Gynecology | Admitting: Obstetrics & Gynecology

## 2017-01-28 DIAGNOSIS — N898 Other specified noninflammatory disorders of vagina: Secondary | ICD-10-CM | POA: Insufficient documentation

## 2017-01-28 LAB — URINALYSIS, ROUTINE W REFLEX MICROSCOPIC
Bilirubin Urine: NEGATIVE
GLUCOSE, UA: NEGATIVE mg/dL
HGB URINE DIPSTICK: NEGATIVE
Ketones, ur: NEGATIVE mg/dL
Leukocytes, UA: NEGATIVE
Nitrite: NEGATIVE
PH: 7 (ref 5.0–8.0)
Protein, ur: NEGATIVE mg/dL
SPECIFIC GRAVITY, URINE: 1.016 (ref 1.005–1.030)

## 2017-01-28 LAB — POCT PREGNANCY, URINE: PREG TEST UR: NEGATIVE

## 2017-01-28 NOTE — MAU Note (Signed)
Pt was treated for BV last week. Now having some more vaginal irritation and white discharge.Thinks it may be yeast.

## 2017-07-25 ENCOUNTER — Inpatient Hospital Stay (HOSPITAL_COMMUNITY)
Admission: AD | Admit: 2017-07-25 | Discharge: 2017-07-26 | Disposition: A | Discharge status: Home / Self Care | Payer: Self-pay | Source: Ambulatory Visit | Attending: Obstetrics & Gynecology | Admitting: Obstetrics & Gynecology

## 2017-07-25 ENCOUNTER — Encounter (HOSPITAL_COMMUNITY): Payer: Self-pay | Admitting: *Deleted

## 2017-07-25 DIAGNOSIS — N760 Acute vaginitis: Secondary | ICD-10-CM

## 2017-07-25 DIAGNOSIS — O23591 Infection of other part of genital tract in pregnancy, first trimester: Secondary | ICD-10-CM | POA: Insufficient documentation

## 2017-07-25 DIAGNOSIS — O209 Hemorrhage in early pregnancy, unspecified: Secondary | ICD-10-CM | POA: Insufficient documentation

## 2017-07-25 DIAGNOSIS — B9689 Other specified bacterial agents as the cause of diseases classified elsewhere: Secondary | ICD-10-CM | POA: Insufficient documentation

## 2017-07-25 DIAGNOSIS — O3680X0 Pregnancy with inconclusive fetal viability, not applicable or unspecified: Secondary | ICD-10-CM

## 2017-07-25 DIAGNOSIS — Z3A01 Less than 8 weeks gestation of pregnancy: Secondary | ICD-10-CM | POA: Insufficient documentation

## 2017-07-25 LAB — POCT PREGNANCY, URINE: Preg Test, Ur: POSITIVE — AB

## 2017-07-25 NOTE — Progress Notes (Addendum)
2317: Urine for urinalysis and pregnancy test collected and sent to lab.  1200: CBC, HIV, Rhogam and hcG collected  0114: Rhogam IM administered  0120: To Radiology for  OB- Transvaginal US  0220: Patient d/c'd home via ambulatory, discharge instruction given, pt verbalized understanding.

## 2017-07-25 NOTE — MAU Note (Signed)
Pt states she had 2 +upt last week. States she started having a lot of vaginal bleeding yesterday. States the bleeding has lessened and it is brownish color. States she passed some quarter sized clot today. Pt denies pain. Has a white discharge that has an odor but does not itch. LMP: 06/04/2017

## 2017-07-26 ENCOUNTER — Inpatient Hospital Stay (HOSPITAL_COMMUNITY): Payer: Self-pay

## 2017-07-26 DIAGNOSIS — O209 Hemorrhage in early pregnancy, unspecified: Secondary | ICD-10-CM

## 2017-07-26 LAB — URINALYSIS, ROUTINE W REFLEX MICROSCOPIC
BACTERIA UA: NONE SEEN
BILIRUBIN URINE: NEGATIVE
Glucose, UA: NEGATIVE mg/dL
KETONES UR: NEGATIVE mg/dL
NITRITE: NEGATIVE
Protein, ur: NEGATIVE mg/dL
SPECIFIC GRAVITY, URINE: 1.018 (ref 1.005–1.030)
pH: 6 (ref 5.0–8.0)

## 2017-07-26 LAB — CBC
HCT: 32 % — ABNORMAL LOW (ref 36.0–46.0)
Hemoglobin: 10.9 g/dL — ABNORMAL LOW (ref 12.0–15.0)
MCH: 29.7 pg (ref 26.0–34.0)
MCHC: 34.1 g/dL (ref 30.0–36.0)
MCV: 87.2 fL (ref 78.0–100.0)
PLATELETS: 323 10*3/uL (ref 150–400)
RBC: 3.67 MIL/uL — AB (ref 3.87–5.11)
RDW: 13.9 % (ref 11.5–15.5)
WBC: 9.9 10*3/uL (ref 4.0–10.5)

## 2017-07-26 LAB — GC/CHLAMYDIA PROBE AMP (~~LOC~~) NOT AT ARMC
CHLAMYDIA, DNA PROBE: NEGATIVE
NEISSERIA GONORRHEA: NEGATIVE

## 2017-07-26 LAB — HIV ANTIBODY (ROUTINE TESTING W REFLEX): HIV Screen 4th Generation wRfx: NONREACTIVE

## 2017-07-26 LAB — WET PREP, GENITAL
Sperm: NONE SEEN
TRICH WET PREP: NONE SEEN
Yeast Wet Prep HPF POC: NONE SEEN

## 2017-07-26 LAB — HCG, QUANTITATIVE, PREGNANCY: HCG, BETA CHAIN, QUANT, S: 38980 m[IU]/mL — AB (ref <5)

## 2017-07-26 MED ORDER — METRONIDAZOLE 500 MG PO TABS: 500.0000 mg | ORAL_TABLET | Freq: Two times a day (BID) | ORAL | 2 refills | Status: DC

## 2017-07-26 MED ORDER — RHO D IMMUNE GLOBULIN 1500 UNIT/2ML IJ SOSY
300.0000 ug | PREFILLED_SYRINGE | Freq: Once | INTRAMUSCULAR | Status: AC
  Administered 2017-07-26: 300 ug via INTRAMUSCULAR
  Filled 2017-07-26: qty 2

## 2017-07-26 NOTE — MAU Provider Note (Signed)
Chief Complaint: Vaginal Bleeding and Possible Pregnancy   None     SUBJECTIVE HPI: Emma Page is a 31 y.o. [id number] at 4619w3d by LMP who presents to maternity admissions reporting positive pregnancy test x 2 earlier this week, then onset of heavy menstrual bleeding yesterday. The bleeding is lighter and more brown than red today.  Bleeding is associated with abdominal cramping and some vaginal odor. There are no other associated symptoms.  She has not tried any treatments, nothing makes the bleeding or pain better or worse. She denies vaginal itching/burning, urinary symptoms, h/a, dizziness, n/v, or fever/chills.     HPI  Past Medical History:  Diagnosis Date  . Asthma   . Chlamydia   . Gonorrhea   . Hypertension   . Infection    UTI  . Pregnancy induced hypertension   . Trichomonas infection   . Vaginal Pap smear, abnormal    bx x3   Past Surgical History:  Procedure Laterality Date  . abortion    . DILATION AND CURETTAGE OF UTERUS    . EYE SURGERY Right    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   Social History   Social History  . Marital status: Single    Spouse name: N/A  . Number of children: N/A  . Years of education: N/A   Occupational History  . Not on file.   Social History Main Topics  . Smoking status: Never Smoker  . Smokeless tobacco: Never Used  . Alcohol use No     Comment: occasional-socially, 3-4 week; not while pregnant  . Drug use: No  . Sexual activity: Yes    Birth control/ protection: None   Other Topics Concern  . Not on file   Social History Narrative  . No narrative on file   No current facility-administered medications on file prior to encounter.    Current Outpatient Prescriptions on File Prior to Encounter  Medication Sig Dispense Refill  . hydrochlorothiazide (HYDRODIURIL) 25 MG tablet Take 25 mg by mouth daily.    . metroNIDAZOLE (FLAGYL) 500 MG tablet Take 1 tablet (500 mg total) by mouth 2 (two) times daily. 14 tablet  0   No Known Allergies  ROS:  Review of Systems  Constitutional: Negative for chills, fatigue and fever.  Respiratory: Negative for shortness of breath.   Cardiovascular: Negative for chest pain.  Gastrointestinal: Positive for abdominal pain. Negative for nausea and vomiting.  Genitourinary: Positive for pelvic pain and vaginal bleeding. Negative for difficulty urinating, dysuria, flank pain, vaginal discharge and vaginal pain.  Neurological: Negative for dizziness and headaches.  Psychiatric/Behavioral: Negative.      I have reviewed patient's Past Medical Hx, Surgical Hx, Family Hx, Social Hx, medications and allergies.   Physical Exam   Patient Vitals for the past 24 hrs:  BP Temp Temp src Pulse Resp SpO2 Height Weight  07/25/17 2316 131/83 98.3 F (36.8 C) Oral 85 17 100 % - -  07/25/17 2310 132/81 - - 88 - - - -  07/25/17 2309 132/81 98.6 F (37 C) Oral 85 17 100 % - -  07/25/17 2300 - - - - - - 5\' 5"  (1.651 m) 209 lb (94.8 kg)   Constitutional: Well-developed, well-nourished female in no acute distress.  Cardiovascular: normal rate Respiratory: normal effort GI: Abd soft, non-tender. Pos BS x 4 MS: Extremities nontender, no edema, normal ROM Neurologic: Alert and oriented x 4.  GU: Neg CVAT.  PELVIC EXAM: Cervix pink, visually closed, without  lesion, small amount dark red/brown bleeding with small clots, malodorous discharge, vaginal walls and external genitalia normal Bimanual exam: Cervix 0/long/high, firm, anterior, neg CMT, uterus nontender, slightly enlarged, adnexa without tenderness, enlargement, or mass   LAB RESULTS Results for orders placed or performed during the hospital encounter of 07/25/17 (from the past 24 hour(s))  Urinalysis, Routine w reflex microscopic     Status: Abnormal   Collection Time: 07/25/17 10:54 PM  Result Value Ref Range   Color, Urine YELLOW YELLOW   APPearance CLEAR CLEAR   Specific Gravity, Urine 1.018 1.005 - 1.030   pH 6.0  5.0 - 8.0   Glucose, UA NEGATIVE NEGATIVE mg/dL   Hgb urine dipstick LARGE (A) NEGATIVE   Bilirubin Urine NEGATIVE NEGATIVE   Ketones, ur NEGATIVE NEGATIVE mg/dL   Protein, ur NEGATIVE NEGATIVE mg/dL   Nitrite NEGATIVE NEGATIVE   Leukocytes, UA TRACE (A) NEGATIVE   RBC / HPF 0-5 0 - 5 RBC/hpf   WBC, UA 0-5 0 - 5 WBC/hpf   Bacteria, UA NONE SEEN NONE SEEN   Squamous Epithelial / LPF 0-5 (A) NONE SEEN   Mucus PRESENT   Pregnancy, urine POC     Status: Abnormal   Collection Time: 07/25/17 11:41 PM  Result Value Ref Range   Preg Test, Ur POSITIVE (A) NEGATIVE  CBC     Status: Abnormal   Collection Time: 07/25/17 11:53 PM  Result Value Ref Range   WBC 9.9 4.0 - 10.5 K/uL   RBC 3.67 (L) 3.87 - 5.11 MIL/uL   Hemoglobin 10.9 (L) 12.0 - 15.0 g/dL   HCT 16.1 (L) 09.6 - 04.5 %   MCV 87.2 78.0 - 100.0 fL   MCH 29.7 26.0 - 34.0 pg   MCHC 34.1 30.0 - 36.0 g/dL   RDW 40.9 81.1 - 91.4 %   Platelets 323 150 - 400 K/uL  hCG, quantitative, pregnancy     Status: Abnormal   Collection Time: 07/25/17 11:53 PM  Result Value Ref Range   hCG, Beta Chain, Quant, S 38,980 (H) <5 mIU/mL  Rh IG workup (includes ABO/Rh)     Status: None (Preliminary result)   Collection Time: 07/25/17 11:53 PM  Result Value Ref Range   Gestational Age(Wks) 7    ABO/RH(D) A NEG    Antibody Screen NEG    Unit Number [id number]    Blood Component Type RHIG    Unit division 00    Status of Unit ISSUED    Transfusion Status OK TO TRANSFUSE   Wet prep, genital     Status: Abnormal   Collection Time: 07/26/17 12:41 AM  Result Value Ref Range   Yeast Wet Prep HPF POC NONE SEEN NONE SEEN   Trich, Wet Prep NONE SEEN NONE SEEN   Clue Cells Wet Prep HPF POC PRESENT (A) NONE SEEN   WBC, Wet Prep HPF POC FEW (A) NONE SEEN   Sperm NONE SEEN     --/--/A NEG (10/21 2353)  IMAGING US Ob Comp Less 14 Wks  Result Date: 07/26/2017 CLINICAL DATA:  Acute onset of vaginal bleeding.  Initial encounter. EXAM: OBSTETRIC <14 WK  [place] AND TRANSVAGINAL OB US TECHNIQUE: Both transabdominal and transvaginal ultrasound examinations were performed for complete evaluation of the gestation as well as the maternal uterus, adnexal regions, and pelvic cul-de-sac. Transvaginal technique was performed to assess early pregnancy. COMPARISON:  Pelvic ultrasound performed 08/12/2016 FINDINGS: Intrauterine gestational sac: Single; somewhat irregular in shape. Yolk sac:  No Embryo:  No  MSD: 1.26 cm   6 w   0  d Subchorionic hemorrhage:  None visualized. Maternal uterus/adnexae: The uterus is otherwise unremarkable in appearance. The ovaries are within normal limits. The right ovary measures 3.0 x 1.9 x 2.6 cm, while the left ovary measures 2.9 x 0.9 x 1.5 cm. No suspicious adnexal masses are seen; there is no evidence for ovarian torsion. No free fluid is seen within the pelvic cul-de-sac. IMPRESSION: Intrauterine gestational sac is somewhat irregular in shape. No yolk sac or embryo is yet seen. The mean sac diameter of 1.3 cm corresponds to a gestational age of [redacted] weeks 0 days, which does not match the gestational age by LMP, though it remains too early to determine a new estimated date of delivery. If the quantitative beta HCG continues to trend upward, follow-up pelvic ultrasound could be performed in 2 weeks for further evaluation. Electronically Signed   By: Roanna Raider M.D.   On: 07/26/2017 01:57   US Ob Transvaginal  Result Date: 07/26/2017 CLINICAL DATA:  Acute onset of vaginal bleeding.  Initial encounter. EXAM: OBSTETRIC <14 WK [place] AND TRANSVAGINAL OB US TECHNIQUE: Both transabdominal and transvaginal ultrasound examinations were performed for complete evaluation of the gestation as well as the maternal uterus, adnexal regions, and pelvic cul-de-sac. Transvaginal technique was performed to assess early pregnancy. COMPARISON:  Pelvic ultrasound performed 08/12/2016 FINDINGS: Intrauterine gestational sac: Single; somewhat irregular in shape. Yolk  sac:  No Embryo:  No MSD: 1.26 cm   6 w   0  d Subchorionic hemorrhage:  None visualized. Maternal uterus/adnexae: The uterus is otherwise unremarkable in appearance. The ovaries are within normal limits. The right ovary measures 3.0 x 1.9 x 2.6 cm, while the left ovary measures 2.9 x 0.9 x 1.5 cm. No suspicious adnexal masses are seen; there is no evidence for ovarian torsion. No free fluid is seen within the pelvic cul-de-sac. IMPRESSION: Intrauterine gestational sac is somewhat irregular in shape. No yolk sac or embryo is yet seen. The mean sac diameter of 1.3 cm corresponds to a gestational age of [redacted] weeks 0 days, which does not match the gestational age by LMP, though it remains too early to determine a new estimated date of delivery. If the quantitative beta HCG continues to trend upward, follow-up pelvic ultrasound could be performed in 2 weeks for further evaluation. Electronically Signed   By: Roanna Raider M.D.   On: 07/26/2017 01:57    MAU Management/MDM: Ordered labs and [place] and reviewed results.  Findings today could represent a normal early pregnancy, spontaneous abortion or ectopic pregnancy which can be life-threatening.  Ectopic precautions were given to the patient with plan to return in 48 hours for repeat quant hcg to evaluate pregnancy development.  Discussed with pt that high quant hcg and irregular gestational sac most likely SAB but cannot rule out ectopic or early normal IUP so will continue evaluation.  Pt to come to [facility] office at 11:00 am on Wednesday, 07/28/17, for stat hcg.  Return to MAU sooner as needed for worsening pain or bleeding. Bacterial vaginosis treated with Flagyl 500 mg BID x 7 days, discussed prevention of vaginal infections including probiotics, with printed materials given.  Pt discharged with strict ectopic precautions.  ASSESSMENT 1. Pregnancy of unknown anatomic location   2. Vaginal bleeding in pregnancy, first trimester   3. Bacterial vaginosis      PLAN Discharge home Allergies as of 07/26/2017   No Known Allergies  Medication List    TAKE these medications   hydrochlorothiazide 25 MG tablet Commonly known as:  HYDRODIURIL Take 25 mg by mouth daily.   metroNIDAZOLE 500 MG tablet Commonly known as:  FLAGYL Take 1 tablet (500 mg total) by mouth 2 (two) times daily.      Follow-up Information    [facility].   Specialty:  Obstetrics and Gynecology Why:  Come to the [facility] clinic at 11:00 am on Wednesday, 07/28/17 for repeat labwork. You will stay 1-2 hours for results. Return sooner to MAU as needed for heavy bleeding or increased pain. Contact information: [address] [place] [address] [phone]          Sharen Counter Certified Nurse-Midwife 07/26/2017  2:03 AM

## 2017-07-26 NOTE — Discharge Instructions (Signed)
Some natural remedies/prevention to try for bacterial vaginosis: °Take a probiotic tablet/capsule every day for at least 1-2 months.   °Whenever you have symptoms, use boric acid suppositories vaginally every night for a week.   °Do not use scented soaps/perfumes in the vaginal area and wear breathable cotton underwear and do not wear tight restrictive clothing. ° ° °

## 2017-07-27 LAB — RH IG WORKUP (INCLUDES ABO/RH)
ABO/RH(D): A NEG
ANTIBODY SCREEN: NEGATIVE
GESTATIONAL AGE(WKS): 7
Unit division: 0

## 2017-07-28 ENCOUNTER — Ambulatory Visit (HOSPITAL_COMMUNITY)
Admission: RE | Admit: 2017-07-28 | Discharge: 2017-07-28 | Disposition: A | Discharge status: Home / Self Care | Payer: Self-pay | Source: Ambulatory Visit | Attending: Obstetrics and Gynecology | Admitting: Obstetrics and Gynecology

## 2017-07-28 ENCOUNTER — Encounter: Payer: Self-pay | Admitting: General Practice

## 2017-07-28 ENCOUNTER — Ambulatory Visit: Payer: Self-pay | Admitting: Obstetrics and Gynecology

## 2017-07-28 DIAGNOSIS — Z3490 Encounter for supervision of normal pregnancy, unspecified, unspecified trimester: Secondary | ICD-10-CM | POA: Insufficient documentation

## 2017-07-28 DIAGNOSIS — O3680X0 Pregnancy with inconclusive fetal viability, not applicable or unspecified: Secondary | ICD-10-CM

## 2017-07-28 LAB — HCG, QUANTITATIVE, PREGNANCY: HCG, BETA CHAIN, QUANT, S: 55944 m[IU]/mL — AB (ref <5)

## 2017-07-28 MED ORDER — MISOPROSTOL 200 MCG PO TABS: ORAL_TABLET | ORAL | 0 refills | Status: DC

## 2017-07-28 MED ORDER — IBUPROFEN 800 MG PO TABS: 800.0000 mg | ORAL_TABLET | Freq: Three times a day (TID) | ORAL | 1 refills | Status: DC | PRN

## 2017-07-28 MED ORDER — OXYCODONE-ACETAMINOPHEN 5-325 MG PO TABS: 1.0000 | ORAL_TABLET | ORAL | 0 refills | Status: DC | PRN

## 2017-07-28 NOTE — Progress Notes (Signed)
Patient here for stat bhcg today. Patient reports continued bleeding like a period and mild cramping at a pain scale of 4. Patient advised to wait in lobby for results & updated plan of care. Patient verbalized understanding & had no questions at this time. Reviewed results with Dr Earlene Plateravis who recommends ultrasound today due to abnormally rising beta-hcg. Informed patient of results & patient taken upstairs to ultrasound.

## 2017-07-28 NOTE — Patient Instructions (Signed)

## 2017-07-28 NOTE — Progress Notes (Signed)
Patient ID: [name], female   DOB: 1986-01-15, 31 y.o.   MRN: [id number] Ms Arbutus PedLetterlough presents to clinic for follow up BHCG from MAU visit on 10/21 for + pregnancy and vaginal bleeding.  Bhcg at that time was 39 K and U/S revealed a irregular GS.  Bhcg today is 56 K. Still some spotting but no heavy bleeding or pain.  PE AF VSS Lungs clear  Heart RRR Abd soft + BS  A/P Abnormal pregnancy  Concerned about possible Molar pregnancy with high Bhcg and only GS on U/S. D & C recommended to pt. Pt refused surgery. Risks of surgery and not having surgery reviewed with pt.  Case discussed with Dr. Raynelle FanningE. Rossi Gyn Onc who agreed that D & C would be the preferred treatment option but also agreed that Cytotec could be used with close follow up  Discussed with pt. She is agreeable to the home use of Cytotec. She has used in past without problems.  U/r/B and follow up reviewed with pt. She will follow up in 2 weeks. Pt instructed to go to MAU for heavy bleeding, pain or any concerns.

## 2017-08-17 ENCOUNTER — Encounter: Payer: Self-pay | Admitting: Obstetrics & Gynecology

## 2017-08-17 ENCOUNTER — Ambulatory Visit (INDEPENDENT_AMBULATORY_CARE_PROVIDER_SITE_OTHER): Payer: Self-pay | Admitting: Obstetrics & Gynecology

## 2017-08-17 ENCOUNTER — Telehealth (HOSPITAL_COMMUNITY): Payer: Self-pay

## 2017-08-17 VITALS — BP 140/95 | HR 71

## 2017-08-17 DIAGNOSIS — O021 Missed abortion: Secondary | ICD-10-CM

## 2017-08-17 NOTE — Progress Notes (Signed)
Patient ID: Emma Page, female   DOB: January 03, 1986, 31 y.o.   MRN: [id number]  No chief complaint on file.   HPI Emma Page is a 31 y.o. female.  P3 (12, 14 and 32 yo kids) here today with an incomplete miscarriage. She tried cytotec a couple of weeks ago but she has not passed anything. HPI  Past Medical History:  Diagnosis Date  . Asthma   . Chlamydia   . Gonorrhea   . Hypertension   . Infection    UTI  . Pregnancy induced hypertension   . Trichomonas infection   . Vaginal Pap smear, abnormal    bx x3    Past Surgical History:  Procedure Laterality Date  . abortion    . DILATION AND CURETTAGE OF UTERUS    . 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 Hypertension Father   . Cancer Maternal Grandmother   . Heart disease Maternal Grandmother   . Cancer Paternal Grandmother   . Diabetes Paternal Grandfather   . Anesthesia problems Neg Hx     Social History Social History   Tobacco Use  . Smoking status: Never Smoker  . Smokeless tobacco: Never Used  Substance Use Topics  . Alcohol use: No    Comment: occasional-socially, 3-4 week; not while pregnant  . Drug use: No    No Known Allergies  Current Outpatient Medications  Medication Sig Dispense Refill  . hydrochlorothiazide (HYDRODIURIL) 25 MG tablet Take 25 mg by mouth daily.    Marland Kitchen. oxyCODONE-acetaminophen (PERCOCET/ROXICET) 5-325 MG tablet Take 1 tablet by mouth every 4 (four) hours as needed for severe pain. 20 tablet 0  . ibuprofen (ADVIL,MOTRIN) 800 MG tablet Take 1 tablet (800 mg total) by mouth every 8 (eight) hours as needed. (Patient not taking: Reported on 08/17/2017) 30 tablet 1  . metroNIDAZOLE (FLAGYL) 500 MG tablet Take 1 tablet (500 mg total) by mouth 2 (two) times daily. (Patient not taking: Reported on 08/17/2017) 14 tablet 2  . misoprostol (CYTOTEC) 200 MCG tablet Insert four tablets vaginally (Patient not taking:  Reported on 08/17/2017) 4 tablet 0   No current facility-administered medications for this visit.     Review of Systems Review of Systems  Blood pressure (!) 140/95, pulse 71, last menstrual period 06/04/2017, unknown if currently breastfeeding.  Physical Exam Physical Exam Well nourished, well hydrated Black female, no apparent distress Breathing, conversing, and ambulating normally  Data Reviewed IMPRESSION: Slightly irregular intrauterine gestational sac remains present, 6 weeks 2 days by mean sac diameter. No yolk sac or fetal pole visualized. This could be followed with repeat ultrasound in 10-14 days. No acute maternal findings.    Assessment    Incomplete ab    Plan    Schedule d&c I sent Rhea BleacherJacinda an email to schedule this Rec MVI daily Rec Gardasil at the health dept       Allie BossierMyra C Grier Czerwinski 08/17/2017, 11:15 AM

## 2017-08-17 NOTE — Telephone Encounter (Signed)
Called and spoke w/ [place], given surgery date & time. [facility]  (surgery scheduled for 1000). Nothing to eat or drink after midnight, remove all jewelry before arriving to the hospital. No lotions, no powder, no perfume, a little bit of deodorant is okay. Patient expressed understanding. Advised patient to give us a call back if she had any questions.

## 2017-08-17 NOTE — Telephone Encounter (Signed)
-----   Message from Allie BossierMyra C Dove, MD sent at 08/17/2017 11:18 AM EST ----- Please schedule her for a d&c with the appropriate person coving L&D asap. Thanks

## 2017-08-19 ENCOUNTER — Ambulatory Visit (HOSPITAL_COMMUNITY): Payer: Self-pay | Admitting: Anesthesiology

## 2017-08-19 ENCOUNTER — Ambulatory Visit (HOSPITAL_COMMUNITY)
Admission: RE | Admit: 2017-08-19 | Discharge: 2017-08-19 | Disposition: A | Discharge status: Home / Self Care | Payer: Self-pay | Source: Ambulatory Visit | Attending: Obstetrics & Gynecology | Admitting: Obstetrics & Gynecology

## 2017-08-19 ENCOUNTER — Encounter (HOSPITAL_COMMUNITY)
Admission: RE | Disposition: A | Discharge status: Home / Self Care | Payer: Self-pay | Source: Ambulatory Visit | Attending: Obstetrics & Gynecology

## 2017-08-19 ENCOUNTER — Encounter (HOSPITAL_COMMUNITY): Payer: Self-pay

## 2017-08-19 DIAGNOSIS — O021 Missed abortion: Secondary | ICD-10-CM | POA: Insufficient documentation

## 2017-08-19 DIAGNOSIS — I10 Essential (primary) hypertension: Secondary | ICD-10-CM | POA: Insufficient documentation

## 2017-08-19 HISTORY — PX: DILATION AND EVACUATION: SHX1459

## 2017-08-19 LAB — CBC
HCT: 37.2 % (ref 36.0–46.0)
HEMOGLOBIN: 12.3 g/dL (ref 12.0–15.0)
MCH: 29 pg (ref 26.0–34.0)
MCHC: 33.1 g/dL (ref 30.0–36.0)
MCV: 87.7 fL (ref 78.0–100.0)
PLATELETS: 400 10*3/uL (ref 150–400)
RBC: 4.24 MIL/uL (ref 3.87–5.11)
RDW: 13.4 % (ref 11.5–15.5)
WBC: 8 10*3/uL (ref 4.0–10.5)

## 2017-08-19 LAB — BASIC METABOLIC PANEL
ANION GAP: 8 (ref 5–15)
BUN: 16 mg/dL (ref 6–20)
CHLORIDE: 102 mmol/L (ref 101–111)
CO2: 25 mmol/L (ref 22–32)
Calcium: 8.8 mg/dL — ABNORMAL LOW (ref 8.9–10.3)
Creatinine, Ser: 0.92 mg/dL (ref 0.44–1.00)
GFR calc Af Amer: >60 mL/min (ref >60)
GLUCOSE: 87 mg/dL (ref 65–99)
POTASSIUM: 3.9 mmol/L (ref 3.5–5.1)
SODIUM: 135 mmol/L (ref 135–145)

## 2017-08-19 SURGERY — DILATION AND EVACUATION, UTERUS
Anesthesia: General

## 2017-08-19 MED ORDER — DOXYCYCLINE HYCLATE 100 MG PO CAPS: 100.0000 mg | ORAL_CAPSULE | Freq: Two times a day (BID) | ORAL | 0 refills | Status: DC

## 2017-08-19 MED ORDER — LIDOCAINE HCL (CARDIAC) 20 MG/ML IV SOLN
INTRAVENOUS | Status: AC
  Filled 2017-08-19: qty 5

## 2017-08-19 MED ORDER — DEXAMETHASONE SODIUM PHOSPHATE 4 MG/ML IJ SOLN
INTRAMUSCULAR | Status: DC | PRN
  Administered 2017-08-19: 4 mg via INTRAVENOUS

## 2017-08-19 MED ORDER — FENTANYL CITRATE (PF) 250 MCG/5ML IJ SOLN
INTRAMUSCULAR | Status: AC
  Filled 2017-08-19: qty 5

## 2017-08-19 MED ORDER — FENTANYL CITRATE (PF) 100 MCG/2ML IJ SOLN
INTRAMUSCULAR | Status: DC | PRN
Start: 2017-08-19 — End: 2017-08-19
  Administered 2017-08-19 (×2): 50 ug via INTRAVENOUS

## 2017-08-19 MED ORDER — LACTATED RINGERS IV SOLN
INTRAVENOUS | Status: DC
  Administered 2017-08-19: 10:00:00 via INTRAVENOUS

## 2017-08-19 MED ORDER — PROPOFOL 10 MG/ML IV BOLUS
INTRAVENOUS | Status: AC
  Filled 2017-08-19: qty 20

## 2017-08-19 MED ORDER — PROPOFOL 10 MG/ML IV BOLUS
INTRAVENOUS | Status: DC | PRN
  Administered 2017-08-19: 200 mg via INTRAVENOUS

## 2017-08-19 MED ORDER — GLYCOPYRROLATE 0.2 MG/ML IJ SOLN
INTRAMUSCULAR | Status: DC | PRN
  Administered 2017-08-19: 0.1 mg via INTRAVENOUS

## 2017-08-19 MED ORDER — ONDANSETRON HCL 4 MG/2ML IJ SOLN
INTRAMUSCULAR | Status: AC
  Filled 2017-08-19: qty 2

## 2017-08-19 MED ORDER — GLYCOPYRROLATE 0.2 MG/ML IJ SOLN
INTRAMUSCULAR | Status: AC
  Filled 2017-08-19: qty 1

## 2017-08-19 MED ORDER — LACTATED RINGERS IV SOLN: INTRAVENOUS | Status: DC

## 2017-08-19 MED ORDER — ONDANSETRON HCL 4 MG/2ML IJ SOLN
INTRAMUSCULAR | Status: DC | PRN
Start: 2017-08-19 — End: 2017-08-19
  Administered 2017-08-19: 4 mg via INTRAVENOUS

## 2017-08-19 MED ORDER — DOXYCYCLINE HYCLATE 100 MG PO TABS: 100.0000 mg | ORAL_TABLET | Freq: Once | ORAL | Status: DC

## 2017-08-19 MED ORDER — METOCLOPRAMIDE HCL 5 MG/ML IJ SOLN
10.0000 mg | Freq: Once | INTRAMUSCULAR | Status: DC | PRN
  Filled 2017-08-19: qty 2

## 2017-08-19 MED ORDER — FENTANYL CITRATE (PF) 100 MCG/2ML IJ SOLN
25.0000 ug | INTRAMUSCULAR | Status: DC | PRN
Start: 2017-08-19 — End: 2017-08-19

## 2017-08-19 MED ORDER — DEXAMETHASONE SODIUM PHOSPHATE 4 MG/ML IJ SOLN
INTRAMUSCULAR | Status: AC
  Filled 2017-08-19: qty 1

## 2017-08-19 MED ORDER — KETOROLAC TROMETHAMINE 30 MG/ML IJ SOLN
INTRAMUSCULAR | Status: AC
  Filled 2017-08-19: qty 1

## 2017-08-19 MED ORDER — RHO D IMMUNE GLOBULIN 1500 UNIT/2ML IJ SOSY
300.0000 ug | PREFILLED_SYRINGE | Freq: Once | INTRAMUSCULAR | Status: AC
  Administered 2017-08-19: 300 ug via INTRAMUSCULAR
  Filled 2017-08-19: qty 2

## 2017-08-19 MED ORDER — IBUPROFEN 600 MG PO TABS: 600.0000 mg | ORAL_TABLET | Freq: Four times a day (QID) | ORAL | 0 refills | Status: DC | PRN

## 2017-08-19 MED ORDER — LIDOCAINE HCL (CARDIAC) 20 MG/ML IV SOLN
INTRAVENOUS | Status: DC | PRN
  Administered 2017-08-19: 100 mg via INTRAVENOUS

## 2017-08-19 MED ORDER — KETOROLAC TROMETHAMINE 30 MG/ML IJ SOLN
INTRAMUSCULAR | Status: DC | PRN
  Administered 2017-08-19: 30 mg via INTRAVENOUS

## 2017-08-19 MED ORDER — RHO D IMMUNE GLOBULIN 1500 UNIT/2ML IJ SOSY
300.0000 ug | PREFILLED_SYRINGE | Freq: Once | INTRAMUSCULAR | Status: DC
  Filled 2017-08-19: qty 2

## 2017-08-19 MED ORDER — MEPERIDINE HCL 25 MG/ML IJ SOLN: 6.2500 mg | INTRAMUSCULAR | Status: DC | PRN

## 2017-08-19 MED ORDER — BUPIVACAINE HCL (PF) 0.5 % IJ SOLN
INTRAMUSCULAR | Status: DC | PRN
  Administered 2017-08-19: 20 mL

## 2017-08-19 MED ORDER — SCOPOLAMINE 1 MG/3DAYS TD PT72
1.0000 | MEDICATED_PATCH | Freq: Once | TRANSDERMAL | Status: DC
  Administered 2017-08-19: 1.5 mg via TRANSDERMAL

## 2017-08-19 MED ORDER — FENTANYL CITRATE (PF) 100 MCG/2ML IJ SOLN
INTRAMUSCULAR | Status: AC
  Filled 2017-08-19: qty 2

## 2017-08-19 MED ORDER — MIDAZOLAM HCL 2 MG/2ML IJ SOLN
INTRAMUSCULAR | Status: DC | PRN
  Administered 2017-08-19: 2 mg via INTRAVENOUS

## 2017-08-19 MED ORDER — MIDAZOLAM HCL 2 MG/2ML IJ SOLN
INTRAMUSCULAR | Status: AC
  Filled 2017-08-19: qty 2

## 2017-08-19 MED ORDER — SCOPOLAMINE 1 MG/3DAYS TD PT72
MEDICATED_PATCH | TRANSDERMAL | Status: AC
  Administered 2017-08-19: 1.5 mg via TRANSDERMAL
  Filled 2017-08-19: qty 1

## 2017-08-19 SURGICAL SUPPLY — 18 items
CATH ROBINSON RED A/P 16FR (CATHETERS) ×3 IMPLANT
DECANTER SPIKE VIAL GLASS SM (MISCELLANEOUS) ×3 IMPLANT
GLOVE BIOGEL PI IND STRL 7.0 (GLOVE) ×2 IMPLANT
GLOVE BIOGEL PI INDICATOR 7.0 (GLOVE) ×4
GLOVE ECLIPSE 7.0 STRL STRAW (GLOVE) ×3 IMPLANT
GOWN STRL REUS W/TWL LRG LVL3 (GOWN DISPOSABLE) ×3 IMPLANT
GOWN STRL REUS W/TWL XL LVL3 (GOWN DISPOSABLE) ×3 IMPLANT
KIT BERKELEY 1ST TRIMESTER 3/8 (MISCELLANEOUS) ×3 IMPLANT
NS IRRIG 1000ML POUR BTL (IV SOLUTION) ×3 IMPLANT
PACK VAGINAL MINOR WOMEN LF (CUSTOM PROCEDURE TRAY) ×3 IMPLANT
PAD OB MATERNITY 4.3X12.25 (PERSONAL CARE ITEMS) ×3 IMPLANT
PAD PREP 24X48 CUFFED NSTRL (MISCELLANEOUS) ×3 IMPLANT
SET BERKELEY SUCTION TUBING (SUCTIONS) ×3 IMPLANT
TOWEL OR 17X24 6PK STRL BLUE (TOWEL DISPOSABLE) ×6 IMPLANT
VACURETTE 10 RIGID CVD (CANNULA) IMPLANT
VACURETTE 7MM CVD STRL WRAP (CANNULA) ×2 IMPLANT
VACURETTE 8 RIGID CVD (CANNULA) IMPLANT
VACURETTE 9 RIGID CVD (CANNULA) IMPLANT

## 2017-08-19 NOTE — Op Note (Signed)
08/19/2017  10:36 AM  PATIENT:  Emma Page  31 y.o. female  PRE-OPERATIVE DIAGNOSIS:  Incomplete Miscarriage  POST-OPERATIVE DIAGNOSIS:  Incomplete Miscarriage  PROCEDURE:  Procedure(s): DILATATION AND EVACUATION (N/A)  SURGEON:  Surgeon(s) and Role:    * Willodean RosenthalHarraway-Smith, Concetta Guion, MD - Primary    * Moss, Amber, DO - Assisting  ANESTHESIA:   local and general  EBL:  10 mL   BLOOD ADMINISTERED:none  DRAINS: none   LOCAL MEDICATIONS USED:  MARCAINE     SPECIMEN:  Source of Specimen:  products of conception   DISPOSITION OF SPECIMEN:  PATHOLOGY  COUNTS:  YES  TOURNIQUET:  * No tourniquets in log *  DICTATION: .Note written in EPIC  PLAN OF CARE: Discharge to home after PACU  PATIENT DISPOSITION:  PACU - hemodynamically stable.   Delay start of Pharmacological VTE agent (>24hrs) due to surgical blood loss or risk of bleeding: yes  Complications: none immediate   INDICATIONS: 31 y.o. [id number] with MAB at [redacted] weeks gestation   Risks of surgery were discussed with the patient including but not limited to: bleeding which may require transfusion; infection which may require antibiotics; injury to uterus or surrounding organs; need for additional procedures including laparotomy or laparoscopy; possibility of intrauterine scarring which may impair future fertility; and other postoperative/anesthesia complications. Written informed consent was obtained.    FINDINGS:  A 6 week size uterus, moderate amounts of products of conception, specimen sent to pathology.  PROCEDURE DETAILS:  The patient was taken to the operating room where monitored intravenous sedation was administered and was found to be adequate.  After an adequate timeout was performed, she was placed in the dorsal lithotomy position and examined; then prepped and draped in the sterile manner.   Her bladder was catheterized for an unmeasured amount of clear, yellow urine. A vaginal speculum was then placed in  the patient's vagina and a single tooth tenaculum was applied to the anterior lip of the cervix.  A paracervical block using 20 ml of 0.5% Marcaine was administered. The cervix was gently dilated to accommodate a 7 mm curved suction curette that was gently advanced to the uterine fundus.  The suction device was then activated to a suction of 50-2460mmHg and curette slowly rotated to clear the uterus of products of conception.  A sharp curettage was then performed to confirm complete emptying of the uterus. There was minimal bleeding noted and the tenaculum removed with good hemostasis noted.   All instruments were removed from the patient's vagina. The patient tolerated the procedure well and was taken to the recovery area awake, and in stable condition.  The patient will be discharged to home as per PACU criteria.  Routine postoperative instructions given.  She was prescribed buprofen and Doxycycline.  She will follow up in the clinic in 4 weeks for postoperative evaluation.  Montrice Gracey L. Harraway-Smith, M.D., [facility]

## 2017-08-19 NOTE — Transfer of Care (Signed)
Immediate Anesthesia Transfer of Care Note  Patient: Emma Page  Procedure(s) Performed: DILATATION AND EVACUATION (N/A )  Patient Location: PACU  Anesthesia Type:General  Level of Consciousness: awake, alert  and oriented  Airway & Oxygen Therapy: Patient Spontanous Breathing and Patient connected to nasal cannula oxygen  Post-op Assessment: Report given to RN and Post -op Vital signs reviewed and stable  Post vital signs: Reviewed and stable  Last Vitals:  Vitals:   08/19/17 0924 08/19/17 0940  BP:  (!) 145/102  Pulse:  85  Resp: 18 16  Temp:  37.2 C  SpO2:  100%    Last Pain:  Vitals:   08/19/17 0940  TempSrc: Oral      Patients Stated Pain Goal: 5 (08/19/17 0940)  Complications: No apparent anesthesia complications

## 2017-08-19 NOTE — Anesthesia Procedure Notes (Signed)
Procedure Name: LMA Insertion Date/Time: 08/19/2017 10:08 AM Performed by: Graciela HusbandsFussell, Elvy Mclarty O, CRNA Pre-anesthesia Checklist: Patient identified, Patient being monitored, Emergency Drugs available, Timeout performed and Suction available Patient Re-evaluated:Patient Re-evaluated prior to induction Oxygen Delivery Method: Circle System Utilized and Circle system utilized Preoxygenation: Pre-oxygenation with 100% oxygen Induction Type: IV induction Ventilation: Mask ventilation without difficulty LMA: LMA inserted LMA Size: 4.0 Number of attempts: 1 Placement Confirmation: positive ETCO2 and breath sounds checked- equal and bilateral Tube secured with: Tape Dental Injury: Teeth and Oropharynx as per pre-operative assessment

## 2017-08-19 NOTE — Anesthesia Preprocedure Evaluation (Signed)
Anesthesia Evaluation  Patient identified by MRN, date of birth, ID band Patient awake    Reviewed: Allergy & Precautions, NPO status , Patient's Chart, lab work & pertinent test results  Airway Mallampati: II  TM Distance: >3 FB Neck ROM: Full    Dental no notable dental hx.    Pulmonary neg pulmonary ROS,    Pulmonary exam normal breath sounds clear to auscultation       Cardiovascular hypertension, negative cardio ROS Normal cardiovascular exam Rhythm:Regular Rate:Normal     Neuro/Psych negative neurological ROS  negative psych ROS   GI/Hepatic negative GI ROS, Neg liver ROS,   Endo/Other  negative endocrine ROS  Renal/GU negative Renal ROS  negative genitourinary   Musculoskeletal negative musculoskeletal ROS (+)   Abdominal   Peds negative pediatric ROS (+)  Hematology negative hematology ROS (+)   Anesthesia Other Findings   Reproductive/Obstetrics negative OB ROS                             Anesthesia Physical Anesthesia Plan  ASA: II  Anesthesia Plan: General   Post-op Pain Management:    Induction: Intravenous  PONV Risk Score and Plan: 1  Airway Management Planned: LMA  Additional Equipment:   Intra-op Plan:   Post-operative Plan: Extubation in OR  Informed Consent: I have reviewed the patients History and Physical, chart, labs and discussed the procedure including the risks, benefits and alternatives for the proposed anesthesia with the patient or authorized representative who has indicated his/her understanding and acceptance.   Dental advisory given  Plan Discussed with: CRNA  Anesthesia Plan Comments:         Anesthesia Quick Evaluation

## 2017-08-19 NOTE — H&P (Addendum)
Preoperative History and Physical  Emma Page is a 31 y.o. [id number] here for surgical management of missed abortion   Proposed surgery: suction D&E  Past Medical History:  Diagnosis Date  . Asthma   . Chlamydia   . Gonorrhea   . Hypertension   . Infection    UTI  . Pregnancy induced hypertension   . Trichomonas infection   . Vaginal Pap smear, abnormal    bx x3   Past Surgical History:  Procedure Laterality Date  . abortion    . DILATION AND CURETTAGE OF UTERUS    . 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 Medications Prior to Admission  Medication Sig Dispense Refill Last Dose  . hydrochlorothiazide (MICROZIDE) 12.5 MG capsule Take 12.5 mg daily by mouth.    08/18/2017 at Unknown time  . oxyCODONE-acetaminophen (PERCOCET/ROXICET) 5-325 MG tablet Take 1 tablet by mouth every 4 (four) hours as needed for severe pain. 20 tablet 0 Past Week at Unknown time    No Known Allergies Social History:   reports that  has never smoked. she has never used smokeless tobacco. She reports that she does not drink alcohol or use drugs. Family History  Problem Relation Age of Onset  . Hypertension Father   . Cancer Maternal Grandmother   . Heart disease Maternal Grandmother   . Cancer Paternal Grandmother   . Diabetes Paternal Grandfather   . Anesthesia problems Neg Hx     Review of Systems: Noncontributory  PHYSICAL EXAM: Height 5\' 5"  (1.651 m), weight 209 lb (94.8 kg), last menstrual period 06/04/2017, unknown if currently breastfeeding. General appearance - alert, well appearing, and in no distress Chest - clear to auscultation, no wheezes, rales or rhonchi, symmetric air entry Heart - normal rate and regular rhythm Abdomen - soft, nontender, nondistended, no masses or  organomegaly Pelvic - examination not indicated Extremities - peripheral pulses normal, no pedal edema, no clubbing or cyanosis  Labs: No results found for this or any previous visit (from the past 336 hour(s)).  Imaging Studies: US Ob Comp Less 14 Wks  Result Date: 07/26/2017 CLINICAL DATA:  Acute onset of vaginal bleeding.  Initial encounter. EXAM: OBSTETRIC <14 WK [place] AND TRANSVAGINAL OB US TECHNIQUE: Both transabdominal and transvaginal ultrasound examinations were performed for complete evaluation of the gestation as well as the maternal uterus, adnexal regions, and pelvic cul-de-sac. Transvaginal technique was performed to assess early pregnancy. COMPARISON:  Pelvic ultrasound performed 08/12/2016 FINDINGS: Intrauterine gestational sac: Single; somewhat irregular in shape. Yolk sac:  No Embryo:  No MSD: 1.26 cm   6 w   0  d Subchorionic hemorrhage:  None visualized. Maternal uterus/adnexae: The uterus is otherwise unremarkable in appearance. The ovaries are within normal limits. The right ovary measures 3.0 x 1.9 x 2.6 cm, while the left ovary measures 2.9 x 0.9 x 1.5 cm. No suspicious adnexal masses are seen; there is no evidence for ovarian torsion. No free fluid is seen within the pelvic cul-de-sac. IMPRESSION: Intrauterine gestational sac is somewhat irregular in shape. No yolk sac or embryo is yet seen. The mean sac diameter of 1.3 cm corresponds to a gestational age of [redacted] weeks 0 days, which does  not match the gestational age by LMP, though it remains too early to determine a new estimated date of delivery. If the quantitative beta HCG continues to trend upward, follow-up pelvic ultrasound could be performed in 2 weeks for further evaluation. Electronically Signed   By: Roanna RaiderJeffery  Chang M.D.   On: 07/26/2017 01:57   [place] Ob Transvaginal  Result Date: 07/28/2017 CLINICAL DATA:  Vaginal bleeding EXAM: TRANSVAGINAL OB ULTRASOUND TECHNIQUE: Transvaginal ultrasound was performed for complete  evaluation of the gestation as well as the maternal uterus, adnexal regions, and pelvic cul-de-sac. COMPARISON:  07/26/2017 FINDINGS: Intrauterine gestational sac: Single, remains slightly irregular. Yolk sac:  Not visualized Embryo:  Not visualized Cardiac Activity: Not visualized Heart Rate:  bpm MSD: 15.3  mm   6 w   2  d CRL:     mm    w  d                  US EDC: Subchorionic hemorrhage:  None visualized. Maternal uterus/adnexae: No adnexal mass.  Trace free fluid. IMPRESSION: Slightly irregular intrauterine gestational sac remains present, 6 weeks 2 days by mean sac diameter. No yolk sac or fetal pole visualized. This could be followed with repeat ultrasound in 10-14 days. No acute maternal findings. Electronically Signed   By: Charlett NoseKevin  Dover M.D.   On: 07/28/2017 14:32   [place] Ob Transvaginal  Result Date: 07/26/2017 CLINICAL DATA:  Acute onset of vaginal bleeding.  Initial encounter. EXAM: OBSTETRIC <14 WK US AND TRANSVAGINAL OB US TECHNIQUE: Both transabdominal and transvaginal ultrasound examinations were performed for complete evaluation of the gestation as well as the maternal uterus, adnexal regions, and pelvic cul-de-sac. Transvaginal technique was performed to assess early pregnancy. COMPARISON:  Pelvic ultrasound performed 08/12/2016 FINDINGS: Intrauterine gestational sac: Single; somewhat irregular in shape. Yolk sac:  No Embryo:  No MSD: 1.26 cm   6 w   0  d Subchorionic hemorrhage:  None visualized. Maternal uterus/adnexae: The uterus is otherwise unremarkable in appearance. The ovaries are within normal limits. The right ovary measures 3.0 x 1.9 x 2.6 cm, while the left ovary measures 2.9 x 0.9 x 1.5 cm. No suspicious adnexal masses are seen; there is no evidence for ovarian torsion. No free fluid is seen within the pelvic cul-de-sac. IMPRESSION: Intrauterine gestational sac is somewhat irregular in shape. No yolk sac or embryo is yet seen. The mean sac diameter of 1.3 cm corresponds to a  gestational age of [redacted] weeks 0 days, which does not match the gestational age by LMP, though it remains too early to determine a new estimated date of delivery. If the quantitative beta HCG continues to trend upward, follow-up pelvic ultrasound could be performed in 2 weeks for further evaluation. Electronically Signed   By: Roanna RaiderJeffery  Chang M.D.   On: 07/26/2017 01:57    Assessment: Missed abortion at 6 weeks  Plan: Patient will undergo surgical management with D&E.   The risks of surgery were discussed in detail with the patient including but not limited to: bleeding which may require transfusion or reoperation; infection which may require antibiotics; injury to surrounding organs which may involve bowel, bladder, ureters ; need for additional procedures including laparoscopy or laparotomy; thromboembolic phenomenon, surgical site problems and other postoperative/anesthesia complications. Likelihood of success in alleviating the patient's condition was discussed. Routine postoperative instructions will be reviewed with the patient and her family in detail after surgery.  The patient concurred with the proposed plan, giving informed written consent for  the surgery.  Patient has been NPO since last night she will remain NPO for procedure.  Anesthesia and OR aware.  Preoperative prophylactic antibiotics and SCDs ordered on call to the OR.  To OR when ready.  Ivah Girardot L. Erin FullingHarraway-Smith, M.D., [facility] 08/19/2017 9:29 AM

## 2017-08-19 NOTE — Discharge Instructions (Addendum)
Dilation and Curettage or Vacuum Curettage, Care After These instructions give you information about caring for yourself after your procedure. Your doctor may also give you more specific instructions. Call your doctor if you have any problems or questions after your procedure. Follow these instructions at home: Activity  Do not drive or use heavy machinery while taking prescription pain medicine.  For 24 hours after your procedure, avoid driving.  Take short walks often, followed by rest periods. Ask your doctor what activities are safe for you. After one or two days, you may be able to return to your normal activities.  Do not lift anything that is heavier than 10 lb (4.5 kg) until your doctor approves.  For at least 2 weeks, or as long as told by your doctor: ? Do not douche. ? Do not use tampons. ? Do not have sex. General instructions  Take over-the-counter and prescription medicines only as told by your doctor. This is very important if you take blood thinning medicine.  Do not take baths, swim, or use a hot tub until your doctor approves. Take showers instead of baths.  Wear compression stockings as told by your doctor.  It is up to you to get the results of your procedure. Ask your doctor when your results will be ready.  Keep all follow-up visits as told by your doctor. This is important. Contact a doctor if:  You have very bad cramps that get worse or do not get better with medicine.  You have very bad pain in your belly (abdomen).  You cannot drink fluids without throwing up (vomiting).  You get pain in a different part of the area between your belly and thighs (pelvis).  You have bad-smelling discharge from your vagina.  You have a rash. Get help right away if:  You are bleeding a lot from your vagina. A lot of bleeding means soaking more than one sanitary pad in an hour, for 2 hours in a row.  You have clumps of blood (blood clots) coming from your  vagina.  You have a fever or chills.  Your belly feels very tender or hard.  You have chest pain.  You have trouble breathing.  You cough up blood.  You feel dizzy.  You feel light-headed.  You pass out (faint).  You have pain in your neck or shoulder area. Summary  Take short walks often, followed by rest periods. Ask your doctor what activities are safe for you. After one or two days, you may be able to return to your normal activities.  Do not lift anything that is heavier than 10 lb (4.5 kg) until your doctor approves.  Do not take baths, swim, or use a hot tub until your doctor approves. Take showers instead of baths.  Contact your doctor if you have any symptoms of infection, like bad-smelling discharge from your vagina. This information is not intended to replace advice given to you by your health care provider. Make sure you discuss any questions you have with your health care provider. Document Released: 06/30/2008 Document Revised: 06/08/2016 Document Reviewed: 06/08/2016 Elsevier Interactive Patient Education  2017 [employer]   Post Anesthesia Home Care Instructions  NO IBUPROFEN PRODUCTS UNTIL:2:30 TODAY  Activity: Get plenty of rest for the remainder of the day. A responsible individual must stay with you for 24 hours following the procedure.  For the next 24 hours, DO NOT: -Drive a car -Advertising copywriterperate machinery -Drink alcoholic beverages -Take any medication unless instructed by your  physician -Make any legal decisions or sign important papers.  Meals: Start with liquid foods such as gelatin or soup. Progress to regular foods as tolerated. Avoid greasy, spicy, heavy foods. If nausea and/or vomiting occur, drink only clear liquids until the nausea and/or vomiting subsides. Call your physician if vomiting continues.  Special Instructions/Symptoms: Your throat may feel dry or sore from the anesthesia or the breathing tube placed in your throat during surgery.  If this causes discomfort, gargle with warm salt water. The discomfort should disappear within 24 hours.  If you had a scopolamine patch placed behind your ear for the management of post- operative nausea and/or vomiting:  1. The medication in the patch is effective for 72 hours, after which it should be removed.  Wrap patch in a tissue and discard in the trash. Wash hands thoroughly with soap and water. 2. You may remove the patch earlier than 72 hours if you experience unpleasant side effects which may include dry mouth, dizziness or visual disturbances. 3. Avoid touching the patch. Wash your hands with soap and water after contact with the patch.

## 2017-08-19 NOTE — Brief Op Note (Signed)
08/19/2017  10:36 AM  PATIENT:  Larey BrickBritiany N Tullo  31 y.o. female  PRE-OPERATIVE DIAGNOSIS:  Incomplete Miscarriage  POST-OPERATIVE DIAGNOSIS:  Incomplete Miscarriage  PROCEDURE:  Procedure(s): DILATATION AND EVACUATION (N/A)  SURGEON:  Surgeon(s) and Role:    * Willodean RosenthalHarraway-Smith, Jaycelynn Knickerbocker, MD - Primary    * Moss, Amber, DO - Assisting  ANESTHESIA:   local and general  EBL:  10 mL   BLOOD ADMINISTERED:none  DRAINS: none   LOCAL MEDICATIONS USED:  MARCAINE     SPECIMEN:  Source of Specimen:  products of conception   DISPOSITION OF SPECIMEN:  PATHOLOGY  COUNTS:  YES  TOURNIQUET:  * No tourniquets in log *  DICTATION: .Note written in EPIC  PLAN OF CARE: Discharge to home after PACU  PATIENT DISPOSITION:  PACU - hemodynamically stable.   Delay start of Pharmacological VTE agent (>24hrs) due to surgical blood loss or risk of bleeding: yes  Complications: none immediate  Mackinzee Roszak L. Harraway-Smith, M.D., [facility]

## 2017-08-19 NOTE — Anesthesia Postprocedure Evaluation (Signed)
Anesthesia Post Note  Patient: Emma Page  Procedure(s) Performed: DILATATION AND EVACUATION (N/A )     Patient location during evaluation: PACU Anesthesia Type: General Level of consciousness: awake and alert Pain management: pain level controlled Vital Signs Assessment: post-procedure vital signs reviewed and stable Respiratory status: spontaneous breathing, nonlabored ventilation, respiratory function stable and patient connected to nasal cannula oxygen Cardiovascular status: blood pressure returned to baseline and stable Postop Assessment: no apparent nausea or vomiting Anesthetic complications: no    Last Vitals:  Vitals:   08/19/17 1100 08/19/17 1115  BP: (!) 134/95 130/87  Pulse: 60 62  Resp: 15 15  Temp:    SpO2: 100% 100%    Last Pain:  Vitals:   08/19/17 1145  TempSrc:   PainSc: 3    Pain Goal: Patients Stated Pain Goal: 5 (08/19/17 1145)               Phillips Groutarignan, Daveda Larock

## 2017-08-20 ENCOUNTER — Encounter (HOSPITAL_COMMUNITY): Payer: Self-pay | Admitting: Obstetrics & Gynecology

## 2017-08-20 LAB — RH IG WORKUP (INCLUDES ABO/RH)
ABO/RH(D): A NEG
ANTIBODY SCREEN: POSITIVE
GESTATIONAL AGE(WKS): 6
Unit division: 0

## 2017-09-07 ENCOUNTER — Ambulatory Visit: Payer: Self-pay | Admitting: Obstetrics & Gynecology

## 2017-09-07 ENCOUNTER — Telehealth: Payer: Self-pay | Admitting: General Practice

## 2017-09-07 NOTE — Telephone Encounter (Signed)
Patient no showed for post op appt today. Called patient, no answer- left message stating we are calling as you missed your appt in our office today, please call our front office staff to reschedule. Will send letter

## 2017-10-29 ENCOUNTER — Other Ambulatory Visit: Payer: Self-pay | Admitting: Obstetrics & Gynecology

## 2017-11-16 ENCOUNTER — Encounter (HOSPITAL_COMMUNITY): Payer: Self-pay | Admitting: *Deleted

## 2017-11-16 ENCOUNTER — Other Ambulatory Visit: Payer: Self-pay

## 2017-11-16 ENCOUNTER — Inpatient Hospital Stay (HOSPITAL_COMMUNITY)
Admission: AD | Admit: 2017-11-16 | Discharge: 2017-11-16 | Disposition: A | Discharge status: Home / Self Care | Payer: Medicaid Other | Source: Ambulatory Visit | Attending: Obstetrics and Gynecology | Admitting: Obstetrics and Gynecology

## 2017-11-16 DIAGNOSIS — I10 Essential (primary) hypertension: Secondary | ICD-10-CM | POA: Insufficient documentation

## 2017-11-16 DIAGNOSIS — O10911 Unspecified pre-existing hypertension complicating pregnancy, first trimester: Secondary | ICD-10-CM

## 2017-11-16 DIAGNOSIS — Z3201 Encounter for pregnancy test, result positive: Secondary | ICD-10-CM

## 2017-11-16 DIAGNOSIS — N898 Other specified noninflammatory disorders of vagina: Secondary | ICD-10-CM

## 2017-11-16 DIAGNOSIS — O26891 Other specified pregnancy related conditions, first trimester: Secondary | ICD-10-CM | POA: Diagnosis not present

## 2017-11-16 DIAGNOSIS — O10919 Unspecified pre-existing hypertension complicating pregnancy, unspecified trimester: Secondary | ICD-10-CM

## 2017-11-16 LAB — WET PREP, GENITAL
Clue Cells Wet Prep HPF POC: NONE SEEN
Sperm: NONE SEEN
Trich, Wet Prep: NONE SEEN
Yeast Wet Prep HPF POC: NONE SEEN

## 2017-11-16 LAB — URINALYSIS, ROUTINE W REFLEX MICROSCOPIC
Bilirubin Urine: NEGATIVE
Glucose, UA: NEGATIVE mg/dL
Hgb urine dipstick: NEGATIVE
Ketones, ur: NEGATIVE mg/dL
Leukocytes, UA: NEGATIVE
Nitrite: NEGATIVE
Protein, ur: NEGATIVE mg/dL
Specific Gravity, Urine: 1.021 (ref 1.005–1.030)
pH: 5 (ref 5.0–8.0)

## 2017-11-16 LAB — POCT PREGNANCY, URINE: PREG TEST UR: POSITIVE — AB

## 2017-11-16 MED ORDER — LABETALOL HCL 100 MG PO TABS: 100.0000 mg | ORAL_TABLET | Freq: Two times a day (BID) | ORAL | 2 refills | Status: DC

## 2017-11-16 NOTE — MAU Note (Signed)
Pt reports white discharge for 2 weeks , used Monostat but she is still having the discharge. Denies vaginal itching or irritation.

## 2017-11-16 NOTE — MAU Provider Note (Addendum)
Chief Complaint: Vaginal Discharge and Possible Pregnancy   First Provider Initiated Contact with Patient 11/16/17 1053      SUBJECTIVE HPI: Emma Page is a 32 y.o. [id number] at [redacted]w[redacted]d by LMP who presents to maternity admissions reporting positive HPT and vaginal discharge. She reports vaginal discharge for 2 weeks. She describes vaginal discharge as white thick discharge. She denies vaginal itching or irritation. Has used Monostat but some relief but still complains of discharge.She denies vaginal bleeding, urinary symptoms, h/a, dizziness, n/v, or fever/chills. She has a +HPT but denies abdominal pain, vaginal bleeding or any concerns with pregnancy. She has chronic hypertension and was taking HCTZ but stopped taking it once she found out she was pregnant. Plans to go to [facility] for prenatal care.    Past Medical History:  Diagnosis Date  . Asthma   . Chlamydia   . Gonorrhea   . Hypertension   . Infection    UTI  . Pregnancy induced hypertension   . Trichomonas infection   . Vaginal Pap smear, abnormal    bx x3   Past Surgical History:  Procedure Laterality Date  . abortion    . DILATION AND CURETTAGE OF UTERUS    . DILATION AND EVACUATION N/A 08/19/2017   Procedure: DILATATION AND EVACUATION;  Surgeon: Willodean Rosenthal, MD;  Location: [facility];  Service: Gynecology;  Laterality: N/A;  . EYE SURGERY Right    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   Social History   Socioeconomic History  . Marital status: Single    Spouse name: Not on file  . Number of children: Not on file  . Years of education: Not on file  . Highest education level: Not on file  Social Needs  . Financial resource strain: Not on file  . Food insecurity - worry: Not on file  . Food insecurity - inability: Not on file  . Transportation needs - medical: Not on file  . Transportation needs - non-medical: Not on file  Occupational History  . Not on file  Tobacco Use  . Smoking status: Never  Smoker  . Smokeless tobacco: Never Used  Substance and Sexual Activity  . Alcohol use: No    Comment: occasional-socially, 3-4 week; not while pregnant  . Drug use: No  . Sexual activity: Yes    Birth control/protection: None  Other Topics Concern  . Not on file  Social History Narrative  . Not on file   No current facility-administered medications on file prior to encounter.    Current Outpatient Medications on File Prior to Encounter  Medication Sig Dispense Refill  . Multiple Vitamins-Minerals (MULTIVITAMIN WITH MINERALS) tablet Take 1 tablet by mouth daily.     No Known Allergies  ROS:  Review of Systems  Constitutional: Negative.   Respiratory: Negative.   Cardiovascular: Negative.   Gastrointestinal: Negative.   Genitourinary: Positive for vaginal discharge. Negative for difficulty urinating, dysuria, frequency, pelvic pain, urgency, vaginal bleeding and vaginal pain.  Neurological: Negative.   Psychiatric/Behavioral: Negative.    I have reviewed patient's Past Medical Hx, Surgical Hx, Family Hx, Social Hx, medications and allergies.   Physical Exam   Patient Vitals for the past 24 hrs:  BP Temp Temp src Pulse Resp SpO2 Height Weight  11/16/17 1116 (!) 137/94 - - 90 - - - -  11/16/17 1102 (!) 143/99 - - 86 - - - -  11/16/17 1049 (!) 138/100 - - 80 - - - -  11/16/17 1048 (!) 152/96 - -  83 - - - -  11/16/17 0857 (!) 141/93 98.1 F (36.7 C) Oral 81 16 100 % 5\' 5"  (1.651 m) 197 lb (89.4 kg)   Constitutional: Well-developed, well-nourished female in no acute distress.  Cardiovascular: normal rate Respiratory: normal effort GI: Abd soft, non-tender. Pos BS x 4 MS: Extremities nontender, no edema, normal ROM Neurologic: Alert and oriented x 4.  GU: Neg CVAT. PELVIC EXAM: scant white creamy discharge, vaginal walls and external genitalia normal  LAB RESULTS Results for orders placed or performed during the hospital encounter of 11/16/17 (from the past 24 hour(s))   Urinalysis, Routine w reflex microscopic     Status: None   Collection Time: 11/16/17  8:53 AM  Result Value Ref Range   Color, Urine YELLOW YELLOW   APPearance CLEAR CLEAR   Specific Gravity, Urine 1.021 1.005 - 1.030   pH 5.0 5.0 - 8.0   Glucose, UA NEGATIVE NEGATIVE mg/dL   Hgb urine dipstick NEGATIVE NEGATIVE   Bilirubin Urine NEGATIVE NEGATIVE   Ketones, ur NEGATIVE NEGATIVE mg/dL   Protein, ur NEGATIVE NEGATIVE mg/dL   Nitrite NEGATIVE NEGATIVE   Leukocytes, UA NEGATIVE NEGATIVE  Pregnancy, urine POC     Status: Abnormal   Collection Time: 11/16/17  9:07 AM  Result Value Ref Range   Preg Test, Ur POSITIVE (A) NEGATIVE  Wet prep, genital     Status: Abnormal   Collection Time: 11/16/17 10:00 AM  Result Value Ref Range   Yeast Wet Prep HPF POC NONE SEEN NONE SEEN   Trich, Wet Prep NONE SEEN NONE SEEN   Clue Cells Wet Prep HPF POC NONE SEEN NONE SEEN   WBC, Wet Prep HPF POC FEW (A) NONE SEEN   Sperm NONE SEEN     --/--/A NEG (11/15 [id number])  MAU Management/MDM: Orders Placed This Encounter  Procedures  . Wet prep, genital  . Urinalysis, Routine w reflex microscopic  . Pregnancy, urine POC  . Discharge patient Discharge disposition: 01-Home or Self Care; Discharge patient date: 11/16/2017  UA- negative  Wet prep- negative  GC/C-pending   Meds ordered this encounter  Medications  . labetalol (NORMODYNE) 100 MG tablet    Sig: Take 1 tablet (100 mg total) by mouth 2 (two) times daily.    Dispense:  60 tablet    Refill:  2    Order Specific Question:   Supervising Provider    Answer:   Gwenith Tschida [[id number]]   Pt discharged with strict hypertension precautions. Rx for labetalol 100mg  BID for management of chronic hypertension. Discussed vaginal discharge is normal during pregnancy- no need for continuation of Monistat.   ASSESSMENT 1. Positive pregnancy test   2. Vaginal discharge during pregnancy in first trimester   3. Chronic hypertension during pregnancy,  antepartum     PLAN Discharge home. Pt stable at time of discharge.  Rx for Labetalol  Make appointment to be seen in office for prenatal care  Return to MAU as needed for worsening symptoms, abdominal pain or vaginal bleeding.  Follow-up Information    [facility]. Schedule an appointment as soon as possible for a visit.   Specialty:  Obstetrics and Gynecology Why:  Make appointment to be seen for prenatal care Contact information: [address], [address] [place] [id number] [phone]          Allergies as of 11/16/2017   No Known Allergies     Medication List    TAKE these  medications   labetalol 100 MG tablet Commonly known as:  NORMODYNE Take 1 tablet (100 mg total) by mouth 2 (two) times daily.   multivitamin with minerals tablet Take 1 tablet by mouth daily.       Steward Drone  Certified Nurse-Midwife 11/16/2017  5:20 PM   Attestation of Attending Supervision of Advanced Practitioner (CNM/NP): Evaluation and management procedures were performed by the Advanced Practitioner under my supervision and collaboration.  I have reviewed the Advanced Practitioner's note and chart, and I agree with the management and plan.  Qunisha Bryk 11/17/2017 9:08 AM

## 2017-11-16 NOTE — MAU Note (Signed)
Pt. States she has had high blood pressure for about 12 years.  Stop taking BP medication because of pregnancy, took home pregnancy test. Serial BP started.

## 2017-11-16 NOTE — MAU Note (Signed)
Discharge instructions reviewed with patient, along with a list of OB/GYN patients, and safe medication sheet for pregnant women.  Pt. Verbalized understanding.  E-signature obtained.

## 2017-11-17 LAB — GC/CHLAMYDIA PROBE AMP (~~LOC~~) NOT AT ARMC
Chlamydia: NEGATIVE
Neisseria Gonorrhea: NEGATIVE

## 2017-12-12 ENCOUNTER — Inpatient Hospital Stay (HOSPITAL_COMMUNITY)
Admission: AD | Admit: 2017-12-12 | Discharge: 2017-12-12 | Disposition: A | Discharge status: Home / Self Care | Payer: Medicaid Other | Source: Ambulatory Visit | Attending: Obstetrics and Gynecology | Admitting: Obstetrics and Gynecology

## 2017-12-12 ENCOUNTER — Encounter (HOSPITAL_COMMUNITY): Payer: Self-pay

## 2017-12-12 DIAGNOSIS — Z3A22 22 weeks gestation of pregnancy: Secondary | ICD-10-CM | POA: Diagnosis not present

## 2017-12-12 DIAGNOSIS — O99511 Diseases of the respiratory system complicating pregnancy, first trimester: Secondary | ICD-10-CM | POA: Diagnosis present

## 2017-12-12 DIAGNOSIS — O219 Vomiting of pregnancy, unspecified: Secondary | ICD-10-CM | POA: Diagnosis not present

## 2017-12-12 DIAGNOSIS — Z9889 Other specified postprocedural states: Secondary | ICD-10-CM | POA: Diagnosis not present

## 2017-12-12 DIAGNOSIS — Z79899 Other long term (current) drug therapy: Secondary | ICD-10-CM | POA: Diagnosis not present

## 2017-12-12 DIAGNOSIS — O161 Unspecified maternal hypertension, first trimester: Secondary | ICD-10-CM | POA: Diagnosis not present

## 2017-12-12 DIAGNOSIS — Z8249 Family history of ischemic heart disease and other diseases of the circulatory system: Secondary | ICD-10-CM | POA: Insufficient documentation

## 2017-12-12 DIAGNOSIS — Z833 Family history of diabetes mellitus: Secondary | ICD-10-CM | POA: Insufficient documentation

## 2017-12-12 DIAGNOSIS — Z809 Family history of malignant neoplasm, unspecified: Secondary | ICD-10-CM | POA: Insufficient documentation

## 2017-12-12 DIAGNOSIS — J45909 Unspecified asthma, uncomplicated: Secondary | ICD-10-CM | POA: Diagnosis not present

## 2017-12-12 DIAGNOSIS — O218 Other vomiting complicating pregnancy: Secondary | ICD-10-CM | POA: Diagnosis not present

## 2017-12-12 LAB — COMPREHENSIVE METABOLIC PANEL
ALT: 11 U/L — ABNORMAL LOW (ref 14–54)
AST: 15 U/L (ref 15–41)
Albumin: 4.3 g/dL (ref 3.5–5.0)
Alkaline Phosphatase: 52 U/L (ref 38–126)
Anion gap: 10 (ref 5–15)
BUN: 10 mg/dL (ref 6–20)
CHLORIDE: 103 mmol/L (ref 101–111)
CO2: 21 mmol/L — AB (ref 22–32)
CREATININE: 0.75 mg/dL (ref 0.44–1.00)
Calcium: 9.2 mg/dL (ref 8.9–10.3)
GFR calc Af Amer: >60 mL/min (ref >60)
GLUCOSE: 78 mg/dL (ref 65–99)
Potassium: 3.8 mmol/L (ref 3.5–5.1)
SODIUM: 134 mmol/L — AB (ref 135–145)
Total Bilirubin: 2.1 mg/dL — ABNORMAL HIGH (ref 0.3–1.2)
Total Protein: 8 g/dL (ref 6.5–8.1)

## 2017-12-12 LAB — CBC WITH DIFFERENTIAL/PLATELET
Basophils Absolute: 0 10*3/uL (ref 0.0–0.1)
Basophils Relative: 0 %
EOS PCT: 2 %
Eosinophils Absolute: 0.2 10*3/uL (ref 0.0–0.7)
HCT: 35.2 % — ABNORMAL LOW (ref 36.0–46.0)
Hemoglobin: 12.4 g/dL (ref 12.0–15.0)
LYMPHS PCT: 29 %
Lymphs Abs: 2.7 10*3/uL (ref 0.7–4.0)
MCH: 29.2 pg (ref 26.0–34.0)
MCHC: 35.2 g/dL (ref 30.0–36.0)
MCV: 82.8 fL (ref 78.0–100.0)
Monocytes Absolute: 0.4 10*3/uL (ref 0.1–1.0)
Monocytes Relative: 4 %
Neutro Abs: 6.1 10*3/uL (ref 1.7–7.7)
Neutrophils Relative %: 65 %
PLATELETS: 368 10*3/uL (ref 150–400)
RBC: 4.25 MIL/uL (ref 3.87–5.11)
RDW: 13 % (ref 11.5–15.5)
WBC: 9.5 10*3/uL (ref 4.0–10.5)

## 2017-12-12 LAB — URINALYSIS, ROUTINE W REFLEX MICROSCOPIC
BACTERIA UA: NONE SEEN
Bilirubin Urine: NEGATIVE
Glucose, UA: NEGATIVE mg/dL
HGB URINE DIPSTICK: NEGATIVE
Ketones, ur: 80 mg/dL — AB
Leukocytes, UA: NEGATIVE
Nitrite: NEGATIVE
Protein, ur: 30 mg/dL — AB
Specific Gravity, Urine: 1.033 — ABNORMAL HIGH (ref 1.005–1.030)
pH: 5 (ref 5.0–8.0)

## 2017-12-12 LAB — LIPASE, BLOOD: Lipase: 23 U/L (ref 11–51)

## 2017-12-12 MED ORDER — PROMETHAZINE HCL 12.5 MG PO TABS: 12.5000 mg | ORAL_TABLET | Freq: Four times a day (QID) | ORAL | 0 refills | Status: DC | PRN

## 2017-12-12 MED ORDER — LACTATED RINGERS IV BOLUS (SEPSIS)
1000.0000 mL | Freq: Once | INTRAVENOUS | Status: AC
  Administered 2017-12-12: 1000 mL via INTRAVENOUS

## 2017-12-12 MED ORDER — PROMETHAZINE HCL 25 MG/ML IJ SOLN
25.0000 mg | Freq: Once | INTRAVENOUS | Status: AC
  Administered 2017-12-12: 25 mg via INTRAVENOUS
  Filled 2017-12-12: qty 1

## 2017-12-12 NOTE — Discharge Instructions (Signed)
Morning Sickness Morning sickness is when you feel sick to your stomach (nauseous) during pregnancy. You may feel sick to your stomach and throw up (vomit). You may feel sick in the morning, but you can feel this way any time of day. Some women feel very sick to their stomach and cannot stop throwing up (hyperemesis gravidarum). Follow these instructions at home:  Only take medicines as told by your doctor.  Take multivitamins as told by your doctor. Taking multivitamins before getting pregnant can stop or lessen the harshness of morning sickness.  Eat dry toast or unsalted crackers before getting out of bed.  Eat 5 to 6 small meals a day.  Eat dry and bland foods like rice and baked potatoes.  Do not drink liquids with meals. Drink between meals.  Do not eat greasy, fatty, or spicy foods.  Have someone cook for you if the smell of food causes you to feel sick or throw up.  If you feel sick to your stomach after taking prenatal vitamins, take them at night or with a snack.  Eat protein when you need a snack (nuts, yogurt, cheese).  Eat unsweetened gelatins for dessert.  Wear a bracelet used for sea sickness (acupressure wristband).  Go to a doctor that puts thin needles into certain body points (acupuncture) to improve how you feel.  Do not smoke.  Use a humidifier to keep the air in your house free of odors.  Get lots of fresh air. Contact a doctor if:  You need medicine to feel better.  You feel dizzy or lightheaded.  You are losing weight. Get help right away if:  You feel very sick to your stomach and cannot stop throwing up.  You pass out (faint). This information is not intended to replace advice given to you by your health care provider. Make sure you discuss any questions you have with your health care provider. Document Released: 10/29/2004 Document Revised: 02/27/2016 Document Reviewed: 03/08/2013 Elsevier Interactive Patient Education  2017 Elsevier  Inc.  Eating Plan for Nausea and vomiting in pregnancy Hyperemesis gravidarum is a severe form of morning sickness. Because this condition causes severe nausea and vomiting, it can lead to dehydration, malnutrition, and weight loss. One way to lessen the symptoms of nausea and vomiting is to follow the eating plan for hyperemesis gravidarum. It is often used along with prescribed medicines to control your symptoms. What can I do to relieve my symptoms? Listen to your body. Everyone is different and has different preferences. Find what works best for you. Take any of the following actions that are helpful to you:  Eat and drink slowly.  Eat 5-6 small meals daily instead of 3 large meals.  Eat crackers before you get out of bed in the morning.  Try having a snack in the middle of the night.  Starchy foods are usually tolerated well. Examples include cereal, toast, bread, potatoes, pasta, rice, and pretzels.  Ginger may help with nausea. Add  tsp ground ginger to hot tea or choose ginger tea.  Try drinking 100% fruit juice or an electrolyte drink. An electrolyte drink contains sodium, potassium, and chloride.  Continue to take your prenatal vitamins as told by your health care provider. If you are having trouble taking your prenatal vitamins, talk with your health care provider about different options.  Include at least 1 serving of protein with your meals and snacks. Protein options include meats or poultry, beans, nuts, eggs, and yogurt. Try eating a protein-rich  snack before bed. Examples of these snacks include cheese and crackers or half of a peanut butter or [place] sandwich.  Consider eliminating foods that trigger your symptoms. These may include spicy foods, coffee, high-fat foods, very sweet foods, and acidic foods.  Try meals that have more protein combined with bland, salty, lower-fat, and dry foods, such as nuts, seeds, pretzels, crackers, and cereal.  Talk with your healthcare  provider about starting a supplement of vitamin B6.  Have fluids that are cold, clear, and carbonated or sour. Examples include lemonade, ginger ale, lemon-lime soda, ice water, and sparkling water.  Try lemon or mint tea.  Try brushing your teeth or using a mouth rinse after meals.  What should I avoid to reduce my symptoms? Avoiding some of the following things may help reduce your symptoms.  Foods with strong smells. Try eating meals in well-ventilated areas that are free of odors.  Drinking water or other beverages with meals. Try not to drink anything during the 30 minutes before and after your meals.  Drinking more than 1 cup of fluid at a time. Sometimes using a straw helps.  Fried or high-fat foods, such as butter and cream sauces.  Spicy foods.  Skipping meals as best as you can. Nausea can be more intense on an empty stomach. If you cannot tolerate food at that time, do not force it. Try sucking on ice chips or other frozen items, and make up for missed calories later.  Lying down within 2 hours after eating.  Environmental triggers. These may include smoky rooms, closed spaces, rooms with strong smells, warm or humid places, overly loud and noisy rooms, and rooms with motion or flickering lights.  Quick and sudden changes in your movement.  This information is not intended to replace advice given to you by your health care provider. Make sure you discuss any questions you have with your health care provider. Document Released: 07/19/2007 Document Revised: 05/20/2016 Document Reviewed: 04/21/2016 Elsevier Interactive Patient Education  [employer]2018 Elsevier Inc.

## 2017-12-12 NOTE — MAU Note (Signed)
Has been vomiting for a couple weeks but this weekend it has been worse. Unable to eat or drink. Has tried zofran twice at home with no relief. Has had some diarrhea over the weekend, twice in the past 24 hours.

## 2017-12-12 NOTE — MAU Provider Note (Signed)
History     CSN: [id number]  Arrival date and time: 12/12/17 1650   First Provider Initiated Contact with Patient 12/12/17 1725      Chief Complaint  Patient presents with  . Emesis  . Nausea   HPI [name] is a 32 y.o. [id number] at [redacted]w[redacted]d who presents to MAU today with complaint of N/V and loose stools. The patient states that she has had N/V throughout the pregnancy, but worse this weekend. She has had 2 loose stools today. She denies fever or sick contacts.   OB History    Gravida Para Term Preterm AB Living   17 3 3  0 12 3   SAB TAB Ectopic Multiple Live Births   7 5 0 0 3      Past Medical History:  Diagnosis Date  . Asthma   . Chlamydia   . Gonorrhea   . Hypertension   . Infection    UTI  . Pregnancy induced hypertension   . Trichomonas infection   . Vaginal Pap smear, abnormal    bx x3    Past Surgical History:  Procedure Laterality Date  . abortion    . DILATION AND CURETTAGE OF UTERUS    . DILATION AND EVACUATION N/A 08/19/2017   Procedure: DILATATION AND EVACUATION;  Surgeon: Willodean Rosenthal, MD;  Location: [facility];  Service: Gynecology;  Laterality: N/A;  . 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 Hypertension Father   . Cancer Maternal Grandmother   . Heart disease Maternal Grandmother   . Cancer Paternal Grandmother   . Diabetes Paternal Grandfather   . Anesthesia problems Neg Hx     Social History   Tobacco Use  . Smoking status: Never Smoker  . Smokeless tobacco: Never Used  Substance Use Topics  . Alcohol use: No    Comment: occasional-socially, 3-4 week; not while pregnant  . Drug use: No    Allergies: No Known Allergies  Medications Prior to Admission  Medication Sig Dispense Refill Last Dose  . labetalol (NORMODYNE) 100 MG tablet Take 1 tablet (100 mg total) by mouth 2 (two) times daily. 60 tablet 2   . Multiple Vitamins-Minerals  (MULTIVITAMIN WITH MINERALS) tablet Take 1 tablet by mouth daily.   11/16/2017 at Unknown time    Review of Systems  Constitutional: Negative for fever.  Gastrointestinal: Positive for nausea and vomiting. Negative for abdominal pain, constipation and diarrhea.  Genitourinary: Negative for vaginal bleeding and vaginal discharge.   Physical Exam   Blood pressure (!) 142/99, pulse 76, temperature 98.4 F (36.9 C), temperature source Oral, resp. rate 18, height 5\' 5"  (1.651 m), weight 189 lb 12 oz (86.1 kg), last menstrual period 10/15/2017, unknown if currently breastfeeding.  Physical Exam  Nursing note and vitals reviewed. Constitutional: She is oriented to person, place, and time. She appears well-developed and well-nourished. No distress.  HENT:  Head: Normocephalic and atraumatic.  Cardiovascular: Normal rate.  Respiratory: Effort normal.  GI: Soft. Bowel sounds are normal. She exhibits no distension and no mass. There is no tenderness. There is no rebound and no guarding.  Neurological: She is alert and oriented to person, place, and time.  Skin: Skin is warm and dry. No erythema.  Psychiatric: She has a normal mood and affect.    Results for orders placed or performed during the hospital encounter of 12/12/17 (from the past 24 hour(s))  Urinalysis, Routine w reflex microscopic     Status: Abnormal   Collection Time: 12/12/17  4:54 PM  Result Value Ref Range   Color, Urine YELLOW YELLOW   APPearance HAZY (A) CLEAR   Specific Gravity, Urine 1.033 (H) 1.005 - 1.030   pH 5.0 5.0 - 8.0   Glucose, UA NEGATIVE NEGATIVE mg/dL   Hgb urine dipstick NEGATIVE NEGATIVE   Bilirubin Urine NEGATIVE NEGATIVE   Ketones, ur 80 (A) NEGATIVE mg/dL   Protein, ur 30 (A) NEGATIVE mg/dL   Nitrite NEGATIVE NEGATIVE   Leukocytes, UA NEGATIVE NEGATIVE   RBC / HPF 0-5 0 - 5 RBC/hpf   WBC, UA 0-5 0 - 5 WBC/hpf   Bacteria, UA NONE SEEN NONE SEEN   Squamous Epithelial / LPF 0-5 (A) NONE SEEN   Mucus  PRESENT   CBC with Differential/Platelet     Status: Abnormal   Collection Time: 12/12/17  6:04 PM  Result Value Ref Range   WBC 9.5 4.0 - 10.5 K/uL   RBC 4.25 3.87 - 5.11 MIL/uL   Hemoglobin 12.4 12.0 - 15.0 g/dL   HCT 16.135.2 (L) 09.636.0 - 04.546.0 %   MCV 82.8 78.0 - 100.0 fL   MCH 29.2 26.0 - 34.0 pg   MCHC 35.2 30.0 - 36.0 g/dL   RDW 40.913.0 81.111.5 - 91.415.5 %   Platelets 368 150 - 400 K/uL   Neutrophils Relative % 65 %   Neutro Abs 6.1 1.7 - 7.7 K/uL   Lymphocytes Relative 29 %   Lymphs Abs 2.7 0.7 - 4.0 K/uL   Monocytes Relative 4 %   Monocytes Absolute 0.4 0.1 - 1.0 K/uL   Eosinophils Relative 2 %   Eosinophils Absolute 0.2 0.0 - 0.7 K/uL   Basophils Relative 0 %   Basophils Absolute 0.0 0.0 - 0.1 K/uL  Comprehensive metabolic panel     Status: Abnormal   Collection Time: 12/12/17  6:04 PM  Result Value Ref Range   Sodium 134 (L) 135 - 145 mmol/L   Potassium 3.8 3.5 - 5.1 mmol/L   Chloride 103 101 - 111 mmol/L   CO2 21 (L) 22 - 32 mmol/L   Glucose, Bld 78 65 - 99 mg/dL   BUN 10 6 - 20 mg/dL   Creatinine, Ser 7.820.75 0.44 - 1.00 mg/dL   Calcium 9.2 8.9 - 95.610.3 mg/dL   Total Protein 8.0 6.5 - 8.1 g/dL   Albumin 4.3 3.5 - 5.0 g/dL   AST 15 15 - 41 U/L   ALT 11 (L) 14 - 54 U/L   Alkaline Phosphatase 52 38 - 126 U/L   Total Bilirubin 2.1 (H) 0.3 - 1.2 mg/dL   GFR calc non Af Amer >60 >60 mL/min   GFR calc Af Amer >60 >60 mL/min   Anion gap 10 5 - 15  Lipase, blood     Status: None   Collection Time: 12/12/17  6:04 PM  Result Value Ref Range   Lipase 23 11 - 51 U/L    MAU Course  Procedures None  MDM UA, CBC, CMP and Lipase today  UA shows significant dehydration IV LR with 25 mg Phenergan infusion given  Patient feeling better, however still likely dehydrated, 1 liter IV LR bolus given  Able to tolerate PO prior to discharge Assessment and Plan  A: Pregnant - [id number] Nausea and vomiting in pregnancy prior to [redacted] weeks gestation   P: Discharge home Rx for Phenergan given to  patient  First trimester  precautions discussed Patient advised to follow-up with [facility] to start prenatal care as planned or sooner PRN Patient may return to MAU as needed or if her condition were to change or worsen   Vonzella Nipple, PA-C 12/12/2017, 6:45 PM

## 2017-12-14 ENCOUNTER — Inpatient Hospital Stay (HOSPITAL_COMMUNITY)
Admission: AD | Admit: 2017-12-14 | Discharge: 2017-12-14 | Disposition: A | Discharge status: Home / Self Care | Payer: Medicaid Other | Source: Ambulatory Visit | Attending: Obstetrics & Gynecology | Admitting: Obstetrics & Gynecology

## 2017-12-14 ENCOUNTER — Other Ambulatory Visit: Payer: Self-pay

## 2017-12-14 ENCOUNTER — Inpatient Hospital Stay (HOSPITAL_COMMUNITY): Payer: Medicaid Other

## 2017-12-14 ENCOUNTER — Encounter (HOSPITAL_COMMUNITY): Payer: Self-pay | Admitting: *Deleted

## 2017-12-14 DIAGNOSIS — O3482 Maternal care for other abnormalities of pelvic organs, second trimester: Secondary | ICD-10-CM | POA: Insufficient documentation

## 2017-12-14 DIAGNOSIS — O119 Pre-existing hypertension with pre-eclampsia, unspecified trimester: Secondary | ICD-10-CM

## 2017-12-14 DIAGNOSIS — O26892 Other specified pregnancy related conditions, second trimester: Secondary | ICD-10-CM | POA: Insufficient documentation

## 2017-12-14 DIAGNOSIS — O26891 Other specified pregnancy related conditions, first trimester: Secondary | ICD-10-CM

## 2017-12-14 DIAGNOSIS — J45909 Unspecified asthma, uncomplicated: Secondary | ICD-10-CM | POA: Diagnosis not present

## 2017-12-14 DIAGNOSIS — Z3A08 8 weeks gestation of pregnancy: Secondary | ICD-10-CM | POA: Diagnosis not present

## 2017-12-14 DIAGNOSIS — O209 Hemorrhage in early pregnancy, unspecified: Secondary | ICD-10-CM | POA: Diagnosis present

## 2017-12-14 DIAGNOSIS — O161 Unspecified maternal hypertension, first trimester: Secondary | ICD-10-CM | POA: Diagnosis not present

## 2017-12-14 DIAGNOSIS — O30049 Twin pregnancy, dichorionic/diamniotic, unspecified trimester: Secondary | ICD-10-CM

## 2017-12-14 DIAGNOSIS — R109 Unspecified abdominal pain: Secondary | ICD-10-CM | POA: Insufficient documentation

## 2017-12-14 DIAGNOSIS — O99511 Diseases of the respiratory system complicating pregnancy, first trimester: Secondary | ICD-10-CM | POA: Diagnosis not present

## 2017-12-14 DIAGNOSIS — O30041 Twin pregnancy, dichorionic/diamniotic, first trimester: Secondary | ICD-10-CM

## 2017-12-14 DIAGNOSIS — N83202 Unspecified ovarian cyst, left side: Secondary | ICD-10-CM | POA: Insufficient documentation

## 2017-12-14 DIAGNOSIS — O09891 Supervision of other high risk pregnancies, first trimester: Secondary | ICD-10-CM | POA: Diagnosis not present

## 2017-12-14 DIAGNOSIS — Z6791 Unspecified blood type, Rh negative: Secondary | ICD-10-CM

## 2017-12-14 DIAGNOSIS — O10919 Unspecified pre-existing hypertension complicating pregnancy, unspecified trimester: Secondary | ICD-10-CM

## 2017-12-14 LAB — CBC
HCT: 31.8 % — ABNORMAL LOW (ref 36.0–46.0)
HEMOGLOBIN: 10.8 g/dL — AB (ref 12.0–15.0)
MCH: 28.6 pg (ref 26.0–34.0)
MCHC: 34 g/dL (ref 30.0–36.0)
MCV: 84.4 fL (ref 78.0–100.0)
Platelets: 325 10*3/uL (ref 150–400)
RBC: 3.77 MIL/uL — AB (ref 3.87–5.11)
RDW: 13.2 % (ref 11.5–15.5)
WBC: 8.1 10*3/uL (ref 4.0–10.5)

## 2017-12-14 LAB — WET PREP, GENITAL
Sperm: NONE SEEN
Trich, Wet Prep: NONE SEEN
WBC, Wet Prep HPF POC: NONE SEEN
YEAST WET PREP: NONE SEEN

## 2017-12-14 LAB — URINALYSIS, ROUTINE W REFLEX MICROSCOPIC
Bilirubin Urine: NEGATIVE
GLUCOSE, UA: NEGATIVE mg/dL
HGB URINE DIPSTICK: NEGATIVE
KETONES UR: NEGATIVE mg/dL
Leukocytes, UA: NEGATIVE
Nitrite: NEGATIVE
Protein, ur: NEGATIVE mg/dL
Specific Gravity, Urine: 1.017 (ref 1.005–1.030)
pH: 5 (ref 5.0–8.0)

## 2017-12-14 LAB — HCG, QUANTITATIVE, PREGNANCY: HCG, BETA CHAIN, QUANT, S: 150586 m[IU]/mL — AB (ref <5)

## 2017-12-14 MED ORDER — RHO D IMMUNE GLOBULIN 1500 UNIT/2ML IJ SOSY
300.0000 ug | PREFILLED_SYRINGE | Freq: Once | INTRAMUSCULAR | Status: AC
  Administered 2017-12-14: 300 ug via INTRAMUSCULAR
  Filled 2017-12-14: qty 2

## 2017-12-14 NOTE — MAU Provider Note (Signed)
Chief Complaint: Spotting and Abdominal Cramping   None     SUBJECTIVE HPI: Emma Page is a 32 y.o. [id number] at [redacted]w[redacted]d by LMP who presents to maternity admissions reporting light vaginal bleeding when wiping starting today. The bleeding is associated with mild cramping that is low in her abdomen, intermittent, and does not radiate.  There are no other associated symptoms. She was seen on 12/12/17 with nausea and vomiting but reports this is improved and she has not needed any nausea medications since then.  She has a new OB appointment at [facility] on 01/03/18.    HPI  Past Medical History:  Diagnosis Date  . Asthma   . Chlamydia   . Gonorrhea   . Hypertension   . Infection    UTI  . Pregnancy induced hypertension   . Trichomonas infection   . Vaginal Pap smear, abnormal    bx x3   Past Surgical History:  Procedure Laterality Date  . abortion    . DILATION AND CURETTAGE OF UTERUS    . DILATION AND EVACUATION N/A 08/19/2017   Procedure: DILATATION AND EVACUATION;  Surgeon: Willodean Rosenthal, MD;  Location: [facility];  Service: Gynecology;  Laterality: N/A;  . EYE SURGERY Right    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   Social History   Socioeconomic History  . Marital status: Single    Spouse name: Not on file  . Number of children: Not on file  . Years of education: Not on file  . Highest education level: Not on file  Social Needs  . Financial resource strain: Not on file  . Food insecurity - worry: Not on file  . Food insecurity - inability: Not on file  . Transportation needs - medical: Not on file  . Transportation needs - non-medical: Not on file  Occupational History  . Not on file  Tobacco Use  . Smoking status: Never Smoker  . Smokeless tobacco: Never Used  Substance and Sexual Activity  . Alcohol use: No    Comment: occasional-socially, 3-4 week; not while pregnant  . Drug use: No  . Sexual activity: Yes    Birth control/protection: None  Other Topics  Concern  . Not on file  Social History Narrative  . Not on file   No current facility-administered medications on file prior to encounter.    Current Outpatient Medications on File Prior to Encounter  Medication Sig Dispense Refill  . labetalol (NORMODYNE) 100 MG tablet Take 1 tablet (100 mg total) by mouth 2 (two) times daily. 60 tablet 2  . Multiple Vitamins-Minerals (MULTIVITAMIN WITH MINERALS) tablet Take 1 tablet by mouth daily.    . promethazine (PHENERGAN) 12.5 MG tablet Take 1 tablet (12.5 mg total) by mouth every 6 (six) hours as needed for nausea or vomiting. (Patient not taking: Reported on 12/14/2017) 30 tablet 0   No Known Allergies  ROS:  Review of Systems  Constitutional: Negative for chills, fatigue and fever.  Respiratory: Negative for shortness of breath.   Cardiovascular: Negative for chest pain.  Gastrointestinal: Negative for nausea and vomiting.  Genitourinary: Positive for pelvic pain and vaginal bleeding. Negative for difficulty urinating, dysuria, flank pain, vaginal discharge and vaginal pain.  Neurological: Negative for dizziness and headaches.  Psychiatric/Behavioral: Negative.      I have reviewed patient's Past Medical Hx, Surgical Hx, Family Hx, Social Hx, medications and allergies.   Physical Exam   Patient Vitals for the past 24 hrs:  BP Temp Temp src Pulse Resp  SpO2 Height Weight  12/14/17 1316 (!) 146/98 98.4 F (36.9 C) Oral 81 20 100 % - -  12/14/17 0930 (!) 134/92 97.9 F (36.6 C) Oral 83 18 100 % - -  12/14/17 1610 - - - - - - 5\' 5"  (1.651 m) 195 lb 4 oz (88.6 kg)   Constitutional: Well-developed, well-nourished female in no acute distress.  Cardiovascular: normal rate Respiratory: normal effort GI: Abd soft, non-tender. Pos BS x 4 MS: Extremities nontender, no edema, normal ROM Neurologic: Alert and oriented x 4.  GU: Neg CVAT.  PELVIC EXAM: Cervix pink, visually closed, without lesion, scant white creamy discharge, vaginal walls  and external genitalia normal Bimanual exam: Cervix 0/long/high, firm, anterior, neg CMT, uterus nontender, slightly enlarged, adnexa without tenderness, enlargement, or mass   LAB RESULTS Results for orders placed or performed during the hospital encounter of 12/14/17 (from the past 24 hour(s))  CBC     Status: Abnormal   Collection Time: 12/14/17  9:19 AM  Result Value Ref Range   WBC 8.1 4.0 - 10.5 K/uL   RBC 3.77 (L) 3.87 - 5.11 MIL/uL   Hemoglobin 10.8 (L) 12.0 - 15.0 g/dL   HCT 96.0 (L) 45.4 - 09.8 %   MCV 84.4 78.0 - 100.0 fL   MCH 28.6 26.0 - 34.0 pg   MCHC 34.0 30.0 - 36.0 g/dL   RDW 11.9 14.7 - 82.9 %   Platelets 325 150 - 400 K/uL  hCG, quantitative, pregnancy     Status: Abnormal   Collection Time: 12/14/17  9:19 AM  Result Value Ref Range   hCG, Beta Chain, Quant, S 150,586 (H) <5 mIU/mL  Rh IG workup (includes ABO/Rh)     Status: None (Preliminary result)   Collection Time: 12/14/17  9:20 AM  Result Value Ref Range   Gestational Age(Wks) 8    ABO/RH(D) A NEG    Antibody Screen NEG    Unit Number [id number]    Blood Component Type RHIG    Unit division 00    Status of Unit ISSUED    Transfusion Status      OK TO TRANSFUSE Performed at [facility], [address], [place], [place] [address]   Urinalysis, Routine w reflex microscopic     Status: Abnormal   Collection Time: 12/14/17  9:45 AM  Result Value Ref Range   Color, Urine YELLOW YELLOW   APPearance HAZY (A) CLEAR   Specific Gravity, Urine 1.017 1.005 - 1.030   pH 5.0 5.0 - 8.0   Glucose, UA NEGATIVE NEGATIVE mg/dL   Hgb urine dipstick NEGATIVE NEGATIVE   Bilirubin Urine NEGATIVE NEGATIVE   Ketones, ur NEGATIVE NEGATIVE mg/dL   Protein, ur NEGATIVE NEGATIVE mg/dL   Nitrite NEGATIVE NEGATIVE   Leukocytes, UA NEGATIVE NEGATIVE  Wet prep, genital     Status: Abnormal   Collection Time: 12/14/17 12:40 PM  Result Value Ref Range   Yeast Wet Prep HPF POC NONE SEEN NONE SEEN   Trich, Wet Prep  NONE SEEN NONE SEEN   Clue Cells Wet Prep HPF POC PRESENT (A) NONE SEEN   WBC, Wet Prep HPF POC NONE SEEN NONE SEEN   Sperm NONE SEEN     --/--/A NEG (03/12 0920)  IMAGING US Ob Comp Less 14 Wks  Result Date: 12/14/2017 CLINICAL DATA:  32 year old pregnant female presents with vaginal bleeding and 1 day of cramping. Quantitative beta HCG 150,586. EDC by LMP: 07/22/2018, projecting to an expected gestational age of [redacted]  weeks 4 days. EXAM: TWIN OBSTETRICAL ULTRASOUND <14 WKS COMPARISON:  No prior scans from this gestation. FINDINGS: Number of IUPs:  2 Chorionicity/Amnionicity: Dichorionic-diamniotic (thick intervening membrane) TWIN A Yolk sac:  Visualized. Fetus: Visualized. Fetal anatomy not assessed at this early gestational age. Fetal Cardiac Activity: Regular rate and rhythm Fetal Heart Rate: 184 bpm CRL:   20.6 mm   8 w 4 d                  [place] EDC: 07/22/2018 TWIN B Yolk sac:  Visualized. Fetus: Visualized. Fetal anatomy not assessed at this early gestational age. Fetal Cardiac Activity: Regular rate and rhythm Fetal Heart Rate: 176 bpm CRL:   22.7 mm   8 w 6 d                  [place] EDC: 07/20/2018 Subchorionic hemorrhage: Small perigestational bleed in the lower cavity involving less than 30% of the twin B gestational sac circumference. Maternal uterus/adnexae: No uterine fibroids. Right ovary measures 3.1 x 1.5 x 2.4 cm. Left ovary measures 4.4 x 2.7 x 2.9 cm and contains two subjacent simple cysts measuring 2.4 cm and 2.2 cm, respectively. Otherwise no adnexal masses. No abnormal free fluid in the pelvis. IMPRESSION: 1. Living dichorionic diamniotic intrauterine twin gestations with sonographic gestational ages concordant with the provided menstrual dating, as detailed. Normal cardiac activity in both twins. No significant fetal size or amniotic fluid discrepancy in the gestations. 2. Small perigestational bleed in the lower cavity. 3. Two subjacent simple left ovarian cysts, largest 2.4 cm. No  suspicious adnexal masses. Electronically Signed   By: Delbert Phenix M.D.   On: 12/14/2017 11:05   US Ob Comp Addl Gest Less 14 Wks  Result Date: 12/14/2017 CLINICAL DATA:  32 year old pregnant female presents with vaginal bleeding and 1 day of cramping. Quantitative beta HCG 150,586. EDC by LMP: 07/22/2018, projecting to an expected gestational age of [redacted] weeks 4 days. EXAM: TWIN OBSTETRICAL ULTRASOUND <14 WKS COMPARISON:  No prior scans from this gestation. FINDINGS: Number of IUPs:  2 Chorionicity/Amnionicity: Dichorionic-diamniotic (thick intervening membrane) TWIN A Yolk sac:  Visualized. Fetus: Visualized. Fetal anatomy not assessed at this early gestational age. Fetal Cardiac Activity: Regular rate and rhythm Fetal Heart Rate: 184 bpm CRL:   20.6 mm   8 w 4 d                  [place] EDC: 07/22/2018 TWIN B Yolk sac:  Visualized. Fetus: Visualized. Fetal anatomy not assessed at this early gestational age. Fetal Cardiac Activity: Regular rate and rhythm Fetal Heart Rate: 176 bpm CRL:   22.7 mm   8 w 6 d                  [place] EDC: 07/20/2018 Subchorionic hemorrhage: Small perigestational bleed in the lower cavity involving less than 30% of the twin B gestational sac circumference. Maternal uterus/adnexae: No uterine fibroids. Right ovary measures 3.1 x 1.5 x 2.4 cm. Left ovary measures 4.4 x 2.7 x 2.9 cm and contains two subjacent simple cysts measuring 2.4 cm and 2.2 cm, respectively. Otherwise no adnexal masses. No abnormal free fluid in the pelvis. IMPRESSION: 1. Living dichorionic diamniotic intrauterine twin gestations with sonographic gestational ages concordant with the provided menstrual dating, as detailed. Normal cardiac activity in both twins. No significant fetal size or amniotic fluid discrepancy in the gestations. 2. Small perigestational bleed in the lower cavity. 3. Two subjacent simple left ovarian  cysts, largest 2.4 cm. No suspicious adnexal masses. Electronically Signed   By: Delbert PhenixJason A Poff M.D.   On:  12/14/2017 11:05    MAU Management/MDM: CBC, quant hcg, UA, OB US US today shows live IUP of di/di twins gestation, dates c/w LMP dating.  Pt blood type is A neg so Rhogam work up ordered and Rhophylac x 1 dose given today due to recent bleeding.  Wet prep with clue cells so will treat for BV with Flagyl Rx.  Pt to f/u with [facility] as scheduled.  Pt to continue labetalol as prescribed on 11/16/17.  Return to MAU as needed for emergencies.  Pt discharged with strict bleeding precautions.  ASSESSMENT 1. Dichorionic diamniotic twin pregnancy in first trimester   2. Abdominal pain during pregnancy in first trimester   3. Rh negative state in antepartum period, first trimester   4. Vaginal bleeding in pregnancy, first trimester   5. Chronic hypertension during pregnancy, antepartum     PLAN Discharge home Allergies as of 12/14/2017   No Known Allergies     Medication List    TAKE these medications   labetalol 100 MG tablet Commonly known as:  NORMODYNE Take 1 tablet (100 mg total) by mouth 2 (two) times daily.   multivitamin with minerals tablet Take 1 tablet by mouth daily.   promethazine 12.5 MG tablet Commonly known as:  PHENERGAN Take 1 tablet (12.5 mg total) by mouth every 6 (six) hours as needed for nausea or vomiting.      Follow-up Information    [facility].   Why:  As scheduled 01/03/18.  Return to MAU as needed for emergencies. Contact information: [address] [place] [phone]          Sharen CounterLisa Leftwich-Kirby Certified Nurse-Midwife 12/14/2017  4:20 PM

## 2017-12-14 NOTE — MAU Note (Signed)
Pt presents with c/o abdominal cramping since the weekend.  Reports spotting with wiping began this morning.  Denies recent intercourse.

## 2017-12-15 LAB — RH IG WORKUP (INCLUDES ABO/RH)
ABO/RH(D): A NEG
Antibody Screen: NEGATIVE
GESTATIONAL AGE(WKS): 8
Unit division: 0

## 2017-12-15 LAB — HIV ANTIBODY (ROUTINE TESTING W REFLEX): HIV Screen 4th Generation wRfx: NONREACTIVE

## 2018-01-03 ENCOUNTER — Ambulatory Visit (INDEPENDENT_AMBULATORY_CARE_PROVIDER_SITE_OTHER): Payer: Medicaid Other | Admitting: Certified Nurse Midwife

## 2018-01-03 ENCOUNTER — Other Ambulatory Visit (HOSPITAL_COMMUNITY)
Admission: RE | Admit: 2018-01-03 | Discharge: 2018-01-03 | Disposition: A | Discharge status: Home / Self Care | Payer: Medicaid Other | Source: Ambulatory Visit | Attending: Certified Nurse Midwife | Admitting: Certified Nurse Midwife

## 2018-01-03 ENCOUNTER — Encounter: Payer: Self-pay | Admitting: Certified Nurse Midwife

## 2018-01-03 VITALS — BP 138/97 | HR 83 | Wt 191.2 lb

## 2018-01-03 DIAGNOSIS — B373 Candidiasis of vulva and vagina: Secondary | ICD-10-CM

## 2018-01-03 DIAGNOSIS — Z3A11 11 weeks gestation of pregnancy: Secondary | ICD-10-CM | POA: Diagnosis not present

## 2018-01-03 DIAGNOSIS — N760 Acute vaginitis: Secondary | ICD-10-CM

## 2018-01-03 DIAGNOSIS — O26891 Other specified pregnancy related conditions, first trimester: Secondary | ICD-10-CM

## 2018-01-03 DIAGNOSIS — Z8709 Personal history of other diseases of the respiratory system: Secondary | ICD-10-CM | POA: Insufficient documentation

## 2018-01-03 DIAGNOSIS — B3731 Acute candidiasis of vulva and vagina: Secondary | ICD-10-CM

## 2018-01-03 DIAGNOSIS — O0990 Supervision of high risk pregnancy, unspecified, unspecified trimester: Secondary | ICD-10-CM | POA: Diagnosis not present

## 2018-01-03 DIAGNOSIS — O219 Vomiting of pregnancy, unspecified: Secondary | ICD-10-CM

## 2018-01-03 DIAGNOSIS — Z6791 Unspecified blood type, Rh negative: Secondary | ICD-10-CM

## 2018-01-03 DIAGNOSIS — Z641 Problems related to multiparity: Secondary | ICD-10-CM

## 2018-01-03 DIAGNOSIS — O30041 Twin pregnancy, dichorionic/diamniotic, first trimester: Secondary | ICD-10-CM

## 2018-01-03 DIAGNOSIS — O0991 Supervision of high risk pregnancy, unspecified, first trimester: Secondary | ICD-10-CM | POA: Diagnosis not present

## 2018-01-03 DIAGNOSIS — O10919 Unspecified pre-existing hypertension complicating pregnancy, unspecified trimester: Secondary | ICD-10-CM

## 2018-01-03 DIAGNOSIS — B9689 Other specified bacterial agents as the cause of diseases classified elsewhere: Secondary | ICD-10-CM

## 2018-01-03 HISTORY — DX: Personal history of other diseases of the respiratory system: Z87.09

## 2018-01-03 MED ORDER — METRONIDAZOLE 0.75 % VA GEL: 1.0000 | Freq: Two times a day (BID) | VAGINAL | 0 refills | Status: DC

## 2018-01-03 MED ORDER — DOXYLAMINE-PYRIDOXINE 10-10 MG PO TBEC: DELAYED_RELEASE_TABLET | ORAL | 4 refills | Status: DC

## 2018-01-03 MED ORDER — TERCONAZOLE 0.8 % VA CREA: 1.0000 | TOPICAL_CREAM | Freq: Every day | VAGINAL | 0 refills | Status: DC

## 2018-01-03 MED ORDER — LABETALOL HCL 200 MG PO TABS: 200.0000 mg | ORAL_TABLET | Freq: Two times a day (BID) | ORAL | 3 refills | Status: DC

## 2018-01-03 MED ORDER — VITAFOL-NANO 18-0.6-0.4 MG PO TABS: 1.0000 | ORAL_TABLET | Freq: Every day | ORAL | 12 refills | Status: DC

## 2018-01-03 MED ORDER — ASPIRIN 81 MG PO CHEW: 81.0000 mg | CHEWABLE_TABLET | Freq: Every day | ORAL | 12 refills | Status: DC

## 2018-01-03 NOTE — Progress Notes (Signed)
Subjective:   Anjela Cassara is a 32 y.o. [id number] at 72w3dby LMP, early ultrasound being seen today for her first obstetrical visit.  Her obstetrical history is significant for obesity and CHTN, hx of asthma, grand multip, twin pregnancy. Patient does intend to breast feed. Pregnancy history fully reviewed.  Currently employed as hair braider.    Patient reports nausea, no bleeding, no contractions, no cramping, no leaking and vomiting.  HISTORY: OB History  Gravida Para Term Preterm AB Living  '17 3 3 ' 0 12 3  SAB TAB Ectopic Multiple Live Births  7 5 0 0 3    # Outcome Date GA Lbr Len/2nd Weight Sex Delivery Anes PTL Lv  17 Current           16 Term 07/04/05    F Vag-Spont   LIV  15 Term 11/09/02    M Vag-Spont   LIV  14 Term 12/24/00    F Vag-Spont   LIV  13 Gravida           12 SAB           11 SAB           10 SAB           9 SAB           8 TAB           7 TAB           6 TAB           5 TAB           4 TAB           3 SAB           2 SAB           1 SAB             Last pap smear was done unknown.   Past Medical History:  Diagnosis Date  . Asthma   . Chlamydia   . Gonorrhea   . Hypertension   . Infection    UTI  . Pregnancy induced hypertension   . Trichomonas infection   . Vaginal Pap smear, abnormal    bx x3   Past Surgical History:  Procedure Laterality Date  . abortion    . DILATION AND CURETTAGE OF UTERUS    . DILATION AND EVACUATION N/A 08/19/2017   Procedure: DILATATION AND EVACUATION;  Surgeon: HLavonia Drafts MD;  [facility];  Service: Gynecology;  Laterality: N/A;  . EYE SURGERY Right    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   Family History  Problem Relation Age of Onset  . Hypertension Father   . Cancer Maternal Grandmother   . Heart disease Maternal Grandmother   . Cancer Paternal Grandmother   . Diabetes Paternal Grandfather   . Anesthesia problems Neg Hx    Social History   Tobacco Use  . Smoking status:  Never Smoker  . Smokeless tobacco: Never Used  Substance Use Topics  . Alcohol use: No    Comment: occasional-socially, 3-4 week; not while pregnant  . Drug use: No   No Known Allergies Current Outpatient Medications on File Prior to Visit  Medication Sig Dispense Refill  . promethazine (PHENERGAN) 12.5 MG tablet Take 1 tablet (12.5 mg total) by mouth every 6 (six) hours as needed for nausea or vomiting. 30 tablet 0  . Multiple Vitamins-Minerals (MULTIVITAMIN WITH MINERALS) tablet Take 1  tablet by mouth daily.     No current facility-administered medications on file prior to visit.     Review of Systems Pertinent items noted in HPI and remainder of comprehensive ROS otherwise negative.  Exam   Vitals:   01/03/18 1034 01/03/18 1035  BP: (!) 136/93 (!) 138/97  Pulse: 81 83  Weight: 191 lb 3.2 oz (86.7 kg)    Fetal Heart Rate (bpm): 790;240; doppler  Uterus:     Pelvic Exam: Perineum: no hemorrhoids, normal perineum   Vulva: normal external genitalia, no lesions   Vagina:  normal mucosa, normal discharge   Cervix: no lesions and normal, pap smear done.    Adnexa: normal adnexa and no mass, fullness, tenderness   Bony Pelvis: average  System: General: well-developed, well-nourished female in no acute distress   Breast:  normal appearance, no masses or tenderness   Skin: normal coloration and turgor, no rashes   Neurologic: oriented, normal, negative, normal mood   Extremities: normal strength, tone, and muscle mass, ROM of all joints is normal   HEENT PERRLA, extraocular movement intact and sclera clear, anicteric   Mouth/Teeth mucous membranes moist, pharynx normal without lesions and dental hygiene good   Neck supple and no masses   Cardiovascular: regular rate and rhythm   Respiratory:  no respiratory distress, normal breath sounds   Abdomen: soft, non-tender; bowel sounds normal; no masses,  no organomegaly     Assessment:   Pregnancy: [id number] Patient Active  Problem List   Diagnosis Date Noted  . Supervision of high risk pregnancy, antepartum 01/03/2018  . [place] multiparity 01/03/2018  . History of asthma 01/03/2018  . Rh negative state in antepartum period, first trimester 12/14/2017  . Dichorionic diamniotic twin pregnancy in first trimester 12/14/2017  . Chronic hypertension during pregnancy, antepartum 12/14/2017  . Missed abortion 08/19/2017     Plan:  1. Rh negative state in antepartum period, first trimester     2. Dichorionic diamniotic twin pregnancy in first trimester    - [place] MFM OB DETAIL ADDL GEST +14 WK; Future  3. Chronic hypertension during pregnancy, antepartum      POC reviewed with Dr. Elly Modena - aspirin 81 MG chewable tablet; Chew 1 tablet (81 mg total) by mouth daily.  Dispense: 30 tablet; Refill: 12 - labetalol (NORMODYNE) 200 MG tablet; Take 1 tablet (200 mg total) by mouth 2 (two) times daily.  Dispense: 60 tablet; Refill: 3 - Comp Met (CMET) - Protein / creatinine ratio, urine  4. Supervision of high risk pregnancy, antepartum    - Cervicovaginal ancillary only - Cytology - PAP - Obstetric Panel, Including HIV - Hemoglobinopathy evaluation - Vitamin D (25 hydroxy) - HgB A1c - Cystic Fibrosis Mutation 97 - Genetic Screening - Culture, OB Urine - [place] MFM OB DETAIL +14 WK; Future - [place] MFM OB DETAIL ADDL GEST +14 WK; Future - Prenatal-Fe Fum-Methf-FA w/o A (VITAFOL-NANO) 18-0.6-0.4 MG TABS; Take 1 tablet by mouth daily.  Dispense: 30 tablet; Refill: 12  5. Nausea/vomiting in pregnancy     - Doxylamine-Pyridoxine (DICLEGIS) 10-10 MG TBEC; Take 1 tablet with breakfast and lunch.  Take 2 tablets at bedtime.  Dispense: 100 tablet; Refill: 4   6. Yeast vaginitis    - terconazole (TERAZOL 3) 0.8 % vaginal cream; Place 1 applicator vaginally at bedtime.  Dispense: 20 g; Refill: 0  7. BV (bacterial vaginosis)    - metroNIDAZOLE (METROGEL VAGINAL) 0.75 % vaginal gel; Place 1 Applicatorful vaginally 2 (two)  times  daily.  Dispense: 70 g; Refill: 0  8. [place] multiparity        9. History of asthma       Initial labs drawn. Continue prenatal vitamins. Genetic Screening discussed, NIPS: ordered. Ultrasound discussed; fetal anatomic survey: ordered. Problem list reviewed and updated. The nature of [facility] with multiple MDs and other Advanced Practice Providers was explained to patient; also emphasized that residents, students are part of our team. Routine obstetric precautions reviewed. Return in about 1 month (around 01/31/2018) for [facility], Needs to see FP MD here.     Kandis Cocking, [place] for [employer], [place]

## 2018-01-03 NOTE — Progress Notes (Signed)
n

## 2018-01-03 NOTE — Progress Notes (Signed)
Patient is in the office for NOB appt, planned pregnancy, FOB is involved but they are not in a relationship. Pt denies pain today.

## 2018-01-04 LAB — CERVICOVAGINAL ANCILLARY ONLY
BACTERIAL VAGINITIS: POSITIVE — AB
CANDIDA VAGINITIS: NEGATIVE
CHLAMYDIA, DNA PROBE: NEGATIVE
NEISSERIA GONORRHEA: NEGATIVE
Trichomonas: NEGATIVE

## 2018-01-05 ENCOUNTER — Other Ambulatory Visit: Payer: Self-pay | Admitting: Certified Nurse Midwife

## 2018-01-05 ENCOUNTER — Telehealth: Payer: Self-pay

## 2018-01-05 DIAGNOSIS — Z8709 Personal history of other diseases of the respiratory system: Secondary | ICD-10-CM

## 2018-01-05 LAB — CYTOLOGY - PAP
Diagnosis: NEGATIVE
HPV: NOT DETECTED

## 2018-01-05 LAB — CULTURE, OB URINE

## 2018-01-05 LAB — URINE CULTURE, OB REFLEX

## 2018-01-05 MED ORDER — ALBUTEROL SULFATE HFA 108 (90 BASE) MCG/ACT IN AERS: 2.0000 | INHALATION_SPRAY | Freq: Four times a day (QID) | RESPIRATORY_TRACT | 1 refills | Status: DC | PRN

## 2018-01-05 NOTE — Telephone Encounter (Signed)
Pt called stating that she is staring to have issues with her breathing due to allergies. She has a history of asthma. She states that she is getting short of breath with walking. Pt is requesting rx for sinuses and a inhaler.

## 2018-01-05 NOTE — Telephone Encounter (Signed)
Please let her know that I have sent in albuterol for her to use.  The antihistamines she can use OTC any brand that she would like including Benadryl she can take 2 every 6 hours.  Since the antihistamines are OTC insurance will not pay for them.   Blessings; [place]le Merrel Crabbe, CNM

## 2018-01-06 NOTE — Telephone Encounter (Signed)
Pt informed

## 2018-01-10 ENCOUNTER — Other Ambulatory Visit: Payer: Self-pay | Admitting: Certified Nurse Midwife

## 2018-01-10 DIAGNOSIS — O0990 Supervision of high risk pregnancy, unspecified, unspecified trimester: Secondary | ICD-10-CM

## 2018-01-10 LAB — OBSTETRIC PANEL, INCLUDING HIV
BASOS: 0 %
Basophils Absolute: 0 10*3/uL (ref 0.0–0.2)
EOS (ABSOLUTE): 0.2 10*3/uL (ref 0.0–0.4)
Eos: 3 %
HEMATOCRIT: 32 % — AB (ref 34.0–46.6)
HEP B S AG: NEGATIVE
HIV SCREEN 4TH GENERATION: NONREACTIVE
Hemoglobin: 10.3 g/dL — ABNORMAL LOW (ref 11.1–15.9)
Immature Grans (Abs): 0 10*3/uL (ref 0.0–0.1)
Immature Granulocytes: 0 %
Lymphocytes Absolute: 2.2 10*3/uL (ref 0.7–3.1)
Lymphs: 29 %
MCH: 27.9 pg (ref 26.6–33.0)
MCHC: 32.2 g/dL (ref 31.5–35.7)
MCV: 87 fL (ref 79–97)
MONOCYTES: 5 %
Monocytes Absolute: 0.4 10*3/uL (ref 0.1–0.9)
NEUTROS ABS: 4.6 10*3/uL (ref 1.4–7.0)
Neutrophils: 63 %
PLATELETS: 379 10*3/uL (ref 150–379)
RBC: 3.69 x10E6/uL — ABNORMAL LOW (ref 3.77–5.28)
RDW: 13.5 % (ref 12.3–15.4)
RPR: NONREACTIVE
RUBELLA: 0.99 {index} — AB (ref >0.99)
Rh Factor: NEGATIVE
WBC: 7.4 10*3/uL (ref 3.4–10.8)

## 2018-01-10 LAB — COMPREHENSIVE METABOLIC PANEL
ALBUMIN: 4.3 g/dL (ref 3.5–5.5)
ALT: 6 IU/L (ref 0–32)
AST: 10 IU/L (ref 0–40)
Albumin/Globulin Ratio: 1.8 (ref 1.2–2.2)
Alkaline Phosphatase: 51 IU/L (ref 39–117)
BILIRUBIN TOTAL: 0.8 mg/dL (ref 0.0–1.2)
BUN / CREAT RATIO: 12 (ref 9–23)
BUN: 8 mg/dL (ref 6–20)
CALCIUM: 8.8 mg/dL (ref 8.7–10.2)
CO2: 20 mmol/L (ref 20–29)
CREATININE: 0.69 mg/dL (ref 0.57–1.00)
Chloride: 102 mmol/L (ref 96–106)
GFR, EST AFRICAN AMERICAN: 133 mL/min/{1.73_m2} (ref >59)
GFR, EST NON AFRICAN AMERICAN: 116 mL/min/{1.73_m2} (ref >59)
Globulin, Total: 2.4 g/dL (ref 1.5–4.5)
Glucose: 78 mg/dL (ref 65–99)
Potassium: 4.2 mmol/L (ref 3.5–5.2)
Sodium: 137 mmol/L (ref 134–144)
TOTAL PROTEIN: 6.7 g/dL (ref 6.0–8.5)

## 2018-01-10 LAB — CYSTIC FIBROSIS MUTATION 97: Interpretation: NOT DETECTED

## 2018-01-10 LAB — HEMOGLOBINOPATHY EVALUATION
HEMOGLOBIN A2 QUANTITATION: 2.6 % (ref 1.8–3.2)
HGB C: 0 %
HGB S: 0 %
HGB VARIANT: 0 %
Hemoglobin F Quantitation: 0 % (ref 0.0–2.0)
Hgb A: 97.4 % (ref 96.4–98.8)

## 2018-01-10 LAB — PROTEIN / CREATININE RATIO, URINE
Creatinine, Urine: 163.3 mg/dL
PROTEIN/CREAT RATIO: 84 mg/g{creat} (ref 0–200)
Protein, Ur: 13.7 mg/dL

## 2018-01-10 LAB — AB SCR+ANTIBODY ID: ANTIBODY SCREEN: POSITIVE — AB

## 2018-01-10 LAB — VITAMIN D 25 HYDROXY (VIT D DEFICIENCY, FRACTURES): Vit D, 25-Hydroxy: 8.3 ng/mL — ABNORMAL LOW (ref 30.0–100.0)

## 2018-01-10 LAB — HEMOGLOBIN A1C
Est. average glucose Bld gHb Est-mCnc: 108 mg/dL
HEMOGLOBIN A1C: 5.4 % (ref 4.8–5.6)

## 2018-01-11 ENCOUNTER — Other Ambulatory Visit: Payer: Self-pay | Admitting: Certified Nurse Midwife

## 2018-01-11 DIAGNOSIS — O99019 Anemia complicating pregnancy, unspecified trimester: Secondary | ICD-10-CM

## 2018-01-11 DIAGNOSIS — O9989 Other specified diseases and conditions complicating pregnancy, childbirth and the puerperium: Secondary | ICD-10-CM

## 2018-01-11 DIAGNOSIS — O0990 Supervision of high risk pregnancy, unspecified, unspecified trimester: Secondary | ICD-10-CM

## 2018-01-11 DIAGNOSIS — Z283 Underimmunization status: Secondary | ICD-10-CM | POA: Insufficient documentation

## 2018-01-11 DIAGNOSIS — O09899 Supervision of other high risk pregnancies, unspecified trimester: Secondary | ICD-10-CM

## 2018-01-11 DIAGNOSIS — E559 Vitamin D deficiency, unspecified: Secondary | ICD-10-CM | POA: Insufficient documentation

## 2018-01-11 DIAGNOSIS — Z2839 Other underimmunization status: Secondary | ICD-10-CM

## 2018-01-11 DIAGNOSIS — O99011 Anemia complicating pregnancy, first trimester: Secondary | ICD-10-CM

## 2018-01-11 DIAGNOSIS — O360111 Maternal care for anti-D [Rh] antibodies, first trimester, fetus 1: Secondary | ICD-10-CM

## 2018-01-11 HISTORY — DX: Other underimmunization status: Z28.39

## 2018-01-11 HISTORY — DX: Anemia complicating pregnancy, unspecified trimester: O99.019

## 2018-01-11 HISTORY — DX: Supervision of other high risk pregnancies, unspecified trimester: O09.899

## 2018-01-11 HISTORY — DX: Vitamin D deficiency, unspecified: E55.9

## 2018-01-11 MED ORDER — CITRANATAL BLOOM 90-1 MG PO TABS: 1.0000 | ORAL_TABLET | Freq: Every day | ORAL | 12 refills | Status: DC

## 2018-01-31 ENCOUNTER — Encounter: Payer: Self-pay | Admitting: *Deleted

## 2018-01-31 ENCOUNTER — Ambulatory Visit (INDEPENDENT_AMBULATORY_CARE_PROVIDER_SITE_OTHER): Payer: Medicaid Other | Admitting: Obstetrics and Gynecology

## 2018-01-31 ENCOUNTER — Encounter: Payer: Self-pay | Admitting: Obstetrics and Gynecology

## 2018-01-31 VITALS — BP 131/90 | HR 76 | Wt 196.0 lb

## 2018-01-31 DIAGNOSIS — Z6791 Unspecified blood type, Rh negative: Secondary | ICD-10-CM

## 2018-01-31 DIAGNOSIS — O30041 Twin pregnancy, dichorionic/diamniotic, first trimester: Secondary | ICD-10-CM

## 2018-01-31 DIAGNOSIS — O10919 Unspecified pre-existing hypertension complicating pregnancy, unspecified trimester: Secondary | ICD-10-CM

## 2018-01-31 DIAGNOSIS — Z2839 Other underimmunization status: Secondary | ICD-10-CM

## 2018-01-31 DIAGNOSIS — Z283 Underimmunization status: Secondary | ICD-10-CM

## 2018-01-31 DIAGNOSIS — O9989 Other specified diseases and conditions complicating pregnancy, childbirth and the puerperium: Secondary | ICD-10-CM

## 2018-01-31 DIAGNOSIS — O26891 Other specified pregnancy related conditions, first trimester: Secondary | ICD-10-CM

## 2018-01-31 DIAGNOSIS — O0990 Supervision of high risk pregnancy, unspecified, unspecified trimester: Secondary | ICD-10-CM

## 2018-01-31 NOTE — Progress Notes (Signed)
   PRENATAL VISIT NOTE  Subjective:  Emma Page is a 32 y.o. [id number] at [redacted]w[redacted]d being seen today for ongoing prenatal care.  She is currently monitored for the following issues for this high-risk pregnancy and has Missed abortion; Rh negative state in antepartum period, first trimester; Dichorionic diamniotic twin pregnancy in first trimester; Chronic hypertension during pregnancy, antepartum; Supervision of high risk pregnancy, antepartum; Grand multiparity; History of asthma; Rubella non-immune status, antepartum; Vitamin D deficiency; Anti-D antibodies present during pregnancy; and Anemia in pregnancy on their problem list.  Patient reports decrease appetite, persistent nausea/emesis.  Contractions: Not present. Vag. Bleeding: None.  Movement: Present. Denies leaking of fluid.   The following portions of the patient's history were reviewed and updated as appropriate: allergies, current medications, past family history, past medical history, past social history, past surgical history and problem list. Problem list updated.  Objective:   Vitals:   01/31/18 1001  BP: 131/90  Pulse: 76  Weight: 196 lb (88.9 kg)    Fetal Status: Fetal Heart Rate (bpm): 158/142   Movement: Present     General:  Alert, oriented and cooperative. Patient is in no acute distress.  Skin: Skin is warm and dry. No rash noted.   Cardiovascular: Normal heart rate noted  Respiratory: Normal respiratory effort, no problems with respiration noted  Abdomen: Soft, gravid, appropriate for gestational age.  Pain/Pressure: Absent     Pelvic: Cervical exam deferred        Extremities: Normal range of motion.  Edema: None  Mental Status: Normal mood and affect. Normal behavior. Normal judgment and thought content.   Assessment and Plan:  Pregnancy: [id number] at [redacted]w[redacted]d  1. Supervision of high risk pregnancy, antepartum Patient is doing well. Reports improvement in her nausea and emesis Appetite is slowly  returning AFP today  2. Rubella non-immune status, antepartum Will offer pp  3. Chronic hypertension during pregnancy, antepartum Continue labetalol 200 BID and ASA  4. Dichorionic diamniotic twin pregnancy in first trimester Follow up anatomy ultrasound on 5/24  5. Rh negative state in antepartum period, first trimester   Preterm labor symptoms and general obstetric precautions including but not limited to vaginal bleeding, contractions, leaking of fluid and fetal movement were reviewed in detail with the patient. Please refer to After Visit Summary for other counseling recommendations.  Return in about 1 month (around 02/28/2018) for ROB.  Future Appointments  Date Time Provider Department Center  01/31/2018 10:30 AM Analiz Tvedt, Gigi Gin, MD [facility] None  02/25/2018  9:30 AM [facility] [place] 5 [facility]    Catalina Antigua, MD

## 2018-01-31 NOTE — Progress Notes (Signed)
C/o not having any appetite, NV x 2 weeks

## 2018-02-02 LAB — AFP, SERUM, OPEN SPINA BIFIDA
AFP MOM: 4.89
AFP VALUE AFPOSL: 127.4 ng/mL
Gest. Age on Collection Date: 15.3 weeks
Maternal Age At EDD: 33.2 yr
OSBR Risk 1 IN: 155
Test Results:: NEGATIVE
Weight: 196 [lb_av]

## 2018-02-09 ENCOUNTER — Other Ambulatory Visit: Payer: Self-pay | Admitting: Certified Nurse Midwife

## 2018-02-09 DIAGNOSIS — B3731 Acute candidiasis of vulva and vagina: Secondary | ICD-10-CM

## 2018-02-09 DIAGNOSIS — B373 Candidiasis of vulva and vagina: Secondary | ICD-10-CM

## 2018-02-25 ENCOUNTER — Encounter (HOSPITAL_COMMUNITY): Payer: Self-pay

## 2018-02-25 ENCOUNTER — Ambulatory Visit (HOSPITAL_COMMUNITY)
Admission: RE | Admit: 2018-02-25 | Discharge: 2018-02-25 | Disposition: A | Discharge status: Home / Self Care | Payer: Medicaid Other | Source: Ambulatory Visit | Attending: Certified Nurse Midwife | Admitting: Certified Nurse Midwife

## 2018-02-25 DIAGNOSIS — Z363 Encounter for antenatal screening for malformations: Secondary | ICD-10-CM | POA: Insufficient documentation

## 2018-02-25 DIAGNOSIS — O0942 Supervision of pregnancy with grand multiparity, second trimester: Secondary | ICD-10-CM | POA: Insufficient documentation

## 2018-02-25 DIAGNOSIS — Z3A19 19 weeks gestation of pregnancy: Secondary | ICD-10-CM | POA: Diagnosis not present

## 2018-02-25 DIAGNOSIS — O30042 Twin pregnancy, dichorionic/diamniotic, second trimester: Secondary | ICD-10-CM | POA: Diagnosis present

## 2018-02-25 DIAGNOSIS — O360120 Maternal care for anti-D [Rh] antibodies, second trimester, not applicable or unspecified: Secondary | ICD-10-CM | POA: Insufficient documentation

## 2018-02-25 DIAGNOSIS — O10919 Unspecified pre-existing hypertension complicating pregnancy, unspecified trimester: Secondary | ICD-10-CM

## 2018-02-25 DIAGNOSIS — O26892 Other specified pregnancy related conditions, second trimester: Secondary | ICD-10-CM | POA: Diagnosis not present

## 2018-02-25 DIAGNOSIS — O26891 Other specified pregnancy related conditions, first trimester: Secondary | ICD-10-CM

## 2018-02-25 DIAGNOSIS — O30041 Twin pregnancy, dichorionic/diamniotic, first trimester: Secondary | ICD-10-CM

## 2018-02-25 DIAGNOSIS — Z6791 Unspecified blood type, Rh negative: Secondary | ICD-10-CM | POA: Insufficient documentation

## 2018-02-25 DIAGNOSIS — O10012 Pre-existing essential hypertension complicating pregnancy, second trimester: Secondary | ICD-10-CM | POA: Insufficient documentation

## 2018-02-25 DIAGNOSIS — O99212 Obesity complicating pregnancy, second trimester: Secondary | ICD-10-CM | POA: Diagnosis not present

## 2018-02-25 DIAGNOSIS — O0990 Supervision of high risk pregnancy, unspecified, unspecified trimester: Secondary | ICD-10-CM

## 2018-03-01 ENCOUNTER — Encounter: Payer: Medicaid Other | Admitting: Obstetrics and Gynecology

## 2018-03-01 ENCOUNTER — Other Ambulatory Visit: Payer: Self-pay | Admitting: Certified Nurse Midwife

## 2018-03-01 DIAGNOSIS — O0990 Supervision of high risk pregnancy, unspecified, unspecified trimester: Secondary | ICD-10-CM

## 2018-03-01 DIAGNOSIS — O30041 Twin pregnancy, dichorionic/diamniotic, first trimester: Secondary | ICD-10-CM

## 2018-03-02 ENCOUNTER — Telehealth: Payer: Self-pay | Admitting: *Deleted

## 2018-03-02 NOTE — Telephone Encounter (Signed)
Left vmail for patient to call back and reschedule missed appt on 03/01/2018.Marland Kitchen

## 2018-03-22 ENCOUNTER — Ambulatory Visit (INDEPENDENT_AMBULATORY_CARE_PROVIDER_SITE_OTHER): Payer: Medicaid Other | Admitting: Obstetrics & Gynecology

## 2018-03-22 ENCOUNTER — Other Ambulatory Visit (HOSPITAL_COMMUNITY)
Admission: RE | Admit: 2018-03-22 | Discharge: 2018-03-22 | Disposition: A | Discharge status: Home / Self Care | Payer: Medicaid Other | Source: Ambulatory Visit | Attending: Obstetrics and Gynecology | Admitting: Obstetrics and Gynecology

## 2018-03-22 ENCOUNTER — Other Ambulatory Visit: Payer: Self-pay

## 2018-03-22 VITALS — BP 134/80 | HR 113 | Wt 209.7 lb

## 2018-03-22 DIAGNOSIS — N898 Other specified noninflammatory disorders of vagina: Secondary | ICD-10-CM | POA: Insufficient documentation

## 2018-03-22 DIAGNOSIS — O30042 Twin pregnancy, dichorionic/diamniotic, second trimester: Secondary | ICD-10-CM

## 2018-03-22 DIAGNOSIS — O30049 Twin pregnancy, dichorionic/diamniotic, unspecified trimester: Secondary | ICD-10-CM

## 2018-03-22 NOTE — Progress Notes (Signed)
ROB, c/o white discharge; denies odor or pain.

## 2018-03-22 NOTE — Progress Notes (Signed)
   PRENATAL VISIT NOTE  Subjective:  Emma Page is a 32 y.o. [id number] at [redacted]w[redacted]d being seen today for ongoing prenatal care.  She is currently monitored for the following issues for this high-risk pregnancy and has Missed abortion; Rh negative state in antepartum period, first trimester; Dichorionic diamniotic twin pregnancy, antepartum; Chronic hypertension during pregnancy, antepartum; Supervision of high risk pregnancy, antepartum; Grand multiparity; History of asthma; Rubella non-immune status, antepartum; Vitamin D deficiency; Anti-D antibodies present during pregnancy; and Anemia in pregnancy on their problem list.  Patient reports vaginal irritation and white discharge for one day.  Contractions: Not present. Vag. Bleeding: None.  Movement: Present. Denies leaking of fluid.   The following portions of the patient's history were reviewed and updated as appropriate: allergies, current medications, past family history, past medical history, past social history, past surgical history and problem list. Problem list updated.  Objective:   Vitals:   03/22/18 1513  BP: 134/80  Pulse: (!) 113  Weight: 209 lb 11.2 oz (95.1 kg)    Fetal Status: Fetal Heart Rate (bpm): 151/146   Movement: Present     General:  Alert, oriented and cooperative. Patient is in no acute distress.  Skin: Skin is warm and dry. No rash noted.   Cardiovascular: Normal heart rate noted  Respiratory: Normal respiratory effort, no problems with respiration noted  Abdomen: Soft, gravid, appropriate for gestational age.  Pain/Pressure: Absent     Pelvic: Cervical exam deferred        Extremities: Normal range of motion.  Edema: Trace  Mental Status: Normal mood and affect. Normal behavior. Normal judgment and thought content.   Assessment and Plan:  Pregnancy: [id number] at 696w4d  1. Vaginal discharge White discharge on speculum exam - Cervicovaginal ancillary only  2. Dichorionic diamniotic twin pregnancy,  antepartum F/u [place] next week  Preterm labor symptoms and general obstetric precautions including but not limited to vaginal bleeding, contractions, leaking of fluid and fetal movement were reviewed in detail with the patient. Please refer to After Visit Summary for other counseling recommendations.  Return in about 1 month (around 04/19/2018).  Future Appointments  Date Time Provider Department Center  04/19/2018 10:00 AM [name], Michael L, MD [facility] None    Scheryl DarterJames Thos Matsumoto, MD

## 2018-03-23 LAB — CERVICOVAGINAL ANCILLARY ONLY
Bacterial vaginitis: NEGATIVE
CANDIDA VAGINITIS: NEGATIVE
CHLAMYDIA, DNA PROBE: NEGATIVE
NEISSERIA GONORRHEA: NEGATIVE
TRICH (WINDOWPATH): NEGATIVE

## 2018-04-01 ENCOUNTER — Inpatient Hospital Stay (HOSPITAL_COMMUNITY)
Admission: AD | Admit: 2018-04-01 | Discharge: 2018-04-02 | DRG: 832 | Discharge status: Against Medical Advice | Payer: Medicaid Other | Attending: Obstetrics and Gynecology | Admitting: Obstetrics and Gynecology

## 2018-04-01 ENCOUNTER — Encounter (HOSPITAL_COMMUNITY): Payer: Self-pay

## 2018-04-01 ENCOUNTER — Other Ambulatory Visit: Payer: Self-pay

## 2018-04-01 ENCOUNTER — Encounter (HOSPITAL_COMMUNITY): Payer: Self-pay | Admitting: *Deleted

## 2018-04-01 ENCOUNTER — Ambulatory Visit (HOSPITAL_COMMUNITY)
Admission: RE | Admit: 2018-04-01 | Discharge: 2018-04-01 | Disposition: A | Discharge status: Home / Self Care | Payer: Medicaid Other | Source: Ambulatory Visit | Attending: Certified Nurse Midwife | Admitting: Certified Nurse Midwife

## 2018-04-01 ENCOUNTER — Other Ambulatory Visit: Payer: Self-pay | Admitting: Certified Nurse Midwife

## 2018-04-01 DIAGNOSIS — O30041 Twin pregnancy, dichorionic/diamniotic, first trimester: Secondary | ICD-10-CM

## 2018-04-01 DIAGNOSIS — O321XX1 Maternal care for breech presentation, fetus 1: Secondary | ICD-10-CM | POA: Diagnosis present

## 2018-04-01 DIAGNOSIS — O30042 Twin pregnancy, dichorionic/diamniotic, second trimester: Secondary | ICD-10-CM | POA: Diagnosis present

## 2018-04-01 DIAGNOSIS — O0942 Supervision of pregnancy with grand multiparity, second trimester: Secondary | ICD-10-CM

## 2018-04-01 DIAGNOSIS — O283 Abnormal ultrasonic finding on antenatal screening of mother: Secondary | ICD-10-CM

## 2018-04-01 DIAGNOSIS — Z3A24 24 weeks gestation of pregnancy: Secondary | ICD-10-CM

## 2018-04-01 DIAGNOSIS — O26891 Other specified pregnancy related conditions, first trimester: Secondary | ICD-10-CM

## 2018-04-01 DIAGNOSIS — O10919 Unspecified pre-existing hypertension complicating pregnancy, unspecified trimester: Secondary | ICD-10-CM

## 2018-04-01 DIAGNOSIS — O10013 Pre-existing essential hypertension complicating pregnancy, third trimester: Secondary | ICD-10-CM

## 2018-04-01 DIAGNOSIS — Z362 Encounter for other antenatal screening follow-up: Secondary | ICD-10-CM

## 2018-04-01 DIAGNOSIS — O321XX2 Maternal care for breech presentation, fetus 2: Secondary | ICD-10-CM | POA: Diagnosis present

## 2018-04-01 DIAGNOSIS — Z6791 Unspecified blood type, Rh negative: Secondary | ICD-10-CM

## 2018-04-01 DIAGNOSIS — O360120 Maternal care for anti-D [Rh] antibodies, second trimester, not applicable or unspecified: Secondary | ICD-10-CM

## 2018-04-01 DIAGNOSIS — Z3A19 19 weeks gestation of pregnancy: Secondary | ICD-10-CM

## 2018-04-01 DIAGNOSIS — O10012 Pre-existing essential hypertension complicating pregnancy, second trimester: Secondary | ICD-10-CM | POA: Diagnosis present

## 2018-04-01 DIAGNOSIS — O99212 Obesity complicating pregnancy, second trimester: Secondary | ICD-10-CM

## 2018-04-01 DIAGNOSIS — R9389 Abnormal findings on diagnostic imaging of other specified body structures: Secondary | ICD-10-CM

## 2018-04-01 DIAGNOSIS — O0990 Supervision of high risk pregnancy, unspecified, unspecified trimester: Secondary | ICD-10-CM

## 2018-04-01 DIAGNOSIS — O30049 Twin pregnancy, dichorionic/diamniotic, unspecified trimester: Secondary | ICD-10-CM

## 2018-04-01 HISTORY — DX: Abnormal ultrasonic finding on antenatal screening of mother: O28.3

## 2018-04-01 LAB — CBC
HCT: 30 % — ABNORMAL LOW (ref 36.0–46.0)
HEMOGLOBIN: 10.2 g/dL — AB (ref 12.0–15.0)
MCH: 29.7 pg (ref 26.0–34.0)
MCHC: 34 g/dL (ref 30.0–36.0)
MCV: 87.5 fL (ref 78.0–100.0)
Platelets: 408 10*3/uL — ABNORMAL HIGH (ref 150–400)
RBC: 3.43 MIL/uL — ABNORMAL LOW (ref 3.87–5.11)
RDW: 13.6 % (ref 11.5–15.5)
WBC: 10.4 10*3/uL (ref 4.0–10.5)

## 2018-04-01 LAB — COMPREHENSIVE METABOLIC PANEL
ALT: 16 U/L (ref 0–44)
AST: 17 U/L (ref 15–41)
Albumin: 3.2 g/dL — ABNORMAL LOW (ref 3.5–5.0)
Alkaline Phosphatase: 68 U/L (ref 38–126)
Anion gap: 10 (ref 5–15)
BUN: 9 mg/dL (ref 6–20)
CO2: 20 mmol/L — ABNORMAL LOW (ref 22–32)
Calcium: 9 mg/dL (ref 8.9–10.3)
Chloride: 104 mmol/L (ref 98–111)
Creatinine, Ser: 0.67 mg/dL (ref 0.44–1.00)
GFR calc Af Amer: >60 mL/min (ref >60)
GFR calc non Af Amer: >60 mL/min (ref >60)
Glucose, Bld: 83 mg/dL (ref 70–99)
Potassium: 4.4 mmol/L (ref 3.5–5.1)
Sodium: 134 mmol/L — ABNORMAL LOW (ref 135–145)
Total Bilirubin: 0.4 mg/dL (ref 0.3–1.2)
Total Protein: 6.8 g/dL (ref 6.5–8.1)

## 2018-04-01 LAB — PROTEIN / CREATININE RATIO, URINE
CREATININE, URINE: 137 mg/dL
PROTEIN CREATININE RATIO: 0.11 mg/mg{creat} (ref 0.00–0.15)
TOTAL PROTEIN, URINE: 15 mg/dL

## 2018-04-01 MED ORDER — SODIUM CHLORIDE 0.9 % IV SOLN: 250.0000 mL | INTRAVENOUS | Status: DC | PRN

## 2018-04-01 MED ORDER — SODIUM CHLORIDE 0.9% FLUSH
3.0000 mL | Freq: Two times a day (BID) | INTRAVENOUS | Status: DC
  Administered 2018-04-01 – 2018-04-02 (×2): 3 mL via INTRAVENOUS

## 2018-04-01 MED ORDER — PRENATAL MULTIVITAMIN CH
1.0000 | ORAL_TABLET | Freq: Every day | ORAL | Status: DC
  Administered 2018-04-02: 1 via ORAL
  Filled 2018-04-01: qty 1

## 2018-04-01 MED ORDER — LABETALOL HCL 200 MG PO TABS
200.0000 mg | ORAL_TABLET | Freq: Two times a day (BID) | ORAL | Status: DC
  Administered 2018-04-01 – 2018-04-02 (×2): 200 mg via ORAL
  Filled 2018-04-01 (×2): qty 1

## 2018-04-01 MED ORDER — BETAMETHASONE SOD PHOS & ACET 6 (3-3) MG/ML IJ SUSP
12.0000 mg | INTRAMUSCULAR | Status: AC
  Administered 2018-04-01 – 2018-04-02 (×2): 12 mg via INTRAMUSCULAR
  Filled 2018-04-01 (×2): qty 2

## 2018-04-01 MED ORDER — ACETAMINOPHEN 325 MG PO TABS
650.0000 mg | ORAL_TABLET | ORAL | Status: DC | PRN
  Administered 2018-04-02: 650 mg via ORAL
  Filled 2018-04-01: qty 2

## 2018-04-01 MED ORDER — DOCUSATE SODIUM 100 MG PO CAPS
100.0000 mg | ORAL_CAPSULE | Freq: Every day | ORAL | Status: DC
  Administered 2018-04-01: 100 mg via ORAL
  Filled 2018-04-01 (×2): qty 1

## 2018-04-01 MED ORDER — SODIUM CHLORIDE 0.9% FLUSH: 3.0000 mL | INTRAVENOUS | Status: DC | PRN

## 2018-04-01 MED ORDER — CALCIUM CARBONATE ANTACID 500 MG PO CHEW: 2.0000 | CHEWABLE_TABLET | ORAL | Status: DC | PRN

## 2018-04-01 MED ORDER — ASPIRIN 81 MG PO CHEW
81.0000 mg | CHEWABLE_TABLET | Freq: Every day | ORAL | Status: DC
  Administered 2018-04-01 – 2018-04-02 (×2): 81 mg via ORAL
  Filled 2018-04-01 (×3): qty 1

## 2018-04-01 NOTE — Progress Notes (Signed)
RN at bedside attempting to adjust fetal monitor, informing pt that security called to inform pt that the patient had a visitor but that she had the max allowed visitors at this time. Patient becoming loud and threatening to leave the hospital. RN encouraging pt to stay to further evaluate her pregnancy. Family upset and complaining but eventually compliant.

## 2018-04-01 NOTE — Progress Notes (Signed)
Neonatologist notified about consult

## 2018-04-01 NOTE — Consult Note (Signed)
Neonatology Consult Note:  At the request of the patients obstetrician Dr. Elly Modena I met with Emma [name] who is a 32 y.o. female G17 P3-0-12-3 at 53w0dwith di-di twins presenting for antepartum monitoring of pregnancy with abnormal dopplers. Patient with prenatal care at [facility] complicated by [facility] Di-Di twin pregnancy, Rh neg, anti- D (low titers). Patient seen for routine scan and noted to have abnormal dopplers in twin B.  Plan to admit for observation with betamethasone administration.  We discussed morbidity/mortality at this gestional age, delivery room resuscitation, including intubation and surfactant in DR.  Discussed mechanical ventilation and risk for chronic lung disease, risk for IVH with potential for motor / cognitive deficits, ROP, NEC, sepsis, as well as temperature instability and feeding immaturity.  Discussed NG / OG feeds, benefits of MBM in reducing incidence of NEC.   Discussed likely length of stay.  Thank you for allowing [place] participate in her care.  Please call with questions.  BHiginio Roger DO  Neonatologist  The total length of face-to-face or floor / unit time for this encounter was 30 minutes.  Counseling and / or coordination of care was greater than fifty percent of the time.

## 2018-04-01 NOTE — H&P (Signed)
Emma [name] is a 32 y.o. female G17 P3-0-12-3 at 5034w0d with di-di twins presenting for antepartum monitoring of pregnancy with abnormal dopplers. Patient with prenatal [facility] at [facility]Di [facility], Rh neg, anti- D (low titers). Patient seen for routine scan and noted to habe abnormal dopplers in twin B. Patient reports feeling well. She denies any contractions, vaginal bleeding or leakage of fluid. She reports good fetal movement x 2   OB History    Gravida  17   Para  3   Term  3   Preterm  0   AB  12   Living  3     SAB  7   TAB  5   Ectopic  0   Multiple  0   Live Births  3          Past Medical History:  Diagnosis Date  . Asthma   . Chlamydia   . Gonorrhea   . Hypertension   . Infection    UTI  . Pregnancy induced hypertension   . Trichomonas infection   . Vaginal Pap smear, abnormal    bx x3   Past Surgical History:  Procedure Laterality Date  . abortion    . DILATION AND CURETTAGE OF UTERUS    . DILATION AND EVACUATION [name]/A 08/19/2017   Procedure: DILATATION AND EVACUATION;  Surgeon: Willodean RosenthalHarraway-Smith, Carolyn, MD;  Location: [facility];  Service: Gynecology;  Laterality: [name]/A;  . EYE SURGERY Right    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   Family History: family history includes Cancer in her maternal grandmother and paternal grandmother; Diabetes in her paternal grandfather; Heart disease in her maternal grandmother; Hypertension in her father. Social History:  reports that she has never smoked. She has never used smokeless tobacco. She reports that she does not drink alcohol or use drugs.     Maternal Diabetes: No Genetic Screening: Normal Maternal Ultrasounds/Referrals: Normal Fetal Ultrasounds or other Referrals:  None Maternal Substance Abuse:  No Significant Maternal Medications:  None Significant Maternal Lab Results:  None Other Comments:  None  ROS  See pertinent History   Blood pressure (!) 142/94, pulse  80, resp. rate 20, height 5\' 5"  (1.651 m), weight 208 lb (94.3 kg), last menstrual period 10/15/2017, unknown if currently breastfeeding. Exam Physical Exam  GENERAL: Well-developed, well-nourished female in no acute distress.  LUNGS: Clear to auscultation bilaterally.  HEART: Regular rate and rhythm. ABDOMEN: Soft, nontender, gravid PELVIC: Not indicated EXTREMITIES: No cyanosis, clubbing, or edema, 2+ distal pulses.  Prenatal labs: ABO, Rh: A/Negative/-- (04/01 1111) Antibody: Positive, See Final Results (04/01 1111) Rubella: 0.99 (04/01 1111) RPR: Non Reactive (04/01 1111)  HBsAg: Negative (04/01 1111)  HIV: Non Reactive (04/01 1111)  GBS:     [place] Mfm Ob Follow Up  Result Date: 04/01/2018 ----------------------------------------------------------------------  OBSTETRICS REPORT                      (Signed Final 04/01/2018 04:07 pm) ---------------------------------------------------------------------- Patient Info  ID #:       [id number]                          D.O.B.:  April 24, 1986 (31 yrs)  Name:       Emma [name]                      Visit Date: 04/01/2018 02:21 pm  [place] ---------------------------------------------------------------------- Performed By  Performed By:     Lenise Arena        Ref. Address:     [address]                                                             [address]                                                             [place]                                                             [address]  Attending:        Darlyn Read MD         Location:         [facility]  Referred By:      Center for                    [facility]                    [facility] ---------------------------------------------------------------------- Orders   #  Description                                 Code   1  [place] MFM OB FOLLOW UP                         [id number]    2  [place] MFM OB FOLLOW UP ADDL GEST               60454.09   3  [place] MFM UA CORD DOPPLER                      76820.02   4  [place] MFM UA DOPPLER ADDL GEST RE              76820.03      EVAL  ----------------------------------------------------------------------   #  Ordered By               Order #        Accession #    Episode #   1  RACHELLE DENNEY          [id number]  [id number]     [id number]   2  RACHELLE DENNEY          [id number]      [id number]     [id number]   3  RACHELLE DENNEY          [id number]      [id number]     [id number]   4  RACHELLE DENNEY          [id number]      [id number]     [id number]  ---------------------------------------------------------------------- Indications   [redacted] weeks gestation of pregnancy                Z3A.24   Twin pregnancy, di/di, second trimester        O30.042   Hypertension - Chronic/Pre-existing            O10.019   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,      O36.0120   second trimester, (Too weak to titer on 4-1-   19)   Encounter for antenatal screening for          Z36.3   malformations   Obesity complicating pregnancy, second         O99.212   trimester   Grand multiparity, antepartum                  O09.40  ---------------------------------------------------------------------- OB History  Blood Type:            Height:  5'3"   Weight (lb):  185       BMI:  32.77  Gravidity:    17        Term:   3        Prem:   0        SAB:   7  TOP:          5       Ectopic:  0        Living: 3 ---------------------------------------------------------------------- Fetal Evaluation (Fetus A)  Num Of Fetuses:     2  Fetal Heart         151  Rate(bpm):  Cardiac Activity:   Observed  Fetal Lie:          Maternal left side  Presentation:       Breech  Placenta:           Posterior, above cervical os  P. Cord Insertion:  Visualized, central  Membrane Desc:      Dividing Membrane seen - Dichorionic.  Amniotic Fluid  AFI FV:      Subjectively within normal limits                               Largest Pocket(cm)                              3.3 ---------------------------------------------------------------------- Biometry (Fetus A)  BPD:      60.4  mm     G. Age:  24w 4d         66  %    CI:        77.81   %    70 - 86  FL/HC:      20.2   %    18.7 - 20.9  HC:      216.7  mm     G. Age:  23w 5d         23  %    HC/AC:      1.14        1.05 - 1.21  AC:      190.4  mm     G. Age:  23w 5d         35  %    FL/BPD:     72.5   %    71 - 87  FL:       43.8  mm     G. Age:  24w 3d         50  %    FL/AC:      23.0   %    20 - 24  HUM:      42.5  mm     G. Age:  25w 4d         78  %  Est. FW:     655  gm      1 lb 7 oz     53  %     FW Discordancy     0 \ 19 % ---------------------------------------------------------------------- Gestational Age (Fetus A)  LMP:           24w 0d        Date:  10/15/17                 EDD:   07/22/18  U/S Today:     24w 1d                                        EDD:   07/21/18  Best:          24w 0d     Det. By:  LMP  (10/15/17)          EDD:   07/22/18 ---------------------------------------------------------------------- Anatomy (Fetus A)  Cranium:               Appears normal         Aortic Arch:            Previously seen  Cavum:                 Appears normal         Ductal Arch:            Appears normal  Ventricles:            Appears normal         Diaphragm:              Appears normal  Choroid Plexus:        Previously seen        Stomach:                Appears normal, left  sided  Cerebellum:            Previously seen        Abdomen:                Appears normal  Posterior Fossa:       Previously seen        Abdominal Wall:         Previously seen  Nuchal Fold:           Previously seen        Cord Vessels:           Previously seen  Face:                  Profile nl; orbits     Kidneys:                Appear normal                          previously seen  Lips:                  Previously seen        Bladder:                Appears normal  Thoracic:              Appears normal         Spine:                  Previously seen  Heart:                 Appears normal         Upper Extremities:      Appears normal                         (4CH, axis, and situs  RVOT:                  Previously seen        Lower Extremities:      Previously seen  LVOT:                  Previously seen  Other:  Fetus appears to be a female. Lt previously heel visualized. ---------------------------------------------------------------------- Doppler - Fetal Vessels (Fetus A)  Umbilical Artery   S/D     %tile                                     PSV                                                   (cm/s)  4.22       76                                     35.28 ---------------------------------------------------------------------- Fetal Evaluation (Fetus B)  Num Of Fetuses:     2  Fetal Heart         154  Rate(bpm):  Cardiac Activity:   Observed  Fetal Lie:  Maternal right side  Presentation:       Breech  Placenta:           Posterior, above cervical os  P. Cord Insertion:  Visualized, central  Membrane Desc:      Dividing Membrane seen - Dichorionic.  Amniotic Fluid  AFI FV:      Subjectively within normal limits                              Largest Pocket(cm)                              3.68 ---------------------------------------------------------------------- Biometry (Fetus B)  BPD:      52.4  mm     G. Age:  21w 6d          1  %    CI:         68.2   %    70 - 86                                                          FL/HC:      19.7   %    18.7 - 20.9  HC:      202.9  mm     G. Age:  22w 3d        < 3  %    HC/AC:      1.13        1.05 - 1.21  AC:      179.1  mm     G. Age:  22w 6d         11  %    FL/BPD:     76.1   %    71 - 87  FL:       39.9  mm     G. Age:  22w 6d         10  %    FL/AC:      22.3   %    20 - 24  HUM:      38.3  mm     G. Age:  23w 4d          31  %  Est. FW:     530  gm      1 lb 3 oz     25  %     FW Discordancy        19  % ---------------------------------------------------------------------- Gestational Age (Fetus B)  LMP:           24w 0d        Date:  10/15/17                 EDD:   07/22/18  U/S Today:     22w 4d                                        EDD:   08/01/18  Best:          24w 0d     Det. By:  LMP  (10/15/17)  EDD:   07/22/18 ---------------------------------------------------------------------- Anatomy (Fetus B)  Cranium:               Appears normal         Aortic Arch:            Previously seen  Cavum:                 Appears normal         Ductal Arch:            Previously seen  Ventricles:            Appears normal         Diaphragm:              Appears normal  Choroid Plexus:        Previously seen        Stomach:                Appears normal, left                                                                        sided  Cerebellum:            Previously seen        Abdomen:                Appears normal  Posterior Fossa:       Previously seen        Abdominal Wall:         Previously seen  Nuchal Fold:           Previously seen        Cord Vessels:           Previously seen  Face:                  Orbits and profile     Kidneys:                Previously seen                         previously seen  Lips:                  Previously seen        Bladder:                Appears normal  Thoracic:              Appears normal         Spine:                  Previously seen  Heart:                 Appears normal         Upper Extremities:      Appears normal                         (4CH, axis, and situs  RVOT:                  Previously seen  Lower Extremities:      Previously seen  LVOT:                  Previously seen  Other:  Fetus appears to be a female. Heels previously visualized. Nasal          bone previously visualized. ---------------------------------------------------------------------- Doppler -  Fetal Vessels (Fetus B)  Umbilical Artery                                                            ADFV    RDFV                                                              Yes     Yes ---------------------------------------------------------------------- Cervix Uterus Adnexa  Cervix  Length:           3.45  cm.  Normal appearance by transabdominal scan. ---------------------------------------------------------------------- Impression  Dichorionic-diamniotic twin gestation at 24w 0d.  Twin A:  Maternal left side, breech  presentation.  Placenta posterior, above cervical os.  Appropriate fetal growth.  Normal amniotic fluid volume.  Normal interval fetal anatomy.  Twin B:  Maternal right side, breech presentation.  Placenta posterior, above cervical os.  Composite fetal growth in the 25%ile. AC 11%ile. HC<3%ile,  FL 10%ile.  Normal amniotic fluid volume.  Normal interval fetal anatomy.  UA dopplers with absent end diastolic flow and intermittent  reversal.  Normal DV dopplers. ---------------------------------------------------------------------- Recommendations  Findings of today's ultrasound discussed. Recommend  admission for fetal monitoring, antenatal corticosteroids, and  neonatology consult given abnormal dopplers.  Repeat dopplers on Monday if fetal monitoring reassuring in  the interim.  Findings discussed with primary OB Dr. Jolayne Panther. Patient  escorted to MAU for registration. ----------------------------------------------------------------------                   Emma Read, MD Electronically Signed Final Report   04/01/2018 04:07 pm ----------------------------------------------------------------------  [place] Mfm Ob Follow Up Addl Gest  Result Date: 04/01/2018 ----------------------------------------------------------------------  OBSTETRICS REPORT                      (Signed Final 04/01/2018 04:07 pm) ---------------------------------------------------------------------- Patient Info  ID #:        [id number]                          D.O.B.:  Dec 04, 1985 (31 yrs)  Name:       Emma [name]                      Visit Date: 04/01/2018 02:21 pm              [place] ---------------------------------------------------------------------- Performed By  Performed By:     Lenise Arena        Ref. Address:     [address]                    [facility]  [address]                                                             [address]                                                             [place]                                                             [address]  Attending:        Darlyn Read MD         Location:         [facility]  Referred By:      Center for                    [facility]                    [facility] ---------------------------------------------------------------------- Orders   #  Description                                 Code   1  [place] MFM OB FOLLOW UP                         [id number]   2  [place] MFM OB FOLLOW UP ADDL GEST               76816.02   3  [place] MFM UA CORD DOPPLER                      76820.02   4  [place] MFM UA DOPPLER ADDL GEST RE              76820.03      EVAL  ----------------------------------------------------------------------   #  Ordered By               Order #        Accession #    Episode #   1  RACHELLE DENNEY          [id number]      [id number]     [id number]   2  RACHELLE DENNEY          [id number]      [id number]     [id number]   3  RACHELLE DENNEY          [id number]      [id number]     [id number]   4  RACHELLE DENNEY          [id number]      [id number]     [id number]  ---------------------------------------------------------------------- Indications   [redacted] weeks gestation of pregnancy  Z3A.24   Twin pregnancy, di/di, second trimester        O30.042   Hypertension - Chronic/Pre-existing            O10.019   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,      O36.0120   second trimester, (Too weak to titer on 4-1-    19)   Encounter for antenatal screening for          Z36.3   malformations   Obesity complicating pregnancy, second         O99.212   trimester   Grand multiparity, antepartum                  O09.40  ---------------------------------------------------------------------- OB History  Blood Type:            Height:  5'3"   Weight (lb):  185       BMI:  32.77  Gravidity:    17        Term:   3        Prem:   0        SAB:   7  TOP:          5       Ectopic:  0        Living: 3 ---------------------------------------------------------------------- Fetal Evaluation (Fetus A)  Num Of Fetuses:     2  Fetal Heart         151  Rate(bpm):  Cardiac Activity:   Observed  Fetal Lie:          Maternal left side  Presentation:       Breech  Placenta:           Posterior, above cervical os  P. Cord Insertion:  Visualized, central  Membrane Desc:      Dividing Membrane seen - Dichorionic.  Amniotic Fluid  AFI FV:      Subjectively within normal limits                              Largest Pocket(cm)                              3.3 ---------------------------------------------------------------------- Biometry (Fetus A)  BPD:      60.4  mm     G. Age:  24w 4d         66  %    CI:        77.81   %    70 - 86                                                          FL/HC:      20.2   %    18.7 - 20.9  HC:      216.7  mm     G. Age:  23w 5d         23  %    HC/AC:      1.14        1.05 - 1.21  AC:      190.4  mm     G. Age:  23w 5d  35  %    FL/BPD:     72.5   %    71 - 87  FL:       43.8  mm     G. Age:  24w 3d         50  %    FL/AC:      23.0   %    20 - 24  HUM:      42.5  mm     G. Age:  25w 4d         78  %  Est. FW:     655  gm      1 lb 7 oz     53  %     FW Discordancy     0 \ 19 % ---------------------------------------------------------------------- Gestational Age (Fetus A)  LMP:           24w 0d        Date:  10/15/17                 EDD:   07/22/18  U/S Today:     24w 1d                                        EDD:    07/21/18  Best:          24w 0d     Det. By:  LMP  (10/15/17)          EDD:   07/22/18 ---------------------------------------------------------------------- Anatomy (Fetus A)  Cranium:               Appears normal         Aortic Arch:            Previously seen  Cavum:                 Appears normal         Ductal Arch:            Appears normal  Ventricles:            Appears normal         Diaphragm:              Appears normal  Choroid Plexus:        Previously seen        Stomach:                Appears normal, left                                                                        sided  Cerebellum:            Previously seen        Abdomen:                Appears normal  Posterior Fossa:       Previously seen        Abdominal Wall:         Previously seen  Nuchal Fold:           Previously  seen        Cord Vessels:           Previously seen  Face:                  Profile nl; orbits     Kidneys:                Appear normal                         previously seen  Lips:                  Previously seen        Bladder:                Appears normal  Thoracic:              Appears normal         Spine:                  Previously seen  Heart:                 Appears normal         Upper Extremities:      Appears normal                         (4CH, axis, and situs  RVOT:                  Previously seen        Lower Extremities:      Previously seen  LVOT:                  Previously seen  Other:  Fetus appears to be a female. Lt previously heel visualized. ---------------------------------------------------------------------- Doppler - Fetal Vessels (Fetus A)  Umbilical Artery   S/D     %tile                                     PSV                                                   (cm/s)  4.22       76                                     35.28 ---------------------------------------------------------------------- Fetal Evaluation (Fetus B)  Num Of Fetuses:     2  Fetal Heart         154  Rate(bpm):   Cardiac Activity:   Observed  Fetal Lie:          Maternal right side  Presentation:       Breech  Placenta:           Posterior, above cervical os  P. Cord Insertion:  Visualized, central  Membrane Desc:      Dividing Membrane seen - Dichorionic.  Amniotic Fluid  AFI FV:      Subjectively within normal limits  Largest Pocket(cm)                              3.68 ---------------------------------------------------------------------- Biometry (Fetus B)  BPD:      52.4  mm     G. Age:  21w 6d          1  %    CI:         68.2   %    70 - 86                                                          FL/HC:      19.7   %    18.7 - 20.9  HC:      202.9  mm     G. Age:  22w 3d        < 3  %    HC/AC:      1.13        1.05 - 1.21  AC:      179.1  mm     G. Age:  22w 6d         11  %    FL/BPD:     76.1   %    71 - 87  FL:       39.9  mm     G. Age:  22w 6d         10  %    FL/AC:      22.3   %    20 - 24  HUM:      38.3  mm     G. Age:  23w 4d         31  %  Est. FW:     530  gm      1 lb 3 oz     25  %     FW Discordancy        19  % ---------------------------------------------------------------------- Gestational Age (Fetus B)  LMP:           24w 0d        Date:  10/15/17                 EDD:   07/22/18  U/S Today:     22w 4d                                        EDD:   08/01/18  Best:          24w 0d     Det. By:  LMP  (10/15/17)          EDD:   07/22/18 ---------------------------------------------------------------------- Anatomy (Fetus B)  Cranium:               Appears normal         Aortic Arch:            Previously seen  Cavum:                 Appears normal         Ductal Arch:  Previously seen  Ventricles:            Appears normal         Diaphragm:              Appears normal  Choroid Plexus:        Previously seen        Stomach:                Appears normal, left                                                                        sided  Cerebellum:             Previously seen        Abdomen:                Appears normal  Posterior Fossa:       Previously seen        Abdominal Wall:         Previously seen  Nuchal Fold:           Previously seen        Cord Vessels:           Previously seen  Face:                  Orbits and profile     Kidneys:                Previously seen                         previously seen  Lips:                  Previously seen        Bladder:                Appears normal  Thoracic:              Appears normal         Spine:                  Previously seen  Heart:                 Appears normal         Upper Extremities:      Appears normal                         (4CH, axis, and situs  RVOT:                  Previously seen        Lower Extremities:      Previously seen  LVOT:                  Previously seen  Other:  Fetus appears to be a female. Heels previously visualized. Nasal          bone previously visualized. ---------------------------------------------------------------------- Doppler - Fetal Vessels (Fetus B)  Umbilical Artery  ADFV    RDFV                                                              Yes     Yes ---------------------------------------------------------------------- Cervix Uterus Adnexa  Cervix  Length:           3.45  cm.  Normal appearance by transabdominal scan. ---------------------------------------------------------------------- Impression  Dichorionic-diamniotic twin gestation at 24w 0d.  Twin A:  Maternal left side, breech  presentation.  Placenta posterior, above cervical os.  Appropriate fetal growth.  Normal amniotic fluid volume.  Normal interval fetal anatomy.  Twin B:  Maternal right side, breech presentation.  Placenta posterior, above cervical os.  Composite fetal growth in the 25%ile. AC 11%ile. HC<3%ile,  FL 10%ile.  Normal amniotic fluid volume.  Normal interval fetal anatomy.  UA dopplers with absent end diastolic flow and intermittent   reversal.  Normal DV dopplers. ---------------------------------------------------------------------- Recommendations  Findings of today's ultrasound discussed. Recommend  admission for fetal monitoring, antenatal corticosteroids, and  neonatology consult given abnormal dopplers.  Repeat dopplers on Monday if fetal monitoring reassuring in  the interim.  Findings discussed with primary OB Dr. Jolayne Panther. Patient  escorted to MAU for registration. ----------------------------------------------------------------------                   Emma Read, MD Electronically Signed Final Report   04/01/2018 04:07 pm ----------------------------------------------------------------------  [place] Mfm Ua Cord Doppler  Result Date: 04/01/2018 ----------------------------------------------------------------------  OBSTETRICS REPORT                      (Signed Final 04/01/2018 04:07 pm) ---------------------------------------------------------------------- Patient Info  ID #:       [id number]                          D.O.B.:  11/22/1985 (31 yrs)  Name:       Emma [name]                      Visit Date: 04/01/2018 02:21 pm              [place] ---------------------------------------------------------------------- Performed By  Performed By:     Lenise Arena        Ref. Address:     [address]                                                             [phone]  [place]                                                             [address]  Attending:        Darlyn Read MD         Location:         [facility]  Referred By:      Center for                    [facility]                    [facility] - [facility] ---------------------------------------------------------------------- Orders   #  Description                                 Code   1  [place] MFM OB FOLLOW UP                          [id number]   2  [place] MFM OB FOLLOW UP ADDL GEST               76816.02   3  [place] MFM UA CORD DOPPLER                      76820.02   4  [place] MFM UA DOPPLER ADDL GEST RE              76820.03      EVAL  ----------------------------------------------------------------------   #  Ordered By               Order #        Accession #    Episode #   1  RACHELLE DENNEY          [id number]      [id number]     [id number]   2  RACHELLE DENNEY          [id number]      [id number]     [id number]   3  RACHELLE DENNEY          [id number]      [id number]     [id number]   4  RACHELLE DENNEY          [id number]      [id number]     [id number]  ---------------------------------------------------------------------- Indications   [redacted] weeks gestation of pregnancy                Z3A.24   Twin pregnancy, di/di, second trimester        O30.042   Hypertension - Chronic/Pre-existing            O10.019   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,      O36.0120   second trimester, (Too weak to titer on 4-1-   19)   Encounter for antenatal screening for          Z36.3   malformations   Obesity complicating pregnancy, second         O99.212   trimester   Grand multiparity, antepartum  O09.40  ---------------------------------------------------------------------- OB History  Blood Type:            Height:  5'3"   Weight (lb):  185       BMI:  32.77  Gravidity:    17        Term:   3        Prem:   0        SAB:   7  TOP:          5       Ectopic:  0        Living: 3 ---------------------------------------------------------------------- Fetal Evaluation (Fetus A)  Num Of Fetuses:     2  Fetal Heart         151  Rate(bpm):  Cardiac Activity:   Observed  Fetal Lie:          Maternal left side  Presentation:       Breech  Placenta:           Posterior, above cervical os  P. Cord Insertion:  Visualized, central  Membrane Desc:      Dividing Membrane seen - Dichorionic.  Amniotic Fluid  AFI FV:      Subjectively within normal limits                               Largest Pocket(cm)                              3.3 ---------------------------------------------------------------------- Biometry (Fetus A)  BPD:      60.4  mm     G. Age:  24w 4d         66  %    CI:        77.81   %    70 - 86                                                          FL/HC:      20.2   %    18.7 - 20.9  HC:      216.7  mm     G. Age:  23w 5d         23  %    HC/AC:      1.14        1.05 - 1.21  AC:      190.4  mm     G. Age:  23w 5d         35  %    FL/BPD:     72.5   %    71 - 87  FL:       43.8  mm     G. Age:  24w 3d         50  %    FL/AC:      23.0   %    20 - 24  HUM:      42.5  mm     G. Age:  25w 4d         78  %  Est. FW:     655  gm      1 lb  7 oz     53  %     FW Discordancy     0 \ 19 % ---------------------------------------------------------------------- Gestational Age (Fetus A)  LMP:           24w 0d        Date:  10/15/17                 EDD:   07/22/18  U/S Today:     24w 1d                                        EDD:   07/21/18  Best:          24w 0d     Det. By:  LMP  (10/15/17)          EDD:   07/22/18 ---------------------------------------------------------------------- Anatomy (Fetus A)  Cranium:               Appears normal         Aortic Arch:            Previously seen  Cavum:                 Appears normal         Ductal Arch:            Appears normal  Ventricles:            Appears normal         Diaphragm:              Appears normal  Choroid Plexus:        Previously seen        Stomach:                Appears normal, left                                                                        sided  Cerebellum:            Previously seen        Abdomen:                Appears normal  Posterior Fossa:       Previously seen        Abdominal Wall:         Previously seen  Nuchal Fold:           Previously seen        Cord Vessels:           Previously seen  Face:                  Profile nl; orbits     Kidneys:                Appear normal                          previously seen  Lips:                  Previously seen  Bladder:                Appears normal  Thoracic:              Appears normal         Spine:                  Previously seen  Heart:                 Appears normal         Upper Extremities:      Appears normal                         (4CH, axis, and situs  RVOT:                  Previously seen        Lower Extremities:      Previously seen  LVOT:                  Previously seen  Other:  Fetus appears to be a female. Lt previously heel visualized. ---------------------------------------------------------------------- Doppler - Fetal Vessels (Fetus A)  Umbilical Artery   S/D     %tile                                     PSV                                                   (cm/s)  4.22       76                                     35.28 ---------------------------------------------------------------------- Fetal Evaluation (Fetus B)  Num Of Fetuses:     2  Fetal Heart         154  Rate(bpm):  Cardiac Activity:   Observed  Fetal Lie:          Maternal right side  Presentation:       Breech  Placenta:           Posterior, above cervical os  P. Cord Insertion:  Visualized, central  Membrane Desc:      Dividing Membrane seen - Dichorionic.  Amniotic Fluid  AFI FV:      Subjectively within normal limits                              Largest Pocket(cm)                              3.68 ---------------------------------------------------------------------- Biometry (Fetus B)  BPD:      52.4  mm     G. Age:  21w 6d          1  %    CI:         68.2   %    70 - 86  FL/HC:      19.7   %    18.7 - 20.9  HC:      202.9  mm     G. Age:  22w 3d        < 3  %    HC/AC:      1.13        1.05 - 1.21  AC:      179.1  mm     G. Age:  22w 6d         11  %    FL/BPD:     76.1   %    71 - 87  FL:       39.9  mm     G. Age:  22w 6d         10  %    FL/AC:      22.3   %    20 - 24  HUM:      38.3  mm     G. Age:  23w 4d          31  %  Est. FW:     530  gm      1 lb 3 oz     25  %     FW Discordancy        19  % ---------------------------------------------------------------------- Gestational Age (Fetus B)  LMP:           24w 0d        Date:  10/15/17                 EDD:   07/22/18  U/S Today:     22w 4d                                        EDD:   08/01/18  Best:          24w 0d     Det. By:  LMP  (10/15/17)          EDD:   07/22/18 ---------------------------------------------------------------------- Anatomy (Fetus B)  Cranium:               Appears normal         Aortic Arch:            Previously seen  Cavum:                 Appears normal         Ductal Arch:            Previously seen  Ventricles:            Appears normal         Diaphragm:              Appears normal  Choroid Plexus:        Previously seen        Stomach:                Appears normal, left  sided  Cerebellum:            Previously seen        Abdomen:                Appears normal  Posterior Fossa:       Previously seen        Abdominal Wall:         Previously seen  Nuchal Fold:           Previously seen        Cord Vessels:           Previously seen  Face:                  Orbits and profile     Kidneys:                Previously seen                         previously seen  Lips:                  Previously seen        Bladder:                Appears normal  Thoracic:              Appears normal         Spine:                  Previously seen  Heart:                 Appears normal         Upper Extremities:      Appears normal                         (4CH, axis, and situs  RVOT:                  Previously seen        Lower Extremities:      Previously seen  LVOT:                  Previously seen  Other:  Fetus appears to be a female. Heels previously visualized. Nasal          bone previously visualized. ----------------------------------------------------------------------  Doppler - Fetal Vessels (Fetus B)  Umbilical Artery                                                            ADFV    RDFV                                                              Yes     Yes ---------------------------------------------------------------------- Cervix Uterus Adnexa  Cervix  Length:           3.45  cm.  Normal appearance by transabdominal scan. ---------------------------------------------------------------------- Impression  Dichorionic-diamniotic twin gestation at 24w 0d.  Twin A:  Maternal left  side, breech  presentation.  Placenta posterior, above cervical os.  Appropriate fetal growth.  Normal amniotic fluid volume.  Normal interval fetal anatomy.  Twin B:  Maternal right side, breech presentation.  Placenta posterior, above cervical os.  Composite fetal growth in the 25%ile. AC 11%ile. HC<3%ile,  FL 10%ile.  Normal amniotic fluid volume.  Normal interval fetal anatomy.  UA dopplers with absent end diastolic flow and intermittent  reversal.  Normal DV dopplers. ---------------------------------------------------------------------- Recommendations  Findings of today's ultrasound discussed. Recommend  admission for fetal monitoring, antenatal corticosteroids, and  neonatology consult given abnormal dopplers.  Repeat dopplers on Monday if fetal monitoring reassuring in  the interim.  Findings discussed with primary OB Dr. Jolayne Panther. Patient  escorted to MAU for registration. ----------------------------------------------------------------------                   Emma Read, MD Electronically Signed Final Report   04/01/2018 04:07 pm ----------------------------------------------------------------------  [place] Mfm Ua Doppler Addl Gest Re Eval  Result Date: 04/01/2018 ----------------------------------------------------------------------  OBSTETRICS REPORT                      (Signed Final 04/01/2018 04:07 pm) ---------------------------------------------------------------------- Patient Info   ID #:       [id number]                          D.O.B.:  Oct 11, 1985 (31 yrs)  Name:       Emma [name]                      Visit Date: 04/01/2018 02:21 pm              [place] ---------------------------------------------------------------------- Performed By  Performed By:     Lenise Arena        Ref. Address:     [address]                                                             [address]                                                             [place]                                                             [address]  Attending:  Darlyn Read MD         Location:         [facility]  Referred By:      Center for                    [facility]                    [facility] - [facility] ---------------------------------------------------------------------- Orders   #  Description                                 Code   1  [place] MFM OB FOLLOW UP                         [id number]   2  [place] MFM OB FOLLOW UP ADDL GEST               76816.02   3  [place] MFM UA CORD DOPPLER                      76820.02   4  [place] MFM UA DOPPLER ADDL GEST RE              76820.03      EVAL  ----------------------------------------------------------------------   #  Ordered By               Order #        Accession #    Episode #   1  RACHELLE DENNEY          [id number]      [id number]     [id number]   2  RACHELLE DENNEY          [id number]      [id number]     [id number]   3  RACHELLE DENNEY          [id number]      [id number]     [id number]   4  RACHELLE DENNEY          [id number]      [id number]     [id number]  ---------------------------------------------------------------------- Indications   [redacted] weeks gestation of pregnancy                Z3A.24   Twin pregnancy, di/di, second trimester        O30.042   Hypertension - Chronic/Pre-existing            O10.019   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,      O36.0120   second trimester, (Too weak to  titer on 4-1-   19)   Encounter for antenatal screening for          Z36.3   malformations   Obesity complicating pregnancy, second         O99.212   trimester   Grand multiparity, antepartum                  O09.40  ---------------------------------------------------------------------- OB History  Blood Type:            Height:  5'3"   Weight (lb):  185       BMI:  32.77  Gravidity:    17        Term:   3        Prem:   0  SAB:   7  TOP:          5       Ectopic:  0        Living: 3 ---------------------------------------------------------------------- Fetal Evaluation (Fetus A)  Num Of Fetuses:     2  Fetal Heart         151  Rate(bpm):  Cardiac Activity:   Observed  Fetal Lie:          Maternal left side  Presentation:       Breech  Placenta:           Posterior, above cervical os  P. Cord Insertion:  Visualized, central  Membrane Desc:      Dividing Membrane seen - Dichorionic.  Amniotic Fluid  AFI FV:      Subjectively within normal limits                              Largest Pocket(cm)                              3.3 ---------------------------------------------------------------------- Biometry (Fetus A)  BPD:      60.4  mm     G. Age:  24w 4d         66  %    CI:        77.81   %    70 - 86                                                          FL/HC:      20.2   %    18.7 - 20.9  HC:      216.7  mm     G. Age:  23w 5d         23  %    HC/AC:      1.14        1.05 - 1.21  AC:      190.4  mm     G. Age:  23w 5d         35  %    FL/BPD:     72.5   %    71 - 87  FL:       43.8  mm     G. Age:  24w 3d         50  %    FL/AC:      23.0   %    20 - 24  HUM:      42.5  mm     G. Age:  25w 4d         78  %  Est. FW:     655  gm      1 lb 7 oz     53  %     FW Discordancy     0 \ 19 % ---------------------------------------------------------------------- Gestational Age (Fetus A)  LMP:           24w 0d        Date:  10/15/17                 EDD:   07/22/18  U/S Today:     24w 1d                                         EDD:   07/21/18  Best:          24w 0d     Det. By:  LMP  (10/15/17)          EDD:   07/22/18 ---------------------------------------------------------------------- Anatomy (Fetus A)  Cranium:               Appears normal         Aortic Arch:            Previously seen  Cavum:                 Appears normal         Ductal Arch:            Appears normal  Ventricles:            Appears normal         Diaphragm:              Appears normal  Choroid Plexus:        Previously seen        Stomach:                Appears normal, left                                                                        sided  Cerebellum:            Previously seen        Abdomen:                Appears normal  Posterior Fossa:       Previously seen        Abdominal Wall:         Previously seen  Nuchal Fold:           Previously seen        Cord Vessels:           Previously seen  Face:                  Profile nl; orbits     Kidneys:                Appear normal                         previously seen  Lips:                  Previously seen        Bladder:                Appears normal  Thoracic:              Appears normal         Spine:                  Previously seen  Heart:  Appears normal         Upper Extremities:      Appears normal                         (4CH, axis, and situs  RVOT:                  Previously seen        Lower Extremities:      Previously seen  LVOT:                  Previously seen  Other:  Fetus appears to be a female. Lt previously heel visualized. ---------------------------------------------------------------------- Doppler - Fetal Vessels (Fetus A)  Umbilical Artery   S/D     %tile                                     PSV                                                   (cm/s)  4.22       76                                     35.28 ---------------------------------------------------------------------- Fetal Evaluation (Fetus B)  Num Of Fetuses:     2  Fetal Heart         154   Rate(bpm):  Cardiac Activity:   Observed  Fetal Lie:          Maternal right side  Presentation:       Breech  Placenta:           Posterior, above cervical os  P. Cord Insertion:  Visualized, central  Membrane Desc:      Dividing Membrane seen - Dichorionic.  Amniotic Fluid  AFI FV:      Subjectively within normal limits                              Largest Pocket(cm)                              3.68 ---------------------------------------------------------------------- Biometry (Fetus B)  BPD:      52.4  mm     G. Age:  21w 6d          1  %    CI:         68.2   %    70 - 86                                                          FL/HC:      19.7   %    18.7 - 20.9  HC:      202.9  mm     G. Age:  22w 3d        < 3  %  HC/AC:      1.13        1.05 - 1.21  AC:      179.1  mm     G. Age:  22w 6d         11  %    FL/BPD:     76.1   %    71 - 87  FL:       39.9  mm     G. Age:  22w 6d         10  %    FL/AC:      22.3   %    20 - 24  HUM:      38.3  mm     G. Age:  23w 4d         31  %  Est. FW:     530  gm      1 lb 3 oz     25  %     FW Discordancy        19  % ---------------------------------------------------------------------- Gestational Age (Fetus B)  LMP:           24w 0d        Date:  10/15/17                 EDD:   07/22/18  U/S Today:     22w 4d                                        EDD:   08/01/18  Best:          24w 0d     Det. By:  LMP  (10/15/17)          EDD:   07/22/18 ---------------------------------------------------------------------- Anatomy (Fetus B)  Cranium:               Appears normal         Aortic Arch:            Previously seen  Cavum:                 Appears normal         Ductal Arch:            Previously seen  Ventricles:            Appears normal         Diaphragm:              Appears normal  Choroid Plexus:        Previously seen        Stomach:                Appears normal, left                                                                        sided  Cerebellum:             Previously seen        Abdomen:  Appears normal  Posterior Fossa:       Previously seen        Abdominal Wall:         Previously seen  Nuchal Fold:           Previously seen        Cord Vessels:           Previously seen  Face:                  Orbits and profile     Kidneys:                Previously seen                         previously seen  Lips:                  Previously seen        Bladder:                Appears normal  Thoracic:              Appears normal         Spine:                  Previously seen  Heart:                 Appears normal         Upper Extremities:      Appears normal                         (4CH, axis, and situs  RVOT:                  Previously seen        Lower Extremities:      Previously seen  LVOT:                  Previously seen  Other:  Fetus appears to be a female. Heels previously visualized. Nasal          bone previously visualized. ---------------------------------------------------------------------- Doppler - Fetal Vessels (Fetus B)  Umbilical Artery                                                            ADFV    RDFV                                                              Yes     Yes ---------------------------------------------------------------------- Cervix Uterus Adnexa  Cervix  Length:           3.45  cm.  Normal appearance by transabdominal scan. ---------------------------------------------------------------------- Impression  Dichorionic-diamniotic twin gestation at 24w 0d.  Twin A:  Maternal left side, breech  presentation.  Placenta posterior, above cervical os.  Appropriate fetal growth.  Normal amniotic fluid volume.  Normal interval fetal anatomy.  Twin B:  Maternal right side, breech presentation.  Placenta posterior, above cervical os.  Composite fetal growth in the 25%ile. AC 11%ile. HC<3%ile,  FL 10%ile.  Normal amniotic fluid volume.  Normal interval fetal anatomy.  UA dopplers with absent end diastolic flow and  intermittent  reversal.  Normal DV dopplers. ---------------------------------------------------------------------- Recommendations  Findings of today's ultrasound discussed. Recommend  admission for fetal monitoring, antenatal corticosteroids, and  neonatology consult given abnormal dopplers.  Repeat dopplers on Monday if fetal monitoring reassuring in  the interim.  Findings discussed with primary OB Dr. Jolayne Panther. Patient  escorted to MAU for registration. ----------------------------------------------------------------------                   Emma Read, MD Electronically Signed Final Report   04/01/2018 04:07 pm ----------------------------------------------------------------------    Assessment/Plan:  32 yo [id number] at 24 weeks with Di-Di twins and CHTN and abnormal fetal dopplers - Admit for observation - Continue labetalol 200 BID and ASA - BMZ today and tomorrow - Follow up dopplers on Monday - NICU consult - Routine antepartum care  Emma [name] 04/01/2018, 4:11 PM

## 2018-04-02 ENCOUNTER — Other Ambulatory Visit: Payer: Self-pay | Admitting: Certified Nurse Midwife

## 2018-04-02 DIAGNOSIS — Z8709 Personal history of other diseases of the respiratory system: Secondary | ICD-10-CM

## 2018-04-02 NOTE — Discharge Instructions (Signed)

## 2018-04-02 NOTE — Progress Notes (Signed)
Unwitnessed prolonged decel down to as low as 90 at nadir for Baby B, lasted about 6 minutes. Now back up to baseline of 135. Resolved without intervention.

## 2018-04-02 NOTE — Discharge Summary (Signed)
OB Discharge Summary     Patient Name: Emma Page DOB: [birth year] MRN: [id number]  Date of admission: 04/01/2018 Delivering MD: This patient has no babies on file.  Date of discharge: 04/02/2018  Admitting diagnosis: 24 WKS Intrauterine pregnancy: 5340w1d     Secondary diagnosis:  Active Problems:   Abnormal fetal ultrasound  Additional problems: Di-Di twins, Abnormal dopplers for twin B     Discharge diagnosis: same                                                                                              Hospital course:  Patient admitted for antepartum observation with Di-Di twins at 24w. Routine ultrasound demonstrated abnormal dopplers in twin B. Patient admitted for continuous fetal monitoring and BMZ. Fetal status remained reassuring. Following her second dose of BMZ, patient opted to leave against medical constraints due to financial constraints.   Physical exam  Vitals:   04/01/18 2342 04/02/18 0817 04/02/18 1140 04/02/18 1600  BP: (!) 134/97 (!) 133/96 125/72 (!) 128/92  Pulse: 93 82 92 91  Resp: 18 18 18 18   Temp: 97.8 F (36.6 C) 98.6 F (37 C) 98.1 F (36.7 C) 98.3 F (36.8 C)  TempSrc: Oral Oral Oral Oral  SpO2: 100% 100% 100% 100%  Weight:      Height:       General: alert, cooperative and no distress LUNGS: Clear to auscultation bilaterally.  HEART: Regular rate and rhythm. ABDOMEN: Soft, nontender, gravid PELVIC: Not indicated EXTREMITIES: No cyanosis, clubbing, or edema, 2+ distal pulses. FHT: baseline 155/140, mod variability, no accels, no decels appropriate for gestational age Toco: no contractions  Labs: Lab Results  Component Value Date   WBC 10.4 04/01/2018   HGB 10.2 (L) 04/01/2018   HCT 30.0 (L) 04/01/2018   MCV 87.5 04/01/2018   PLT 408 (H) 04/01/2018   CMP Latest Ref Rng & Units 04/01/2018  Glucose 70 - 99 mg/dL 83  BUN 6 - 20 mg/dL 9  Creatinine 2.950.44 - 6.211.00 mg/dL 3.080.67  Sodium 657135 - 846145 mmol/L 134(L)  Potassium 3.5 -  5.1 mmol/L 4.4  Chloride 98 - 111 mmol/L 104  CO2 22 - 32 mmol/L 20(L)  Calcium 8.9 - 10.3 mg/dL 9.0  Total Protein 6.5 - 8.1 g/dL 6.8  Total Bilirubin 0.3 - 1.2 mg/dL 0.4  Alkaline Phos 38 - 126 U/L 68  AST 15 - 41 U/L 17  ALT 0 - 44 U/L 16    Discharge instruction: per After Visit Summary and "Baby and Me Booklet".  After visit meds:  Allergies as of 04/02/2018   No Known Allergies     Medication List    STOP taking these medications   CITRANATAL BLOOM 90-1 MG Tabs   metroNIDAZOLE 0.75 % vaginal gel Commonly known as:  METROGEL VAGINAL   promethazine 12.5 MG tablet Commonly known as:  PHENERGAN   terconazole 0.8 % vaginal cream Commonly known as:  TERAZOL 3     TAKE these medications   albuterol 108 (90 Base) MCG/ACT inhaler Commonly known as:  PROVENTIL HFA;VENTOLIN HFA Inhale 2 puffs into the  lungs every 6 (six) hours as needed for wheezing or shortness of breath.   aspirin 81 MG chewable tablet Chew 1 tablet (81 mg total) by mouth daily.   Doxylamine-Pyridoxine 10-10 MG Tbec Commonly known as:  DICLEGIS Take 1 tablet with breakfast and lunch.  Take 2 tablets at bedtime. What changed:    how much to take  how to take this  when to take this  additional instructions   labetalol 200 MG tablet Commonly known as:  NORMODYNE Take 1 tablet (200 mg total) by mouth 2 (two) times daily.   multivitamin with minerals tablet Take 1 tablet by mouth daily.   VITAFOL-NANO 18-0.6-0.4 MG Tabs Take 1 tablet by mouth daily.       Outpatient follow ZO:[id number] for ultrasound Follow up Appt: Future Appointments  Date Time Provider Department Center  04/19/2018 10:00 AM Hermina Staggers, MD [facility] None     04/02/2018 Catalina Antigua, MD

## 2018-04-02 NOTE — Progress Notes (Addendum)
Patient ID: Penelope Galas, female   DOB: 1986/01/15, 32 y.o.   MRN: [id number] FACULTY PRACTICE ANTEPARTUM(COMPREHENSIVE) NOTE  Bluma Buresh is a 32 y.o. [id number] at [redacted]w[redacted]d by best clinical estimate who is admitted for abnormal dopplers on Twin B.   Fetal presentation is breech and breech. Length of Stay:  1  Days  Subjective: Feels well and feels excellent fetal movement Patient reports the fetal movement as active. Patient reports uterine contraction  activity as none. Patient reports  vaginal bleeding as none. Patient describes fluid per vagina as None.  Vitals:  Blood pressure (!) 134/97, pulse 93, temperature 97.8 F (36.6 C), temperature source Oral, resp. rate 18, height 5\' 5"  (1.651 m), weight 208 lb (94.3 kg), last menstrual period 10/15/2017, SpO2 100 %, unknown if currently breastfeeding. Physical Examination:  General appearance - alert, well appearing, and in no distress Chest - normal effort Abdomen - gravid, non-tender Fundal Height:  size equals dates Extremities: Homans sign is negative, no sign of DVT  Membranes:intact  Fetal Monitoring: A Baseline: 135 bpm, Variability: Good {> 6 bpm), Accelerations: Non-reactive but appropriate for gestational age and Decelerations: Absent B Baseline: 150 bpm, Variability: Good {> 6 bpm), Accelerations: Non-reactive but appropriate for gestational age and Decelerations: Absent Labs:  Results for orders placed or performed during the hospital encounter of 04/01/18 (from the past 24 hour(s))  CBC on admission   Collection Time: 04/01/18  5:26 PM  Result Value Ref Range   WBC 10.4 4.0 - 10.5 K/uL   RBC 3.43 (L) 3.87 - 5.11 MIL/uL   Hemoglobin 10.2 (L) 12.0 - 15.0 g/dL   HCT 78.2 (L) 95.6 - 21.3 %   MCV 87.5 78.0 - 100.0 fL   MCH 29.7 26.0 - 34.0 pg   MCHC 34.0 30.0 - 36.0 g/dL   RDW 08.6 57.8 - 46.9 %   Platelets 408 (H) 150 - 400 K/uL  Comprehensive metabolic panel   Collection Time: 04/01/18  5:26 PM  Result  Value Ref Range   Sodium 134 (L) 135 - 145 mmol/L   Potassium 4.4 3.5 - 5.1 mmol/L   Chloride 104 98 - 111 mmol/L   CO2 20 (L) 22 - 32 mmol/L   Glucose, Bld 83 70 - 99 mg/dL   BUN 9 6 - 20 mg/dL   Creatinine, Ser 6.29 0.44 - 1.00 mg/dL   Calcium 9.0 8.9 - 52.8 mg/dL   Total Protein 6.8 6.5 - 8.1 g/dL   Albumin 3.2 (L) 3.5 - 5.0 g/dL   AST 17 15 - 41 U/L   ALT 16 0 - 44 U/L   Alkaline Phosphatase 68 38 - 126 U/L   Total Bilirubin 0.4 0.3 - 1.2 mg/dL   GFR calc non Af Amer >60 >60 mL/min   GFR calc Af Amer >60 >60 mL/min   Anion gap 10 5 - 15  Type and screen [facility]   Collection Time: 04/01/18  5:26 PM  Result Value Ref Range   ABO/RH(D) A NEG    Antibody Screen POS    Sample Expiration 04/04/2018    Antibody Identification      PASSIVELY ACQUIRED ANTI-D Performed at [facility], [address], [place], [place] [address]    Unit Number [id number]    Blood Component Type RED CELLS,LR    Unit division 00    Status of Unit ALLOCATED    Transfusion Status OK TO TRANSFUSE    Crossmatch Result COMPATIBLE    Unit Number  [id number]    Blood Component Type RBC LR PHER1    Unit division 00    Status of Unit ALLOCATED    Transfusion Status OK TO TRANSFUSE    Crossmatch Result COMPATIBLE   BPAM RBC   Collection Time: 04/01/18  5:26 PM  Result Value Ref Range   Blood Product Unit Number [id number]    Unit Type and Rh 0600    Blood Product Expiration Date [id number]    Blood Product Unit Number [id number]    Unit Type and Rh 0600    Blood Product Expiration Date [id number]   Protein / creatinine ratio, urine   Collection Time: 04/01/18  8:00 PM  Result Value Ref Range   Creatinine, Urine 137.00 mg/dL   Total Protein, Urine 15 mg/dL   Protein Creatinine Ratio 0.11 0.00 - 0.15 mg/mg[Cre]    Medications:  Scheduled . aspirin  81 mg Oral Daily  . betamethasone acetate-betamethasone sodium phosphate  12 mg Intramuscular Q24H  .  docusate sodium  100 mg Oral Daily  . labetalol  200 mg Oral BID  . prenatal multivitamin  1 tablet Oral Q1200  . sodium chloride flush  3 mL Intravenous Q12H   I have reviewed the patient's current medications.  ASSESSMENT: Active Problems:   Abnormal fetal ultrasound Abnormal dopplers Twin B CHTN--Ok control  PLAN: To stay on continuous monitoring until f/u u/s on Monday. Patient reports she is leaving AMA today--will stay for 2nd BMZ, she has bills to pay. Would return for u/s Monday and NST tomorrow  Continue Labetalol for BP control Total face-to-face time with patient: 25 minutes. Over 50% of encounter was spent on counseling and coordination of care.  Reva Boresanya S Terita Hejl, MD 04/02/2018,7:40 AM

## 2018-04-02 NOTE — Progress Notes (Signed)
Discharged home AMA, ambulatory.

## 2018-04-02 NOTE — Progress Notes (Signed)
Unwitnessed prolonged deceleration of Baby B down to 60 at nadir. Resolved without intervention, now back up to baseline of 140.

## 2018-04-02 NOTE — Progress Notes (Signed)
Patient states "I want to go home." States she understands that she is advised to stay for continuous fetal monitoring until ultrasound on Monday, but she prefers to go home. Patient signed AMA form.

## 2018-04-03 ENCOUNTER — Inpatient Hospital Stay (HOSPITAL_COMMUNITY)
Admission: AD | Admit: 2018-04-03 | Discharge: 2018-04-03 | Disposition: A | Discharge status: Home / Self Care | Payer: Medicaid Other | Source: Ambulatory Visit | Attending: Obstetrics and Gynecology | Admitting: Obstetrics and Gynecology

## 2018-04-03 ENCOUNTER — Encounter (HOSPITAL_COMMUNITY): Payer: Self-pay | Admitting: *Deleted

## 2018-04-03 DIAGNOSIS — O283 Abnormal ultrasonic finding on antenatal screening of mother: Secondary | ICD-10-CM | POA: Diagnosis present

## 2018-04-03 DIAGNOSIS — O30002 Twin pregnancy, unspecified number of placenta and unspecified number of amniotic sacs, second trimester: Secondary | ICD-10-CM | POA: Insufficient documentation

## 2018-04-03 DIAGNOSIS — Z79899 Other long term (current) drug therapy: Secondary | ICD-10-CM | POA: Insufficient documentation

## 2018-04-03 DIAGNOSIS — Z7982 Long term (current) use of aspirin: Secondary | ICD-10-CM | POA: Insufficient documentation

## 2018-04-03 DIAGNOSIS — O162 Unspecified maternal hypertension, second trimester: Secondary | ICD-10-CM | POA: Diagnosis not present

## 2018-04-03 DIAGNOSIS — O99512 Diseases of the respiratory system complicating pregnancy, second trimester: Secondary | ICD-10-CM | POA: Diagnosis not present

## 2018-04-03 DIAGNOSIS — J45909 Unspecified asthma, uncomplicated: Secondary | ICD-10-CM | POA: Diagnosis not present

## 2018-04-03 DIAGNOSIS — Z3A24 24 weeks gestation of pregnancy: Secondary | ICD-10-CM | POA: Insufficient documentation

## 2018-04-03 LAB — BPAM RBC
BLOOD PRODUCT EXPIRATION DATE: 201907192359
Blood Product Expiration Date: 201907192359
UNIT TYPE AND RH: 600
UNIT TYPE AND RH: 600

## 2018-04-03 LAB — TYPE AND SCREEN
ABO/RH(D): A NEG
ANTIBODY SCREEN: POSITIVE
Unit division: 0
Unit division: 0

## 2018-04-03 NOTE — MAU Provider Note (Signed)
History     CSN: [id number]  Arrival date and time: 04/03/18 1739   First Provider Initiated Contact with Patient 04/03/18 1840      Chief Complaint  Patient presents with  . Non-stress Test   HPI  Ms.  Jahayra Mazo is a 32 y.o. year old [id number] female at [redacted]w[redacted]d weeks twin gestation who presents to MAU reporting she was told to come here for an NST by Dr. Jolayne Panther. She was admitted to [facility] unit on 03/30/18, but left AMA on 04/02/18 d/t "family issues". She also wants to know when she is supposed to have an U/S that was recommended by MFM doctor. She reports good (+) FM x 2 today.  Past Medical History:  Diagnosis Date  . Asthma   . Chlamydia   . Gonorrhea   . Hypertension   . Infection    UTI  . Pregnancy induced hypertension   . Trichomonas infection   . Vaginal Pap smear, abnormal    bx x3    Past Surgical History:  Procedure Laterality Date  . abortion    . DILATION AND CURETTAGE OF UTERUS    . DILATION AND EVACUATION N/A 08/19/2017   Procedure: DILATATION AND EVACUATION;  Surgeon: Willodean Rosenthal, MD;  Location: [facility];  Service: Gynecology;  Laterality: N/A;  . 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 Hypertension Father   . Cancer Maternal Grandmother   . Heart disease Maternal Grandmother   . Cancer Paternal Grandmother   . Diabetes Paternal Grandfather   . Anesthesia problems Neg Hx     Social History   Tobacco Use  . Smoking status: Never Smoker  . Smokeless tobacco: Never Used  Substance Use Topics  . Alcohol use: No    Comment: occasional-socially, 3-4 week; not while pregnant  . Drug use: No    Allergies: No Known Allergies  Medications Prior to Admission  Medication Sig Dispense Refill Last Dose  . aspirin 81 MG chewable tablet Chew 1 tablet (81 mg total) by mouth daily. (Patient not taking: Reported on 04/01/2018) 30 tablet 12 Not Taking at Unknown time  .  Doxylamine-Pyridoxine (DICLEGIS) 10-10 MG TBEC Take 1 tablet with breakfast and lunch.  Take 2 tablets at bedtime. (Patient taking differently: Take 1 tablet by mouth See admin instructions. Take 1 tablet with breakfast and lunch.  Take 2 tablets at bedtime.) 100 tablet 4 03/31/2018 at Unknown time  . labetalol (NORMODYNE) 200 MG tablet Take 1 tablet (200 mg total) by mouth 2 (two) times daily. 60 tablet 3 04/01/2018 at Unknown time  . Multiple Vitamins-Minerals (MULTIVITAMIN WITH MINERALS) tablet Take 1 tablet by mouth daily.   Taking  . Prenatal-Fe Fum-Methf-FA w/o A (VITAFOL-NANO) 18-0.6-0.4 MG TABS Take 1 tablet by mouth daily. 30 tablet 12 Past Week at Unknown time  . PROAIR HFA 108 (90 Base) MCG/ACT inhaler INHALE 2 PUFFS INTO THE LUNGS EVERY 6 HOURS AS NEEDED FOR WHEEZING OR SHORTNESS OF BREATH 8.5 g 5     Review of Systems  Constitutional: Negative.   HENT: Negative.   Eyes: Negative.   Respiratory: Negative.   Cardiovascular: Negative.   Gastrointestinal: Negative.   Endocrine: Negative.   Genitourinary: Negative.   Musculoskeletal: Negative.   Skin: Negative.   Allergic/Immunologic: Negative.   Neurological: Negative.   Hematological: Negative.   Psychiatric/Behavioral: Negative.    Physical Exam   Blood pressure 121/70, pulse Marland Kitchen)  103, temperature 98 F (36.7 C), resp. rate 18, height 5\' 5"  (1.651 m), weight 209 lb (94.8 kg), last menstrual period 10/15/2017.  Physical Exam  Nursing note and vitals reviewed. Constitutional: She is oriented to person, place, and time. She appears well-developed and well-nourished.  HENT:  Head: Normocephalic and atraumatic.  Eyes: Pupils are equal, round, and reactive to light.  Neck: Normal range of motion.  Cardiovascular: Normal rate.  Respiratory: Effort normal.  GI: Soft.  Genitourinary:  Genitourinary Comments: Pelvic deferred  Musculoskeletal: Normal range of motion.  Neurological: She is alert and oriented to person, place, and  time.  Skin: Skin is warm and dry.  Psychiatric: She has a normal mood and affect. Her behavior is normal. Judgment and thought content normal.    MAU Course  Procedures  MDM NST - FHR (A): 160 bpm / moderate variability / accels present / decels absent // FHR (B): 150 bpm / moderate variability / accels absent / decels absent / TOCO: none  *Consult with Dr. Alysia PennaErvin @ 1900 & 2000 - notified of patient's complaints, assessments, lab results, recommended tx plan FP attending will contact MFM to schedule U/S for 04/04/18; offer admission in order to facilitate U/S on 7/1  Assessment and Plan  Abnormal fetal ultrasound - Plan: Discharge patient - Someone will call you to notify you of U/S time on 04/04/18 - [facility] - Patient verbalized an understanding of the plan of care and agrees.    Raelyn Moraolitta Lestat Golob, MSN, CNM 04/03/2018, 6:40 PM

## 2018-04-03 NOTE — MAU Note (Signed)
Reports Dr Jolayne Pantheronstant told her to come today for NST, reports no complaints at this time.

## 2018-04-04 ENCOUNTER — Ambulatory Visit (HOSPITAL_COMMUNITY)
Admission: RE | Admit: 2018-04-04 | Discharge: 2018-04-04 | Disposition: A | Discharge status: Home / Self Care | Payer: Medicaid Other | Source: Ambulatory Visit | Attending: Obstetrics and Gynecology | Admitting: Obstetrics and Gynecology

## 2018-04-04 ENCOUNTER — Other Ambulatory Visit: Payer: Self-pay | Admitting: Obstetrics and Gynecology

## 2018-04-04 ENCOUNTER — Inpatient Hospital Stay (HOSPITAL_COMMUNITY)
Admission: AD | Admit: 2018-04-04 | Discharge: 2018-04-04 | Disposition: A | Discharge status: Home / Self Care | Payer: Medicaid Other | Source: Ambulatory Visit | Attending: Obstetrics & Gynecology | Admitting: Obstetrics & Gynecology

## 2018-04-04 DIAGNOSIS — O10919 Unspecified pre-existing hypertension complicating pregnancy, unspecified trimester: Secondary | ICD-10-CM

## 2018-04-04 NOTE — MAU Note (Addendum)
Patient presents because she states she was told to come in for an ultrasound when she got off work.  Per Donette LarryMelanie Bhambri, CNM patient was supposed to come to MFM for an ultrasound at 1600 today.  Patient told to follow up with [facility] in the morning to schedule an outpatient ultrasound.  Patient agrees to the plan.  Patient is not having any complaints-no pain.  BP noted to be slightly elevated however patient is being followed for chronic hypertension, not currently having any headache, visual disturbances, epigastric pain or abnormal swelling.  Ok to discharge per Venia CarbonJennifer Rasch, NP.  Patient ambulatory and in no distress upon leaving.

## 2018-04-05 ENCOUNTER — Encounter (HOSPITAL_COMMUNITY): Payer: Self-pay | Admitting: Obstetrics and Gynecology

## 2018-04-06 ENCOUNTER — Other Ambulatory Visit (HOSPITAL_COMMUNITY): Payer: Self-pay | Admitting: *Deleted

## 2018-04-06 ENCOUNTER — Ambulatory Visit (HOSPITAL_COMMUNITY)
Admission: RE | Admit: 2018-04-06 | Discharge: 2018-04-06 | Disposition: A | Discharge status: Home / Self Care | Payer: Medicaid Other | Source: Ambulatory Visit | Attending: Obstetrics and Gynecology | Admitting: Obstetrics and Gynecology

## 2018-04-06 ENCOUNTER — Other Ambulatory Visit: Payer: Self-pay | Admitting: Obstetrics and Gynecology

## 2018-04-06 ENCOUNTER — Encounter (HOSPITAL_COMMUNITY): Payer: Self-pay

## 2018-04-06 DIAGNOSIS — O321XX1 Maternal care for breech presentation, fetus 1: Secondary | ICD-10-CM | POA: Insufficient documentation

## 2018-04-06 DIAGNOSIS — O0942 Supervision of pregnancy with grand multiparity, second trimester: Secondary | ICD-10-CM | POA: Diagnosis not present

## 2018-04-06 DIAGNOSIS — Z3A24 24 weeks gestation of pregnancy: Secondary | ICD-10-CM | POA: Insufficient documentation

## 2018-04-06 DIAGNOSIS — O10919 Unspecified pre-existing hypertension complicating pregnancy, unspecified trimester: Secondary | ICD-10-CM | POA: Diagnosis present

## 2018-04-06 DIAGNOSIS — Z363 Encounter for antenatal screening for malformations: Secondary | ICD-10-CM | POA: Insufficient documentation

## 2018-04-06 DIAGNOSIS — O10912 Unspecified pre-existing hypertension complicating pregnancy, second trimester: Secondary | ICD-10-CM | POA: Diagnosis not present

## 2018-04-06 DIAGNOSIS — O99212 Obesity complicating pregnancy, second trimester: Secondary | ICD-10-CM | POA: Diagnosis not present

## 2018-04-06 DIAGNOSIS — O10012 Pre-existing essential hypertension complicating pregnancy, second trimester: Secondary | ICD-10-CM | POA: Insufficient documentation

## 2018-04-06 DIAGNOSIS — O365932 Maternal care for other known or suspected poor fetal growth, third trimester, fetus 2: Secondary | ICD-10-CM | POA: Diagnosis not present

## 2018-04-06 DIAGNOSIS — O30042 Twin pregnancy, dichorionic/diamniotic, second trimester: Secondary | ICD-10-CM

## 2018-04-06 DIAGNOSIS — O30049 Twin pregnancy, dichorionic/diamniotic, unspecified trimester: Secondary | ICD-10-CM

## 2018-04-11 ENCOUNTER — Encounter (HOSPITAL_COMMUNITY): Payer: Self-pay

## 2018-04-11 ENCOUNTER — Ambulatory Visit (HOSPITAL_COMMUNITY)
Admission: RE | Admit: 2018-04-11 | Discharge: 2018-04-11 | Disposition: A | Discharge status: Home / Self Care | Payer: Medicaid Other | Source: Ambulatory Visit | Attending: Obstetrics and Gynecology | Admitting: Obstetrics and Gynecology

## 2018-04-11 ENCOUNTER — Other Ambulatory Visit (HOSPITAL_COMMUNITY): Payer: Self-pay | Admitting: Obstetrics and Gynecology

## 2018-04-11 DIAGNOSIS — O99212 Obesity complicating pregnancy, second trimester: Secondary | ICD-10-CM | POA: Insufficient documentation

## 2018-04-11 DIAGNOSIS — E669 Obesity, unspecified: Secondary | ICD-10-CM | POA: Insufficient documentation

## 2018-04-11 DIAGNOSIS — O10012 Pre-existing essential hypertension complicating pregnancy, second trimester: Secondary | ICD-10-CM | POA: Insufficient documentation

## 2018-04-11 DIAGNOSIS — O360120 Maternal care for anti-D [Rh] antibodies, second trimester, not applicable or unspecified: Secondary | ICD-10-CM | POA: Diagnosis not present

## 2018-04-11 DIAGNOSIS — Z3A25 25 weeks gestation of pregnancy: Secondary | ICD-10-CM | POA: Diagnosis not present

## 2018-04-11 DIAGNOSIS — O30042 Twin pregnancy, dichorionic/diamniotic, second trimester: Secondary | ICD-10-CM | POA: Diagnosis not present

## 2018-04-11 DIAGNOSIS — O30049 Twin pregnancy, dichorionic/diamniotic, unspecified trimester: Secondary | ICD-10-CM

## 2018-04-11 DIAGNOSIS — O283 Abnormal ultrasonic finding on antenatal screening of mother: Secondary | ICD-10-CM

## 2018-04-11 DIAGNOSIS — O0940 Supervision of pregnancy with grand multiparity, unspecified trimester: Secondary | ICD-10-CM | POA: Diagnosis not present

## 2018-04-11 DIAGNOSIS — O321XX1 Maternal care for breech presentation, fetus 1: Secondary | ICD-10-CM | POA: Insufficient documentation

## 2018-04-14 ENCOUNTER — Ambulatory Visit (INDEPENDENT_AMBULATORY_CARE_PROVIDER_SITE_OTHER): Payer: Medicaid Other | Admitting: Obstetrics and Gynecology

## 2018-04-14 ENCOUNTER — Encounter: Payer: Self-pay | Admitting: Obstetrics and Gynecology

## 2018-04-14 ENCOUNTER — Encounter (HOSPITAL_COMMUNITY): Payer: Self-pay

## 2018-04-14 ENCOUNTER — Other Ambulatory Visit (HOSPITAL_COMMUNITY): Payer: Self-pay | Admitting: Obstetrics and Gynecology

## 2018-04-14 ENCOUNTER — Ambulatory Visit (HOSPITAL_COMMUNITY)
Admission: RE | Admit: 2018-04-14 | Discharge: 2018-04-14 | Disposition: A | Discharge status: Home / Self Care | Payer: Medicaid Other | Source: Ambulatory Visit | Attending: Obstetrics and Gynecology | Admitting: Obstetrics and Gynecology

## 2018-04-14 VITALS — BP 130/88 | HR 97 | Wt 213.1 lb

## 2018-04-14 DIAGNOSIS — O09899 Supervision of other high risk pregnancies, unspecified trimester: Secondary | ICD-10-CM

## 2018-04-14 DIAGNOSIS — O30049 Twin pregnancy, dichorionic/diamniotic, unspecified trimester: Secondary | ICD-10-CM

## 2018-04-14 DIAGNOSIS — O365922 Maternal care for other known or suspected poor fetal growth, second trimester, fetus 2: Secondary | ICD-10-CM

## 2018-04-14 DIAGNOSIS — O10919 Unspecified pre-existing hypertension complicating pregnancy, unspecified trimester: Secondary | ICD-10-CM

## 2018-04-14 DIAGNOSIS — Z3A25 25 weeks gestation of pregnancy: Secondary | ICD-10-CM

## 2018-04-14 DIAGNOSIS — O10012 Pre-existing essential hypertension complicating pregnancy, second trimester: Secondary | ICD-10-CM | POA: Diagnosis not present

## 2018-04-14 DIAGNOSIS — Z283 Underimmunization status: Secondary | ICD-10-CM

## 2018-04-14 DIAGNOSIS — O99212 Obesity complicating pregnancy, second trimester: Secondary | ICD-10-CM | POA: Insufficient documentation

## 2018-04-14 DIAGNOSIS — O30042 Twin pregnancy, dichorionic/diamniotic, second trimester: Secondary | ICD-10-CM | POA: Insufficient documentation

## 2018-04-14 DIAGNOSIS — O0990 Supervision of high risk pregnancy, unspecified, unspecified trimester: Secondary | ICD-10-CM

## 2018-04-14 DIAGNOSIS — O9989 Other specified diseases and conditions complicating pregnancy, childbirth and the puerperium: Secondary | ICD-10-CM

## 2018-04-14 DIAGNOSIS — O360122 Maternal care for anti-D [Rh] antibodies, second trimester, fetus 2: Secondary | ICD-10-CM | POA: Diagnosis not present

## 2018-04-14 DIAGNOSIS — Z641 Problems related to multiparity: Secondary | ICD-10-CM | POA: Diagnosis not present

## 2018-04-14 DIAGNOSIS — O0942 Supervision of pregnancy with grand multiparity, second trimester: Secondary | ICD-10-CM

## 2018-04-14 NOTE — Progress Notes (Signed)
   PRENATAL VISIT NOTE  Subjective:  Emma Page is a 32 y.o. [id number] at 9969w6d being seen today for ongoing prenatal care.  She is currently monitored for the following issues for this high-risk pregnancy and has Missed abortion; Rh negative state in antepartum period, first trimester; Dichorionic diamniotic twin pregnancy, antepartum; Chronic hypertension during pregnancy, antepartum; Supervision of high risk pregnancy, antepartum; Grand multiparity; History of asthma; Rubella non-immune status, antepartum; Vitamin D deficiency; Anti-D antibodies present during pregnancy; Anemia in pregnancy; and Abnormal fetal ultrasound on their problem list.  Patient reports no complaints.  Contractions: Not present. Vag. Bleeding: None.  Movement: Present. Denies leaking of fluid.   The following portions of the patient's history were reviewed and updated as appropriate: allergies, current medications, past family history, past medical history, past social history, past surgical history and problem list. Problem list updated.  Objective:   Vitals:   04/14/18 0925  BP: 130/88  Pulse: 97  Weight: 213 lb 1.6 oz (96.7 kg)    Fetal Status: Fetal Heart Rate (bpm): 169/155   Movement: Present     General:  Alert, oriented and cooperative. Patient is in no acute distress.  Skin: Skin is warm and dry. No rash noted.   Cardiovascular: Normal heart rate noted  Respiratory: Normal respiratory effort, no problems with respiration noted  Abdomen: Soft, gravid, appropriate for gestational age.  Pain/Pressure: Absent     Pelvic: Cervical exam deferred        Extremities: Normal range of motion.  Edema: None  Mental Status: Normal mood and affect. Normal behavior. Normal judgment and thought content.   Assessment and Plan:  Pregnancy: [id number] at 5769w6d  1. Supervision of high risk pregnancy, antepartum Patient is doing well without complaints She admits to being anxious regarding the faith of this  pregnancy but has a lot of support Third trimester labs and titers next visit  2. Dichorionic diamniotic twin pregnancy, antepartum Twin B with abnormal dopplers and IUGR- follow up dopplers today and growth ultrasound 7/18  3. Rubella non-immune status, antepartum Will offer pp  4. Chronic hypertension during pregnancy, antepartum Normotensive Continue labetalol and ASA  Preterm labor symptoms and general obstetric precautions including but not limited to vaginal bleeding, contractions, leaking of fluid and fetal movement were reviewed in detail with the patient. Please refer to After Visit Summary for other counseling recommendations.  Return in about 2 weeks (around 04/28/2018) for ROB, 2 hr glucola next visit.  Future Appointments  Date Time Provider Department Center  04/18/2018 10:00 AM [facility] US 3 [facility]  04/21/2018 11:00 AM [facility] US 5 [facility]  04/25/2018  9:30 AM [facility] None  04/25/2018 10:15 AM [name], Michael L, MD [facility] None    Catalina AntiguaPeggy Dion Parrow, MD

## 2018-04-18 ENCOUNTER — Ambulatory Visit (HOSPITAL_COMMUNITY)
Admission: RE | Admit: 2018-04-18 | Discharge: 2018-04-18 | Disposition: A | Discharge status: Home / Self Care | Payer: Medicaid Other | Source: Ambulatory Visit | Attending: Obstetrics and Gynecology | Admitting: Obstetrics and Gynecology

## 2018-04-18 ENCOUNTER — Encounter (HOSPITAL_COMMUNITY): Payer: Self-pay

## 2018-04-18 DIAGNOSIS — O99212 Obesity complicating pregnancy, second trimester: Secondary | ICD-10-CM | POA: Insufficient documentation

## 2018-04-18 DIAGNOSIS — O0942 Supervision of pregnancy with grand multiparity, second trimester: Secondary | ICD-10-CM

## 2018-04-18 DIAGNOSIS — O30042 Twin pregnancy, dichorionic/diamniotic, second trimester: Secondary | ICD-10-CM | POA: Diagnosis not present

## 2018-04-18 DIAGNOSIS — Z3A26 26 weeks gestation of pregnancy: Secondary | ICD-10-CM

## 2018-04-18 DIAGNOSIS — O360120 Maternal care for anti-D [Rh] antibodies, second trimester, not applicable or unspecified: Secondary | ICD-10-CM | POA: Insufficient documentation

## 2018-04-18 DIAGNOSIS — O365922 Maternal care for other known or suspected poor fetal growth, second trimester, fetus 2: Secondary | ICD-10-CM | POA: Diagnosis not present

## 2018-04-18 DIAGNOSIS — O30049 Twin pregnancy, dichorionic/diamniotic, unspecified trimester: Secondary | ICD-10-CM

## 2018-04-18 DIAGNOSIS — O10012 Pre-existing essential hypertension complicating pregnancy, second trimester: Secondary | ICD-10-CM | POA: Insufficient documentation

## 2018-04-19 ENCOUNTER — Encounter: Payer: Medicaid Other | Admitting: Obstetrics and Gynecology

## 2018-04-21 ENCOUNTER — Ambulatory Visit (HOSPITAL_COMMUNITY)
Admission: RE | Admit: 2018-04-21 | Discharge: 2018-04-21 | Disposition: A | Discharge status: Home / Self Care | Payer: Medicaid Other | Source: Ambulatory Visit | Attending: Obstetrics and Gynecology | Admitting: Obstetrics and Gynecology

## 2018-04-21 ENCOUNTER — Other Ambulatory Visit (HOSPITAL_COMMUNITY): Payer: Self-pay | Admitting: *Deleted

## 2018-04-21 ENCOUNTER — Encounter (HOSPITAL_COMMUNITY): Payer: Self-pay

## 2018-04-21 DIAGNOSIS — O0942 Supervision of pregnancy with grand multiparity, second trimester: Secondary | ICD-10-CM | POA: Insufficient documentation

## 2018-04-21 DIAGNOSIS — O30042 Twin pregnancy, dichorionic/diamniotic, second trimester: Secondary | ICD-10-CM | POA: Diagnosis not present

## 2018-04-21 DIAGNOSIS — O99212 Obesity complicating pregnancy, second trimester: Secondary | ICD-10-CM | POA: Diagnosis not present

## 2018-04-21 DIAGNOSIS — O365922 Maternal care for other known or suspected poor fetal growth, second trimester, fetus 2: Secondary | ICD-10-CM

## 2018-04-21 DIAGNOSIS — O321XX1 Maternal care for breech presentation, fetus 1: Secondary | ICD-10-CM | POA: Diagnosis not present

## 2018-04-21 DIAGNOSIS — Z3A26 26 weeks gestation of pregnancy: Secondary | ICD-10-CM | POA: Insufficient documentation

## 2018-04-21 DIAGNOSIS — O26842 Uterine size-date discrepancy, second trimester: Secondary | ICD-10-CM | POA: Insufficient documentation

## 2018-04-21 DIAGNOSIS — O10012 Pre-existing essential hypertension complicating pregnancy, second trimester: Secondary | ICD-10-CM | POA: Insufficient documentation

## 2018-04-21 DIAGNOSIS — E669 Obesity, unspecified: Secondary | ICD-10-CM | POA: Diagnosis not present

## 2018-04-21 DIAGNOSIS — O360120 Maternal care for anti-D [Rh] antibodies, second trimester, not applicable or unspecified: Secondary | ICD-10-CM | POA: Insufficient documentation

## 2018-04-21 DIAGNOSIS — O30049 Twin pregnancy, dichorionic/diamniotic, unspecified trimester: Secondary | ICD-10-CM | POA: Diagnosis present

## 2018-04-21 DIAGNOSIS — O30009 Twin pregnancy, unspecified number of placenta and unspecified number of amniotic sacs, unspecified trimester: Secondary | ICD-10-CM

## 2018-04-25 ENCOUNTER — Other Ambulatory Visit (HOSPITAL_COMMUNITY): Payer: Self-pay | Admitting: Obstetrics and Gynecology

## 2018-04-25 ENCOUNTER — Other Ambulatory Visit: Payer: Medicaid Other

## 2018-04-25 ENCOUNTER — Ambulatory Visit (HOSPITAL_COMMUNITY)
Admission: RE | Admit: 2018-04-25 | Discharge: 2018-04-25 | Disposition: A | Discharge status: Home / Self Care | Payer: Medicaid Other | Source: Ambulatory Visit | Attending: Obstetrics | Admitting: Obstetrics

## 2018-04-25 ENCOUNTER — Encounter (HOSPITAL_COMMUNITY): Payer: Self-pay

## 2018-04-25 ENCOUNTER — Encounter: Payer: Self-pay | Admitting: Obstetrics and Gynecology

## 2018-04-25 ENCOUNTER — Other Ambulatory Visit (HOSPITAL_COMMUNITY): Payer: Self-pay | Admitting: *Deleted

## 2018-04-25 ENCOUNTER — Ambulatory Visit (INDEPENDENT_AMBULATORY_CARE_PROVIDER_SITE_OTHER): Payer: Medicaid Other | Admitting: Obstetrics and Gynecology

## 2018-04-25 VITALS — BP 125/83 | HR 76 | Wt 212.6 lb

## 2018-04-25 DIAGNOSIS — O0942 Supervision of pregnancy with grand multiparity, second trimester: Secondary | ICD-10-CM

## 2018-04-25 DIAGNOSIS — O10012 Pre-existing essential hypertension complicating pregnancy, second trimester: Secondary | ICD-10-CM

## 2018-04-25 DIAGNOSIS — O365922 Maternal care for other known or suspected poor fetal growth, second trimester, fetus 2: Secondary | ICD-10-CM | POA: Diagnosis not present

## 2018-04-25 DIAGNOSIS — O99212 Obesity complicating pregnancy, second trimester: Secondary | ICD-10-CM | POA: Diagnosis not present

## 2018-04-25 DIAGNOSIS — O0990 Supervision of high risk pregnancy, unspecified, unspecified trimester: Secondary | ICD-10-CM

## 2018-04-25 DIAGNOSIS — O30049 Twin pregnancy, dichorionic/diamniotic, unspecified trimester: Secondary | ICD-10-CM

## 2018-04-25 DIAGNOSIS — O30042 Twin pregnancy, dichorionic/diamniotic, second trimester: Secondary | ICD-10-CM | POA: Insufficient documentation

## 2018-04-25 DIAGNOSIS — O10013 Pre-existing essential hypertension complicating pregnancy, third trimester: Secondary | ICD-10-CM | POA: Diagnosis not present

## 2018-04-25 DIAGNOSIS — O10019 Pre-existing essential hypertension complicating pregnancy, unspecified trimester: Secondary | ICD-10-CM | POA: Diagnosis not present

## 2018-04-25 DIAGNOSIS — Z3A27 27 weeks gestation of pregnancy: Secondary | ICD-10-CM | POA: Insufficient documentation

## 2018-04-25 DIAGNOSIS — O360120 Maternal care for anti-D [Rh] antibodies, second trimester, not applicable or unspecified: Secondary | ICD-10-CM | POA: Diagnosis not present

## 2018-04-25 DIAGNOSIS — O0943 Supervision of pregnancy with grand multiparity, third trimester: Secondary | ICD-10-CM

## 2018-04-25 DIAGNOSIS — O10919 Unspecified pre-existing hypertension complicating pregnancy, unspecified trimester: Secondary | ICD-10-CM

## 2018-04-25 DIAGNOSIS — O30009 Twin pregnancy, unspecified number of placenta and unspecified number of amniotic sacs, unspecified trimester: Secondary | ICD-10-CM

## 2018-04-25 DIAGNOSIS — O360131 Maternal care for anti-D [Rh] antibodies, third trimester, fetus 1: Secondary | ICD-10-CM

## 2018-04-25 DIAGNOSIS — O283 Abnormal ultrasonic finding on antenatal screening of mother: Secondary | ICD-10-CM

## 2018-04-25 MED ORDER — ASPIRIN 81 MG PO CHEW: 81.0000 mg | CHEWABLE_TABLET | Freq: Every day | ORAL | 12 refills | Status: DC

## 2018-04-25 NOTE — Addendum Note (Signed)
Addended by: [name], Ancel Easler L on: 04/25/2018 10:50 AM   Modules accepted: Orders

## 2018-04-25 NOTE — Patient Instructions (Signed)
Third Trimester of Pregnancy The third trimester is from week 28 through week 40 (months 7 through 9). The third trimester is a time when the unborn baby (fetus) is growing rapidly. At the end of the ninth month, the fetus is about 20 inches in length and weighs 6-10 pounds. Body changes during your third trimester Your body will continue to go through many changes during pregnancy. The changes vary from woman to woman. During the third trimester:  Your weight will continue to increase. You can expect to gain 25-35 pounds (11-16 kg) by the end of the pregnancy.  You may begin to get stretch marks on your hips, abdomen, and breasts.  You may urinate more often because the fetus is moving lower into your pelvis and pressing on your bladder.  You may develop or continue to have heartburn. This is caused by increased hormones that slow down muscles in the digestive tract.  You may develop or continue to have constipation because increased hormones slow digestion and cause the muscles that push waste through your intestines to relax.  You may develop hemorrhoids. These are swollen veins (varicose veins) in the rectum that can itch or be painful.  You may develop swollen, bulging veins (varicose veins) in your legs.  You may have increased body aches in the pelvis, back, or thighs. This is due to weight gain and increased hormones that are relaxing your joints.  You may have changes in your hair. These can include thickening of your hair, rapid growth, and changes in texture. Some women also have hair loss during or after pregnancy, or hair that feels dry or thin. Your hair will most likely return to normal after your baby is born.  Your breasts will continue to grow and they will continue to become tender. A yellow fluid (colostrum) may leak from your breasts. This is the first milk you are producing for your baby.  Your belly button may stick out.  You may notice more swelling in your hands,  face, or ankles.  You may have increased tingling or numbness in your hands, arms, and legs. The skin on your belly may also feel numb.  You may feel short of breath because of your expanding uterus.  You may have more problems sleeping. This can be caused by the size of your belly, increased need to urinate, and an increase in your body's metabolism.  You may notice the fetus "dropping," or moving lower in your abdomen (lightening).  You may have increased vaginal discharge.  You may notice your joints feel loose and you may have pain around your pelvic bone.  What to expect at prenatal visits You will have prenatal exams every 2 weeks until week 36. Then you will have weekly prenatal exams. During a routine prenatal visit:  You will be weighed to make sure you and the baby are growing normally.  Your blood pressure will be taken.  Your abdomen will be measured to track your baby's growth.  The fetal heartbeat will be listened to.  Any test results from the previous visit will be discussed.  You may have a cervical check near your due date to see if your cervix has softened or thinned (effaced).  You will be tested for Group B streptococcus. This happens between 35 and 37 weeks.  Your health care provider may ask you:  What your birth plan is.  How you are feeling.  If you are feeling the baby move.  If you have had   any abnormal symptoms, such as leaking fluid, bleeding, severe headaches, or abdominal cramping.  If you are using any tobacco products, including cigarettes, chewing tobacco, and electronic cigarettes.  If you have any questions.  Other tests or screenings that may be performed during your third trimester include:  Blood tests that check for low iron levels (anemia).  Fetal testing to check the health, activity level, and growth of the fetus. Testing is done if you have certain medical conditions or if there are problems during the  pregnancy.  Nonstress test (NST). This test checks the health of your baby to make sure there are no signs of problems, such as the baby not getting enough oxygen. During this test, a belt is placed around your belly. The baby is made to move, and its heart rate is monitored during movement.  What is false labor? False labor is a condition in which you feel small, irregular tightenings of the muscles in the womb (contractions) that usually go away with rest, changing position, or drinking water. These are called Braxton Hicks contractions. Contractions may last for hours, days, or even weeks before true labor sets in. If contractions come at regular intervals, become more frequent, increase in intensity, or become painful, you should see your health care provider. What are the signs of labor?  Abdominal cramps.  Regular contractions that start at 10 minutes apart and become stronger and more frequent with time.  Contractions that start on the top of the uterus and spread down to the lower abdomen and back.  Increased pelvic pressure and dull back pain.  A watery or bloody mucus discharge that comes from the vagina.  Leaking of amniotic fluid. This is also known as your "water breaking." It could be a slow trickle or a gush. Let your health care provider know if it has a color or strange odor. If you have any of these signs, call your health care provider right away, even if it is before your due date. Follow these instructions at home: Medicines  Follow your health care provider's instructions regarding medicine use. Specific medicines may be either safe or unsafe to take during pregnancy.  Take a prenatal vitamin that contains at least 600 micrograms (mcg) of folic acid.  If you develop constipation, try taking a stool softener if your health care provider approves. Eating and drinking  Eat a balanced diet that includes fresh fruits and vegetables, whole grains, good sources of protein  such as meat, eggs, or tofu, and low-fat dairy. Your health care provider will help you determine the amount of weight gain that is right for you.  Avoid raw meat and uncooked cheese. These carry germs that can cause birth defects in the baby.  If you have low calcium intake from food, talk to your health care provider about whether you should take a daily calcium supplement.  Eat four or five small meals rather than three large meals a day.  Limit foods that are high in fat and processed sugars, such as fried and sweet foods.  To prevent constipation: ? Drink enough fluid to keep your urine clear or pale yellow. ? Eat foods that are high in fiber, such as fresh fruits and vegetables, whole grains, and beans. Activity  Exercise only as directed by your health care provider. Most women can continue their usual exercise routine during pregnancy. Try to exercise for 30 minutes at least 5 days a week. Stop exercising if you experience uterine contractions.  Avoid heavy   lifting.  Do not exercise in extreme heat or humidity, or at high altitudes.  Wear low-heel, comfortable shoes.  Practice good posture.  You may continue to have sex unless your health care provider tells you otherwise. Relieving pain and discomfort  Take frequent breaks and rest with your legs elevated if you have leg cramps or low back pain.  Take warm sitz baths to soothe any pain or discomfort caused by hemorrhoids. Use hemorrhoid cream if your health care provider approves.  Wear a good support bra to prevent discomfort from breast tenderness.  If you develop varicose veins: ? Wear support pantyhose or compression stockings as told by your healthcare provider. ? Elevate your feet for 15 minutes, 3-4 times a day. Prenatal care  Write down your questions. Take them to your prenatal visits.  Keep all your prenatal visits as told by your health care provider. This is important. Safety  Wear your seat belt at  all times when driving.  Make a list of emergency phone numbers, including numbers for family, friends, the hospital, and police and fire departments. General instructions  Avoid cat litter boxes and soil used by cats. These carry germs that can cause birth defects in the baby. If you have a cat, ask someone to clean the litter box for you.  Do not travel far distances unless it is absolutely necessary and only with the approval of your health care provider.  Do not use hot tubs, steam rooms, or saunas.  Do not drink alcohol.  Do not use any products that contain nicotine or tobacco, such as cigarettes and e-cigarettes. If you need help quitting, ask your health care provider.  Do not use any medicinal herbs or unprescribed drugs. These chemicals affect the formation and growth of the baby.  Do not douche or use tampons or scented sanitary pads.  Do not cross your legs for long periods of time.  To prepare for the arrival of your baby: ? Take prenatal classes to understand, practice, and ask questions about labor and delivery. ? Make a trial run to the hospital. ? Visit the hospital and tour the maternity area. ? Arrange for maternity or paternity leave through employers. ? Arrange for family and friends to take care of pets while you are in the hospital. ? Purchase a rear-facing car seat and make sure you know how to install it in your car. ? Pack your hospital bag. ? Prepare the baby's nursery. Make sure to remove all pillows and stuffed animals from the baby's crib to prevent suffocation.  Visit your dentist if you have not gone during your pregnancy. Use a soft toothbrush to brush your teeth and be gentle when you floss. Contact a health care provider if:  You are unsure if you are in labor or if your water has broken.  You become dizzy.  You have mild pelvic cramps, pelvic pressure, or nagging pain in your abdominal area.  You have lower back pain.  You have persistent  nausea, vomiting, or diarrhea.  You have an unusual or bad smelling vaginal discharge.  You have pain when you urinate. Get help right away if:  Your water breaks before 37 weeks.  You have regular contractions less than 5 minutes apart before 37 weeks.  You have a fever.  You are leaking fluid from your vagina.  You have spotting or bleeding from your vagina.  You have severe abdominal pain or cramping.  You have rapid weight loss or weight gain.    You have shortness of breath with chest pain.  You notice sudden or extreme swelling of your face, hands, ankles, feet, or legs.  Your baby makes fewer than 10 movements in 2 hours.  You have severe headaches that do not go away when you take medicine.  You have vision changes. Summary  The third trimester is from week 28 through week 40, months 7 through 9. The third trimester is a time when the unborn baby (fetus) is growing rapidly.  During the third trimester, your discomfort may increase as you and your baby continue to gain weight. You may have abdominal, leg, and back pain, sleeping problems, and an increased need to urinate.  During the third trimester your breasts will keep growing and they will continue to become tender. A yellow fluid (colostrum) may leak from your breasts. This is the first milk you are producing for your baby.  False labor is a condition in which you feel small, irregular tightenings of the muscles in the womb (contractions) that eventually go away. These are called Braxton Hicks contractions. Contractions may last for hours, days, or even weeks before true labor sets in.  Signs of labor can include: abdominal cramps; regular contractions that start at 10 minutes apart and become stronger and more frequent with time; watery or bloody mucus discharge that comes from the vagina; increased pelvic pressure and dull back pain; and leaking of amniotic fluid. This information is not intended to replace advice  given to you by your health care provider. Make sure you discuss any questions you have with your health care provider. Document Released: 09/15/2001 Document Revised: 02/27/2016 Document Reviewed: 11/22/2012 Elsevier Interactive Patient Education  2017 [employer]  

## 2018-04-25 NOTE — Progress Notes (Signed)
Subjective:  Emma Page is a 32 y.o. [id number] at [redacted]w[redacted]d being seen today for ongoing prenatal care.  She is currently monitored for the following issues for this high-risk pregnancy and has Rh negative state in antepartum period, first trimester; Dichorionic diamniotic twin pregnancy, antepartum; Chronic hypertension during pregnancy, antepartum; Supervision of high risk pregnancy, antepartum; Grand multiparity; History of asthma; Rubella non-immune status, antepartum; Vitamin D deficiency; Anti-D antibodies present during pregnancy; Anemia in pregnancy; and Abnormal fetal ultrasound on their problem list.  Patient reports no complaints.  Contractions: Not present. Vag. Bleeding: None.  Movement: Present. Denies leaking of fluid.   The following portions of the patient's history were reviewed and updated as appropriate: allergies, current medications, past family history, past medical history, past social history, past surgical history and problem list. Problem list updated.  Objective:   Vitals:   04/25/18 1024  BP: 125/83  Pulse: 76  Weight: 212 lb 9.6 oz (96.4 kg)    Fetal Status: Fetal Heart Rate (bpm): 145/154   Movement: Present     General:  Alert, oriented and cooperative. Patient is in no acute distress.  Skin: Skin is warm and dry. No rash noted.   Cardiovascular: Normal heart rate noted  Respiratory: Normal respiratory effort, no problems with respiration noted  Abdomen: Soft, gravid, appropriate for gestational age. Pain/Pressure: Absent     Pelvic:  Cervical exam deferred        Extremities: Normal range of motion.  Edema: None  Mental Status: Normal mood and affect. Normal behavior. Normal judgment and thought content.   Urinalysis:      Assessment and Plan:  Pregnancy: [id number] at [redacted]w[redacted]d  1. Supervision of high risk pregnancy, antepartum Stable Eat today so Glucola and 28 week labs rescheduled for later this week  2. Dichorionic diamniotic twin pregnancy,  antepartum   3. Chronic hypertension during pregnancy, antepartum BP stable Continue with BASA and Labetalol - aspirin 81 MG chewable tablet; Chew 1 tablet (81 mg total) by mouth daily.  Dispense: 30 tablet; Refill: 12  4. Anti-D antibodies present during pregnancy in third trimester, fetus 1 of multiple gestation Last titer to weak to qualitive Will repeat with 28 week labs  5. Abnormal fetal ultrasound Tw A appropriate interval growth on 04/21/18 Tw B REDF noted. Repeat Doppler studies 04/28/18  Preterm labor symptoms and general obstetric precautions including but not limited to vaginal bleeding, contractions, leaking of fluid and fetal movement were reviewed in detail with the patient. Please refer to After Visit Summary for other counseling recommendations.  Return in about 2 weeks (around 05/09/2018) for OB visit.   [name], [name], MD

## 2018-04-28 ENCOUNTER — Encounter (HOSPITAL_COMMUNITY): Payer: Self-pay

## 2018-04-28 ENCOUNTER — Other Ambulatory Visit: Payer: Medicaid Other

## 2018-04-28 ENCOUNTER — Ambulatory Visit (HOSPITAL_COMMUNITY)
Admission: RE | Admit: 2018-04-28 | Discharge: 2018-04-28 | Disposition: A | Discharge status: Home / Self Care | Payer: Medicaid Other | Source: Ambulatory Visit | Attending: Obstetrics and Gynecology | Admitting: Obstetrics and Gynecology

## 2018-04-28 ENCOUNTER — Other Ambulatory Visit (HOSPITAL_COMMUNITY): Payer: Self-pay | Admitting: *Deleted

## 2018-04-28 DIAGNOSIS — Z3A27 27 weeks gestation of pregnancy: Secondary | ICD-10-CM | POA: Insufficient documentation

## 2018-04-28 DIAGNOSIS — O10012 Pre-existing essential hypertension complicating pregnancy, second trimester: Secondary | ICD-10-CM | POA: Insufficient documentation

## 2018-04-28 DIAGNOSIS — O365922 Maternal care for other known or suspected poor fetal growth, second trimester, fetus 2: Secondary | ICD-10-CM | POA: Insufficient documentation

## 2018-04-28 DIAGNOSIS — O360120 Maternal care for anti-D [Rh] antibodies, second trimester, not applicable or unspecified: Secondary | ICD-10-CM | POA: Insufficient documentation

## 2018-04-28 DIAGNOSIS — O0942 Supervision of pregnancy with grand multiparity, second trimester: Secondary | ICD-10-CM | POA: Diagnosis not present

## 2018-04-28 DIAGNOSIS — O30009 Twin pregnancy, unspecified number of placenta and unspecified number of amniotic sacs, unspecified trimester: Secondary | ICD-10-CM

## 2018-04-28 DIAGNOSIS — Z3689 Encounter for other specified antenatal screening: Secondary | ICD-10-CM | POA: Diagnosis present

## 2018-04-28 DIAGNOSIS — O10013 Pre-existing essential hypertension complicating pregnancy, third trimester: Secondary | ICD-10-CM | POA: Diagnosis not present

## 2018-04-28 DIAGNOSIS — E669 Obesity, unspecified: Secondary | ICD-10-CM | POA: Insufficient documentation

## 2018-04-28 DIAGNOSIS — O30049 Twin pregnancy, dichorionic/diamniotic, unspecified trimester: Secondary | ICD-10-CM

## 2018-04-28 DIAGNOSIS — O30042 Twin pregnancy, dichorionic/diamniotic, second trimester: Secondary | ICD-10-CM | POA: Diagnosis not present

## 2018-04-28 DIAGNOSIS — O99212 Obesity complicating pregnancy, second trimester: Secondary | ICD-10-CM | POA: Diagnosis not present

## 2018-04-29 LAB — GLUCOSE TOLERANCE, 2 HOURS W/ 1HR
GLUCOSE, 1 HOUR: 113 mg/dL (ref 65–179)
GLUCOSE, FASTING: 77 mg/dL (ref 65–91)

## 2018-04-29 LAB — CBC
Hematocrit: 31.5 % — ABNORMAL LOW (ref 34.0–46.6)
Hemoglobin: 10.5 g/dL — ABNORMAL LOW (ref 11.1–15.9)
MCH: 28.9 pg (ref 26.6–33.0)
MCHC: 33.3 g/dL (ref 31.5–35.7)
MCV: 87 fL (ref 79–97)
PLATELETS: 387 10*3/uL (ref 150–450)
RBC: 3.63 x10E6/uL — ABNORMAL LOW (ref 3.77–5.28)
RDW: 13.9 % (ref 12.3–15.4)
WBC: 9.4 10*3/uL (ref 3.4–10.8)

## 2018-04-29 LAB — ANTIBODY SCREEN: Antibody Screen: NEGATIVE

## 2018-04-29 LAB — HIV ANTIBODY (ROUTINE TESTING W REFLEX): HIV Screen 4th Generation wRfx: NONREACTIVE

## 2018-04-29 LAB — RPR: RPR Ser Ql: NONREACTIVE

## 2018-05-02 ENCOUNTER — Encounter (HOSPITAL_COMMUNITY): Payer: Self-pay

## 2018-05-02 ENCOUNTER — Ambulatory Visit (HOSPITAL_COMMUNITY)
Admission: RE | Admit: 2018-05-02 | Discharge: 2018-05-02 | Disposition: A | Discharge status: Home / Self Care | Payer: Medicaid Other | Source: Ambulatory Visit | Attending: Obstetrics | Admitting: Obstetrics

## 2018-05-02 DIAGNOSIS — O99212 Obesity complicating pregnancy, second trimester: Secondary | ICD-10-CM | POA: Diagnosis not present

## 2018-05-02 DIAGNOSIS — O0943 Supervision of pregnancy with grand multiparity, third trimester: Secondary | ICD-10-CM | POA: Diagnosis not present

## 2018-05-02 DIAGNOSIS — O30049 Twin pregnancy, dichorionic/diamniotic, unspecified trimester: Secondary | ICD-10-CM | POA: Diagnosis present

## 2018-05-02 DIAGNOSIS — O10013 Pre-existing essential hypertension complicating pregnancy, third trimester: Secondary | ICD-10-CM | POA: Diagnosis not present

## 2018-05-02 DIAGNOSIS — O360120 Maternal care for anti-D [Rh] antibodies, second trimester, not applicable or unspecified: Secondary | ICD-10-CM | POA: Diagnosis not present

## 2018-05-02 DIAGNOSIS — Z3A28 28 weeks gestation of pregnancy: Secondary | ICD-10-CM

## 2018-05-02 DIAGNOSIS — O30043 Twin pregnancy, dichorionic/diamniotic, third trimester: Secondary | ICD-10-CM

## 2018-05-02 DIAGNOSIS — O365922 Maternal care for other known or suspected poor fetal growth, second trimester, fetus 2: Secondary | ICD-10-CM

## 2018-05-02 DIAGNOSIS — O30042 Twin pregnancy, dichorionic/diamniotic, second trimester: Secondary | ICD-10-CM | POA: Diagnosis not present

## 2018-05-05 ENCOUNTER — Ambulatory Visit (HOSPITAL_COMMUNITY)
Admission: RE | Admit: 2018-05-05 | Discharge: 2018-05-05 | Disposition: A | Discharge status: Home / Self Care | Payer: Medicaid Other | Source: Ambulatory Visit | Attending: Obstetrics and Gynecology | Admitting: Obstetrics and Gynecology

## 2018-05-05 ENCOUNTER — Other Ambulatory Visit (HOSPITAL_COMMUNITY): Payer: Self-pay | Admitting: Obstetrics and Gynecology

## 2018-05-05 ENCOUNTER — Encounter (HOSPITAL_COMMUNITY): Payer: Self-pay

## 2018-05-05 DIAGNOSIS — O365932 Maternal care for other known or suspected poor fetal growth, third trimester, fetus 2: Secondary | ICD-10-CM | POA: Diagnosis not present

## 2018-05-05 DIAGNOSIS — O360130 Maternal care for anti-D [Rh] antibodies, third trimester, not applicable or unspecified: Secondary | ICD-10-CM | POA: Diagnosis not present

## 2018-05-05 DIAGNOSIS — O30009 Twin pregnancy, unspecified number of placenta and unspecified number of amniotic sacs, unspecified trimester: Secondary | ICD-10-CM

## 2018-05-05 DIAGNOSIS — Z6791 Unspecified blood type, Rh negative: Secondary | ICD-10-CM

## 2018-05-05 DIAGNOSIS — Z3A28 28 weeks gestation of pregnancy: Secondary | ICD-10-CM

## 2018-05-05 DIAGNOSIS — O163 Unspecified maternal hypertension, third trimester: Secondary | ICD-10-CM | POA: Diagnosis not present

## 2018-05-05 DIAGNOSIS — O30043 Twin pregnancy, dichorionic/diamniotic, third trimester: Secondary | ICD-10-CM | POA: Diagnosis not present

## 2018-05-05 DIAGNOSIS — E669 Obesity, unspecified: Secondary | ICD-10-CM | POA: Diagnosis not present

## 2018-05-05 DIAGNOSIS — O0943 Supervision of pregnancy with grand multiparity, third trimester: Secondary | ICD-10-CM | POA: Diagnosis not present

## 2018-05-05 DIAGNOSIS — O10919 Unspecified pre-existing hypertension complicating pregnancy, unspecified trimester: Secondary | ICD-10-CM

## 2018-05-05 DIAGNOSIS — O99213 Obesity complicating pregnancy, third trimester: Secondary | ICD-10-CM | POA: Insufficient documentation

## 2018-05-05 DIAGNOSIS — O30003 Twin pregnancy, unspecified number of placenta and unspecified number of amniotic sacs, third trimester: Secondary | ICD-10-CM | POA: Diagnosis not present

## 2018-05-05 DIAGNOSIS — O26891 Other specified pregnancy related conditions, first trimester: Secondary | ICD-10-CM

## 2018-05-05 DIAGNOSIS — O30049 Twin pregnancy, dichorionic/diamniotic, unspecified trimester: Secondary | ICD-10-CM

## 2018-05-09 ENCOUNTER — Other Ambulatory Visit (HOSPITAL_COMMUNITY): Payer: Self-pay | Admitting: Obstetrics and Gynecology

## 2018-05-09 ENCOUNTER — Encounter (HOSPITAL_COMMUNITY): Payer: Self-pay

## 2018-05-09 ENCOUNTER — Ambulatory Visit (HOSPITAL_COMMUNITY)
Admission: RE | Admit: 2018-05-09 | Discharge: 2018-05-09 | Disposition: A | Discharge status: Home / Self Care | Payer: Medicaid Other | Source: Ambulatory Visit | Attending: Obstetrics | Admitting: Obstetrics

## 2018-05-09 ENCOUNTER — Encounter: Payer: Medicaid Other | Admitting: Obstetrics and Gynecology

## 2018-05-09 DIAGNOSIS — O26891 Other specified pregnancy related conditions, first trimester: Secondary | ICD-10-CM

## 2018-05-09 DIAGNOSIS — O30043 Twin pregnancy, dichorionic/diamniotic, third trimester: Secondary | ICD-10-CM | POA: Diagnosis not present

## 2018-05-09 DIAGNOSIS — O30049 Twin pregnancy, dichorionic/diamniotic, unspecified trimester: Secondary | ICD-10-CM

## 2018-05-09 DIAGNOSIS — O10919 Unspecified pre-existing hypertension complicating pregnancy, unspecified trimester: Secondary | ICD-10-CM

## 2018-05-09 DIAGNOSIS — Z3A29 29 weeks gestation of pregnancy: Secondary | ICD-10-CM | POA: Diagnosis not present

## 2018-05-09 DIAGNOSIS — O10013 Pre-existing essential hypertension complicating pregnancy, third trimester: Secondary | ICD-10-CM | POA: Diagnosis not present

## 2018-05-09 DIAGNOSIS — Z6791 Unspecified blood type, Rh negative: Secondary | ICD-10-CM

## 2018-05-09 DIAGNOSIS — O99213 Obesity complicating pregnancy, third trimester: Secondary | ICD-10-CM

## 2018-05-09 DIAGNOSIS — Z362 Encounter for other antenatal screening follow-up: Secondary | ICD-10-CM | POA: Diagnosis not present

## 2018-05-09 DIAGNOSIS — O163 Unspecified maternal hypertension, third trimester: Secondary | ICD-10-CM

## 2018-05-11 ENCOUNTER — Telehealth: Payer: Self-pay | Admitting: *Deleted

## 2018-05-11 NOTE — Telephone Encounter (Signed)
Called patient to reschedule missed OB appt on 05/09/2018, no answer and unable to leave a voicemail.Emma Page.Emma Page..Emma Page

## 2018-05-12 ENCOUNTER — Other Ambulatory Visit (HOSPITAL_COMMUNITY): Payer: Self-pay | Admitting: Obstetrics and Gynecology

## 2018-05-12 ENCOUNTER — Inpatient Hospital Stay (HOSPITAL_COMMUNITY)
Admission: AD | Admit: 2018-05-12 | Discharge: 2018-05-12 | Discharge status: Against Medical Advice | Payer: Medicaid Other | Source: Ambulatory Visit | Attending: Obstetrics & Gynecology | Admitting: Obstetrics & Gynecology

## 2018-05-12 ENCOUNTER — Encounter (HOSPITAL_COMMUNITY): Payer: Self-pay

## 2018-05-12 ENCOUNTER — Ambulatory Visit (HOSPITAL_COMMUNITY)
Admission: RE | Admit: 2018-05-12 | Discharge: 2018-05-12 | Disposition: A | Discharge status: Home / Self Care | Payer: Medicaid Other | Source: Ambulatory Visit | Attending: Obstetrics and Gynecology | Admitting: Obstetrics and Gynecology

## 2018-05-12 DIAGNOSIS — O163 Unspecified maternal hypertension, third trimester: Secondary | ICD-10-CM | POA: Insufficient documentation

## 2018-05-12 DIAGNOSIS — O365912 Maternal care for other known or suspected poor fetal growth, first trimester, fetus 2: Secondary | ICD-10-CM

## 2018-05-12 DIAGNOSIS — O479 False labor, unspecified: Secondary | ICD-10-CM

## 2018-05-12 DIAGNOSIS — Z641 Problems related to multiparity: Secondary | ICD-10-CM

## 2018-05-12 DIAGNOSIS — Z79899 Other long term (current) drug therapy: Secondary | ICD-10-CM | POA: Diagnosis not present

## 2018-05-12 DIAGNOSIS — O26893 Other specified pregnancy related conditions, third trimester: Secondary | ICD-10-CM | POA: Diagnosis not present

## 2018-05-12 DIAGNOSIS — O10013 Pre-existing essential hypertension complicating pregnancy, third trimester: Secondary | ICD-10-CM

## 2018-05-12 DIAGNOSIS — Z3A29 29 weeks gestation of pregnancy: Secondary | ICD-10-CM | POA: Diagnosis present

## 2018-05-12 DIAGNOSIS — O30049 Twin pregnancy, dichorionic/diamniotic, unspecified trimester: Secondary | ICD-10-CM

## 2018-05-12 DIAGNOSIS — O99213 Obesity complicating pregnancy, third trimester: Secondary | ICD-10-CM | POA: Diagnosis not present

## 2018-05-12 DIAGNOSIS — O10919 Unspecified pre-existing hypertension complicating pregnancy, unspecified trimester: Secondary | ICD-10-CM

## 2018-05-12 DIAGNOSIS — O10913 Unspecified pre-existing hypertension complicating pregnancy, third trimester: Secondary | ICD-10-CM | POA: Diagnosis not present

## 2018-05-12 DIAGNOSIS — O365932 Maternal care for other known or suspected poor fetal growth, third trimester, fetus 2: Secondary | ICD-10-CM | POA: Insufficient documentation

## 2018-05-12 DIAGNOSIS — Z8249 Family history of ischemic heart disease and other diseases of the circulatory system: Secondary | ICD-10-CM | POA: Insufficient documentation

## 2018-05-12 DIAGNOSIS — O99513 Diseases of the respiratory system complicating pregnancy, third trimester: Secondary | ICD-10-CM | POA: Insufficient documentation

## 2018-05-12 DIAGNOSIS — J45909 Unspecified asthma, uncomplicated: Secondary | ICD-10-CM | POA: Diagnosis not present

## 2018-05-12 DIAGNOSIS — O30043 Twin pregnancy, dichorionic/diamniotic, third trimester: Secondary | ICD-10-CM | POA: Insufficient documentation

## 2018-05-12 DIAGNOSIS — Z6791 Unspecified blood type, Rh negative: Secondary | ICD-10-CM | POA: Diagnosis not present

## 2018-05-12 DIAGNOSIS — O360130 Maternal care for anti-D [Rh] antibodies, third trimester, not applicable or unspecified: Secondary | ICD-10-CM

## 2018-05-12 DIAGNOSIS — Z7982 Long term (current) use of aspirin: Secondary | ICD-10-CM | POA: Insufficient documentation

## 2018-05-12 DIAGNOSIS — O26891 Other specified pregnancy related conditions, first trimester: Secondary | ICD-10-CM

## 2018-05-12 DIAGNOSIS — O30009 Twin pregnancy, unspecified number of placenta and unspecified number of amniotic sacs, unspecified trimester: Secondary | ICD-10-CM

## 2018-05-12 DIAGNOSIS — O4700 False labor before 37 completed weeks of gestation, unspecified trimester: Secondary | ICD-10-CM

## 2018-05-12 MED ORDER — LACTATED RINGERS IV BOLUS: 1000.0000 mL | Freq: Once | INTRAVENOUS | Status: DC

## 2018-05-12 MED ORDER — NIFEDIPINE 10 MG PO CAPS
10.0000 mg | ORAL_CAPSULE | ORAL | Status: AC
  Administered 2018-05-12 (×3): 10 mg via ORAL
  Filled 2018-05-12 (×3): qty 1

## 2018-05-12 NOTE — MAU Note (Signed)
Urine in lab 

## 2018-05-12 NOTE — Procedures (Signed)
GrenadaBrittany Page [birth year] [id number] Lowanda FosterBrittany Falero [birth year] [id number]  Fetus A Non-Stress Test Interpretation for 05/12/18  Indication: IUGR  Fetal Heart Rate A Mode: External Baseline Rate (A): 150 bpm Variability: Moderate Accelerations: 10 x 10 Decelerations: Variable Multiple birth?: Yes  Uterine Activity Mode: Toco Contraction Frequency (min): 2-5 with UI noted Contraction Duration (sec): 40-60 Contraction Quality: Mild Resting Tone Palpated: Relaxed Resting Time: Adequate  Interpretation (Fetal Testing) Nonstress Test Interpretation: Reactive Comments: FHR tracing rev'd by Dr. Judeth CornfieldShankar    Fetus B Non-Stress Test Interpretation for 05/12/18  Indication: IUGR  Fetal Heart Rate Fetus B Mode: External Baseline Rate (B): 140 BPM Variability: Moderate Accelerations: 10 x 10 Decelerations: Prolonged(one episode noted, FHR down to 100 with return to baseline lasting 3.665min)  Uterine Activity Mode: Toco Contraction Frequency (min): 2-5 with UI noted Contraction Duration (sec): 40-60 Contraction Quality: Mild Resting Tone Palpated: Relaxed Resting Time: Adequate  Interpretation (Baby B - Fetal Testing) Nonstress Test Interpretation (Baby B): Reactive Comments (Baby B): FHR tracing rev'd by Dr. Judeth CornfieldShankar

## 2018-05-12 NOTE — Progress Notes (Signed)
Faculty Note  Patient with di/di twins, reverse absent end diastolic flow in Twin B. BPP for both twins today 8/8 however with decel in Twin B, sent for prolonged monitoring. She continues to have occasional decel Twin B. Reviewed tracing with MFM and they are in agreement for recommendation for admission to hospital for monitoring with plan for delivery at 32 weeks.   I reviewed the above with the patient including the fact that due [facility], [facility] given the prematurity of the twins. [facility] is [facility]. I reviewed the risks of leaving against medical advice including harm and potential death to both twins. I had long discussion about that while twins are stable enough to not proceed with immediate delivery, they are not stable enough for discharge home, even with close follow up. I strongly recommended [facility] and again reviewed risks of leaving AMA, including potential fetal death and other injury associated with poor fetal blood flow. [facility].   During this conversation, contractions picked up again and are more regular. She is not feeling them, will give IV fluids and see if contractions space out.    Baldemar LenisK. Meryl Davis, M.D. [facility] for [facility]

## 2018-05-12 NOTE — MAU Note (Signed)
After conversation with Dr Earlene Plateravis, RN came into room to start IV fluids. Pt asked what they were for and RN explained.  Pt then said she wanted to leave and that she "was done, my BP is up and I am irritated." Patient explained that she couldn't get [facility] because of family obligations. She has other children who need her at home and she can't be away from home. The CNM,  M. Denyse AmassBhambri came and spoke to patient, but she decided to leave AMA.  Patient given precautions about when to come back and she said she understands.  Patient left with her daughters.

## 2018-05-12 NOTE — Progress Notes (Signed)
Alerted by CNM that patient left AMA. Patient was to get IVF for contractions on monitor that she was not feeling.   She had previously been counseled about recommendation for admission and need [facility]. She refused admission/transfer. She was also counseled at that time about need to return for MFM appointment previously scheduled for tomorrow. She was counseled that she should return to MAU at any time for any changes in status, contractions, leaking, bleeding, decreased fetal movement. She was strongly cautioned to return should she have any issues. She was counseled that we are available to provide any health care she requires and that her leaving against medical advice would not preclude her from returning to this hospital for care. She was also previously counseled about risks to twins including risk of death given their unstable condition. She verbalized understanding of all of the above.  Per RN and CNM, patient refused IV fluids and signed AMA paperwork.    Baldemar LenisK. Meryl Erika Slaby, M.D. [facility]

## 2018-05-12 NOTE — ED Notes (Signed)
Report called to GrenadaBrittany, RN in MAU.  Pt to MAU for evaluation.

## 2018-05-12 NOTE — MAU Note (Signed)
[id number], di di twins, Pt referred from MFM for abnormal doppler; Baby b 3 min decel; cntx 2-5 mins apart.

## 2018-05-12 NOTE — MAU Provider Note (Signed)
History     CSN: [id number]  Arrival date and time: 05/12/18 1555   First Provider Initiated Contact with Patient 05/12/18 1643      Chief Complaint  Patient presents with  . Referral    [id number] @29 .6 with didi twins sent from MFM for reverse flow dopplers. Pt reports ctx q2 min "for a long time", at least since last week. Good FM x2. No VB, LOF, or vaginal discharge.    OB History    Gravida  16   Para  3   Term  3   Preterm  0   AB  12   Living  3     SAB  7   TAB  5   Ectopic  0   Multiple  0   Live Births  3           Past Medical History:  Diagnosis Date  . Asthma   . Chlamydia   . Gonorrhea   . Hypertension   . Infection    UTI  . Pregnancy induced hypertension   . Trichomonas infection   . Vaginal Pap smear, abnormal    bx x3    Past Surgical History:  Procedure Laterality Date  . abortion    . DILATION AND CURETTAGE OF UTERUS    . DILATION AND EVACUATION N/A 08/19/2017   Procedure: DILATATION AND EVACUATION;  Surgeon: Willodean Rosenthal, MD;  Location: [facility];  Service: Gynecology;  Laterality: N/A;  . 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 Hypertension Father   . Cancer Maternal Grandmother   . Heart disease Maternal Grandmother   . Cancer Paternal Grandmother   . Diabetes Paternal Grandfather   . Anesthesia problems Neg Hx     Social History   Tobacco Use  . Smoking status: Never Smoker  . Smokeless tobacco: Never Used  Substance Use Topics  . Alcohol use: No    Comment: occasional-socially, 3-4 week; not while pregnant  . Drug use: No    Allergies: No Known Allergies  Medications Prior to Admission  Medication Sig Dispense Refill Last Dose  . aspirin 81 MG chewable tablet Chew 1 tablet (81 mg total) by mouth daily. 30 tablet 12 05/11/2018 at Unknown time  . Doxylamine-Pyridoxine (DICLEGIS) 10-10 MG TBEC Take 1 tablet with breakfast and lunch.   Take 2 tablets at bedtime. 100 tablet 4 Past Week at Unknown time  . labetalol (NORMODYNE) 200 MG tablet Take 1 tablet (200 mg total) by mouth 2 (two) times daily. 60 tablet 3 05/12/2018 at Unknown time  . Prenatal-Fe Fum-Methf-FA w/o A (VITAFOL-NANO) 18-0.6-0.4 MG TABS Take 1 tablet by mouth daily. 30 tablet 12 05/12/2018 at Unknown time  . PROAIR HFA 108 (90 Base) MCG/ACT inhaler INHALE 2 PUFFS INTO THE LUNGS EVERY 6 HOURS AS NEEDED FOR WHEEZING OR SHORTNESS OF BREATH 8.5 g 5 Taking    Review of Systems  Gastrointestinal: Positive for abdominal pain.  Genitourinary: Negative for vaginal bleeding and vaginal discharge.   Physical Exam   Blood pressure (!) 143/99, pulse 97, temperature 98.5 F (36.9 C), temperature source Oral, resp. rate 17, height 5\' 5"  (1.651 m), weight 97.1 kg, last menstrual period 10/15/2017, SpO2 100 %, unknown if currently breastfeeding.  Physical Exam  Constitutional: She is oriented to person, place, and time. She appears well-developed and well-nourished. No distress.  HENT:  Head: Normocephalic and atraumatic.  Neck: Normal range of motion.  Cardiovascular: Normal rate.  Respiratory: Effort normal. No respiratory distress.  GI: Soft. She exhibits no distension. There is no tenderness.  gravid  Genitourinary:  Genitourinary Comments: SVE: closed/thick  Musculoskeletal: Normal range of motion.  Neurological: She is alert and oriented to person, place, and time.  Skin: Skin is warm and dry.  Psychiatric: She has a normal mood and affect.  EFM-A: 155 bpm, mod variability, + accels, rare variable decels EFM-B: 145 bpm, mod variability, + accels, rare variable decels Toco: irritability q2 min  No results found for this or any previous visit (from the past 24 hour(s)).  MAU Course  Procedures Procardia x3  MDM UI improved after Procardia, pt feels better. Consult with Dr. Earlene Plateravis regarding variable seen on EFM and UI. Dr. Earlene Plateravis in to see pt, recommend admit  (NICU full, would need transfer). [facility]. UI now worsened, will start LR bolus. If improves then can [facility] (if [facility]) or would have to leave AMA. 1955: [facility], states "I have other kids, and that's too far". Discussed would be leaving AMA and accepting risk, pt accepts this.  Assessment and Plan   1. [redacted] weeks gestation of pregnancy   2. Rh negative state in antepartum period, first trimester   3. Dichorionic diamniotic twin pregnancy, antepartum   4. Chronic hypertension during pregnancy, antepartum   5. Preterm contractions   6. Dichorionic diamniotic twin pregnancy in third trimester    Left AMA, form signed   Emma LarryBhambri, Mallisa [place], [place]  05/12/2018 8:05 PM

## 2018-05-13 ENCOUNTER — Encounter (HOSPITAL_COMMUNITY): Payer: Self-pay

## 2018-05-13 ENCOUNTER — Ambulatory Visit (HOSPITAL_COMMUNITY): Admit: 2018-05-13 | Payer: Medicaid Other

## 2018-05-13 ENCOUNTER — Ambulatory Visit (HOSPITAL_COMMUNITY)
Admission: RE | Admit: 2018-05-13 | Discharge: 2018-05-13 | Disposition: A | Discharge status: Home / Self Care | Payer: Medicaid Other | Source: Ambulatory Visit | Attending: Obstetrics and Gynecology | Admitting: Obstetrics and Gynecology

## 2018-05-13 DIAGNOSIS — Z6791 Unspecified blood type, Rh negative: Secondary | ICD-10-CM

## 2018-05-13 DIAGNOSIS — O30003 Twin pregnancy, unspecified number of placenta and unspecified number of amniotic sacs, third trimester: Secondary | ICD-10-CM | POA: Diagnosis not present

## 2018-05-13 DIAGNOSIS — Z3A30 30 weeks gestation of pregnancy: Secondary | ICD-10-CM | POA: Insufficient documentation

## 2018-05-13 DIAGNOSIS — O26891 Other specified pregnancy related conditions, first trimester: Secondary | ICD-10-CM

## 2018-05-13 DIAGNOSIS — O10919 Unspecified pre-existing hypertension complicating pregnancy, unspecified trimester: Secondary | ICD-10-CM

## 2018-05-13 DIAGNOSIS — Z3689 Encounter for other specified antenatal screening: Secondary | ICD-10-CM | POA: Diagnosis not present

## 2018-05-13 DIAGNOSIS — O30049 Twin pregnancy, dichorionic/diamniotic, unspecified trimester: Secondary | ICD-10-CM

## 2018-05-13 NOTE — Procedures (Addendum)
Emma Page 14-May-1986 7460w0d  Fetus A Non-Stress Test Interpretation for 05/13/18  Indication: Chronic Hypertenstion  Fetal Heart Rate A Mode: External Baseline Rate (A): 155 bpm Variability: Moderate Accelerations: 10 x 10 Decelerations: Variable Multiple birth?: Yes  Uterine Activity Mode: Palpation, Toco Contraction Frequency (min): none  Interpretation (Fetal Testing) Nonstress Test Interpretation: Reactive Comments: FHR tracing rev'd by Dr. Sedonia SmallShankar [name] 14-May-1986 160w0d   Fetus B Non-Stress Test Interpretation for 05/13/18  Indication: Chronic Hypertenstion  Fetal Heart Rate Fetus B Mode: External Baseline Rate (B): 140 BPM Variability: Moderate Accelerations: 10 x 10 Decelerations: None  Uterine Activity Mode: Palpation, Toco Contraction Frequency (min): none  Interpretation (Baby B - Fetal Testing) Nonstress Test Interpretation (Baby B): Reactive Comments (Baby B): FHR tracing rev'd by Dr. Judeth CornfieldShankar  Dr. Noralee Spaceavi Aloys Hupfer I reviewed the NST. Both fetuses are reactive (10x10). [facility]. [facility]. She understands the risk of fetal demise.

## 2018-05-16 ENCOUNTER — Encounter (HOSPITAL_COMMUNITY): Payer: Self-pay

## 2018-05-16 ENCOUNTER — Ambulatory Visit (HOSPITAL_COMMUNITY)
Admission: RE | Admit: 2018-05-16 | Discharge: 2018-05-16 | Disposition: A | Discharge status: Home / Self Care | Payer: Medicaid Other | Source: Ambulatory Visit | Attending: Obstetrics | Admitting: Obstetrics

## 2018-05-16 ENCOUNTER — Other Ambulatory Visit (HOSPITAL_COMMUNITY): Payer: Self-pay | Admitting: Obstetrics and Gynecology

## 2018-05-16 DIAGNOSIS — O365932 Maternal care for other known or suspected poor fetal growth, third trimester, fetus 2: Secondary | ICD-10-CM

## 2018-05-16 DIAGNOSIS — Z6791 Unspecified blood type, Rh negative: Secondary | ICD-10-CM

## 2018-05-16 DIAGNOSIS — O30049 Twin pregnancy, dichorionic/diamniotic, unspecified trimester: Secondary | ICD-10-CM

## 2018-05-16 DIAGNOSIS — O0940 Supervision of pregnancy with grand multiparity, unspecified trimester: Secondary | ICD-10-CM | POA: Diagnosis not present

## 2018-05-16 DIAGNOSIS — O99213 Obesity complicating pregnancy, third trimester: Secondary | ICD-10-CM

## 2018-05-16 DIAGNOSIS — O360130 Maternal care for anti-D [Rh] antibodies, third trimester, not applicable or unspecified: Secondary | ICD-10-CM

## 2018-05-16 DIAGNOSIS — O10013 Pre-existing essential hypertension complicating pregnancy, third trimester: Secondary | ICD-10-CM

## 2018-05-16 DIAGNOSIS — O0943 Supervision of pregnancy with grand multiparity, third trimester: Secondary | ICD-10-CM

## 2018-05-16 DIAGNOSIS — O321XX2 Maternal care for breech presentation, fetus 2: Secondary | ICD-10-CM | POA: Insufficient documentation

## 2018-05-16 DIAGNOSIS — O283 Abnormal ultrasonic finding on antenatal screening of mother: Secondary | ICD-10-CM

## 2018-05-16 DIAGNOSIS — E669 Obesity, unspecified: Secondary | ICD-10-CM | POA: Diagnosis not present

## 2018-05-16 DIAGNOSIS — Z3A30 30 weeks gestation of pregnancy: Secondary | ICD-10-CM

## 2018-05-16 DIAGNOSIS — O10919 Unspecified pre-existing hypertension complicating pregnancy, unspecified trimester: Secondary | ICD-10-CM

## 2018-05-16 DIAGNOSIS — O26891 Other specified pregnancy related conditions, first trimester: Secondary | ICD-10-CM

## 2018-05-16 DIAGNOSIS — O30043 Twin pregnancy, dichorionic/diamniotic, third trimester: Secondary | ICD-10-CM | POA: Diagnosis present

## 2018-05-16 NOTE — Procedures (Signed)
Emma Page 1986-05-05 3265w3d  Fetus A Non-Stress Test Interpretation for 05/16/18  Indication: BPP 8/8 X A&B, Di/Di twinsBrittany Page 1986-05-05 6465w3d   Fetus B Non-Stress Test Interpretation for 05/16/18  Indication: BPP 8/8, Di/Di twins  Fetal Heart Rate Fetus B Mode: External Baseline Rate (B): 145 BPM Variability: Moderate Accelerations: 10 x 10 Decelerations: None  Uterine Activity Mode: Palpation, Toco Contraction Frequency (min): occas./erratic UI Contraction Duration (sec): 10-50 Contraction Quality: Mild Resting Tone Palpated: Relaxed Resting Time: Adequate  Interpretation (Baby B - Fetal Testing) Comments (Baby B): Reassurring for gestational age, 4210X10's. Reviewed by Dr. Judeth CornfieldShankar. Patient difficult to monitor continuously.    Fetal Heart Rate A Mode: External Baseline Rate (A): 150 bpm Variability: Moderate Accelerations: 10 x 10 Decelerations: Variable Multiple birth?: Yes  Uterine Activity Mode: Palpation, Toco Contraction Frequency (min): occas./erratic UI Contraction Duration (sec): 10-50 Contraction Quality: Mild Resting Tone Palpated: Relaxed Resting Time: Adequate  Interpretation (Fetal Testing) Overall Impression: Reassuring for gestational age Comments: EFM tracing reviewed by Dr. Judeth CornfieldShankar

## 2018-05-19 ENCOUNTER — Ambulatory Visit (INDEPENDENT_AMBULATORY_CARE_PROVIDER_SITE_OTHER): Payer: Medicaid Other | Admitting: Obstetrics and Gynecology

## 2018-05-19 ENCOUNTER — Other Ambulatory Visit (HOSPITAL_COMMUNITY): Payer: Self-pay | Admitting: Obstetrics and Gynecology

## 2018-05-19 ENCOUNTER — Ambulatory Visit (HOSPITAL_COMMUNITY)
Admission: RE | Admit: 2018-05-19 | Discharge: 2018-05-19 | Disposition: A | Discharge status: Home / Self Care | Payer: Medicaid Other | Source: Ambulatory Visit | Attending: Obstetrics and Gynecology | Admitting: Obstetrics and Gynecology

## 2018-05-19 ENCOUNTER — Ambulatory Visit (HOSPITAL_BASED_OUTPATIENT_CLINIC_OR_DEPARTMENT_OTHER)
Admission: RE | Admit: 2018-05-19 | Discharge: 2018-05-19 | Disposition: A | Discharge status: Home / Self Care | Payer: Medicaid Other | Source: Ambulatory Visit | Attending: Obstetrics and Gynecology | Admitting: Obstetrics and Gynecology

## 2018-05-19 ENCOUNTER — Encounter: Payer: Self-pay | Admitting: Obstetrics and Gynecology

## 2018-05-19 ENCOUNTER — Encounter (HOSPITAL_COMMUNITY): Payer: Self-pay

## 2018-05-19 VITALS — BP 123/88 | HR 99 | Wt 223.0 lb

## 2018-05-19 DIAGNOSIS — O30049 Twin pregnancy, dichorionic/diamniotic, unspecified trimester: Secondary | ICD-10-CM

## 2018-05-19 DIAGNOSIS — O10013 Pre-existing essential hypertension complicating pregnancy, third trimester: Secondary | ICD-10-CM | POA: Insufficient documentation

## 2018-05-19 DIAGNOSIS — O26891 Other specified pregnancy related conditions, first trimester: Secondary | ICD-10-CM

## 2018-05-19 DIAGNOSIS — O30043 Twin pregnancy, dichorionic/diamniotic, third trimester: Secondary | ICD-10-CM | POA: Insufficient documentation

## 2018-05-19 DIAGNOSIS — O360930 Maternal care for other rhesus isoimmunization, third trimester, not applicable or unspecified: Secondary | ICD-10-CM | POA: Diagnosis not present

## 2018-05-19 DIAGNOSIS — O283 Abnormal ultrasonic finding on antenatal screening of mother: Secondary | ICD-10-CM

## 2018-05-19 DIAGNOSIS — Z6791 Unspecified blood type, Rh negative: Secondary | ICD-10-CM

## 2018-05-19 DIAGNOSIS — O10919 Unspecified pre-existing hypertension complicating pregnancy, unspecified trimester: Secondary | ICD-10-CM

## 2018-05-19 DIAGNOSIS — O0943 Supervision of pregnancy with grand multiparity, third trimester: Secondary | ICD-10-CM | POA: Diagnosis not present

## 2018-05-19 DIAGNOSIS — O30009 Twin pregnancy, unspecified number of placenta and unspecified number of amniotic sacs, unspecified trimester: Secondary | ICD-10-CM

## 2018-05-19 DIAGNOSIS — O10913 Unspecified pre-existing hypertension complicating pregnancy, third trimester: Secondary | ICD-10-CM

## 2018-05-19 DIAGNOSIS — O365932 Maternal care for other known or suspected poor fetal growth, third trimester, fetus 2: Secondary | ICD-10-CM

## 2018-05-19 DIAGNOSIS — Z3A30 30 weeks gestation of pregnancy: Secondary | ICD-10-CM | POA: Insufficient documentation

## 2018-05-19 DIAGNOSIS — Z283 Underimmunization status: Secondary | ICD-10-CM

## 2018-05-19 DIAGNOSIS — O0990 Supervision of high risk pregnancy, unspecified, unspecified trimester: Secondary | ICD-10-CM

## 2018-05-19 DIAGNOSIS — O09899 Supervision of other high risk pregnancies, unspecified trimester: Secondary | ICD-10-CM

## 2018-05-19 DIAGNOSIS — O0993 Supervision of high risk pregnancy, unspecified, third trimester: Secondary | ICD-10-CM

## 2018-05-19 DIAGNOSIS — O9989 Other specified diseases and conditions complicating pregnancy, childbirth and the puerperium: Secondary | ICD-10-CM

## 2018-05-19 MED ORDER — RHO D IMMUNE GLOBULIN 1500 UNIT/2ML IJ SOSY
300.0000 ug | PREFILLED_SYRINGE | Freq: Once | INTRAMUSCULAR | Status: AC
Start: 2018-05-19 — End: 2018-05-19
  Administered 2018-05-19: 300 ug via INTRAMUSCULAR

## 2018-05-19 NOTE — Progress Notes (Signed)
   PRENATAL VISIT NOTE  Subjective:  Emma Page is a 32 y.o. [id number] at 5958w6d being seen today for ongoing prenatal care.  She is currently monitored for the following issues for this high-risk pregnancy and has Rh negative state in antepartum period, first trimester; Dichorionic diamniotic twin pregnancy, antepartum; Chronic hypertension during pregnancy, antepartum; Supervision of high risk pregnancy, antepartum; Grand multiparity; History of asthma; Rubella non-immune status, antepartum; Vitamin D deficiency; Anti-D antibodies present during pregnancy; Anemia in pregnancy; and Abnormal fetal ultrasound on their problem list.  Patient reports no complaints.  Contractions: Not present. Vag. Bleeding: None.  Movement: Present. Denies leaking of fluid.   The following portions of the patient's history were reviewed and updated as appropriate: allergies, current medications, past family history, past medical history, past social history, past surgical history and problem list. Problem list updated.  Objective:   Vitals:   05/19/18 1020  BP: 123/88  Pulse: 99  Weight: 223 lb (101.2 kg)    Fetal Status: Fetal Heart Rate (bpm): 154 A/ 156 B   Movement: Present     General:  Alert, oriented and cooperative. Patient is in no acute distress.  Skin: Skin is warm and dry. No rash noted.   Cardiovascular: Normal heart rate noted  Respiratory: Normal respiratory effort, no problems with respiration noted  Abdomen: Soft, gravid, appropriate for gestational age.  Pain/Pressure: Absent     Pelvic: Cervical exam deferred        Extremities: Normal range of motion.  Edema: Trace  Mental Status: Normal mood and affect. Normal behavior. Normal judgment and thought content.   Assessment and Plan:  Pregnancy: [id number] at 1258w6d  1. Supervision of high risk pregnancy, antepartum Patient is doing well without complaints   2. Dichorionic diamniotic twin pregnancy, antepartum Follow up  growth today Discussed possible admission given abnormal dopplers in 3rd trimester  3. Chronic hypertension during pregnancy, antepartum Normotensive Continue ASA and labetalol  4. Rh negative state in antepartum period, first trimester Rhogam today  5. Rubella non-immune status, antepartum Will offer pp  6. Abnormal fetal ultrasound Abnormal dopplers in twin B Follow up ultrasound today S/p BMZ 6/28-6/29  Preterm labor symptoms and general obstetric precautions including but not limited to vaginal bleeding, contractions, leaking of fluid and fetal movement were reviewed in detail with the patient. Please refer to After Visit Summary for other counseling recommendations.  No follow-ups on file.  Future Appointments  Date Time Provider Department Center  05/19/2018 11:00 AM [facility] US 3 [facility]    Catalina AntiguaPeggy Matisha Termine, MD

## 2018-05-19 NOTE — ED Notes (Signed)
Dr. Judeth CornfieldShankar advises that pt be admitted for continuous monitoring. Patient has previously declined admission, but now agrees to come in tomorrow for direct admit to 3rd floor. Patient states she will be here around 9:00-9:30. Dr. Erin FullingHarraway-Smith received report and will be the admitting MD. Report also given to [facility], RN, charge nurse on 3rd floor.

## 2018-05-19 NOTE — Progress Notes (Signed)
Pt is G16P3 here for ROB visit, twin pregnancy. Pt has no complaints. Rhogam given today, pt tolerated well.

## 2018-05-19 NOTE — Procedures (Signed)
GrenadaBrittany Merritts 06/23/1986 3136w6d  Fetus A Non-Stress Test Interpretation for 05/19/18  Indication: Di/Di twins, Baby B w/IUGR and abn dopplersBrittany Carreno 06/23/1986 3736w6d   Fetus B Non-Stress Test Interpretation for 05/19/18  Indication: Di/Di twins, Baby B w/IUGR and abn dopplers  Fetal Heart Rate Fetus B Mode: External Baseline Rate (B): 140 BPM Variability: Moderate Accelerations: 10 x 10 Decelerations: None  Uterine Activity Mode: Palpation, Toco Contraction Frequency (min): UI Contraction Duration (sec): 10-40 Contraction Quality: Mild Resting Tone Palpated: Relaxed Resting Time: Adequate  Interpretation (Baby B - Fetal Testing) Comments (Baby B): Reassuring for gestational age, 1610X10's EFM tracing reviewed by Dr. Judeth CornfieldShankar    Fetal Heart Rate A Mode: External Baseline Rate (A): 150 bpm Variability: Moderate Accelerations: 10 x 10, 15 x 15 Decelerations: None Multiple birth?: Yes  Uterine Activity Mode: Palpation, Toco Contraction Frequency (min): UI Contraction Duration (sec): 10-40 Contraction Quality: Mild Resting Tone Palpated: Relaxed Resting Time: Adequate  Interpretation (Fetal Testing) Nonstress Test Interpretation: Reactive Overall Impression: Reassuring for gestational age Comments: EFM tracing reviewed by Dr. Judeth CornfieldShankar

## 2018-05-20 ENCOUNTER — Encounter (HOSPITAL_COMMUNITY): Payer: Self-pay

## 2018-05-20 ENCOUNTER — Other Ambulatory Visit: Payer: Self-pay

## 2018-05-20 ENCOUNTER — Inpatient Hospital Stay (HOSPITAL_COMMUNITY)
Admission: AD | Admit: 2018-05-20 | Discharge: 2018-05-30 | DRG: 787 | Disposition: A | Discharge status: Home / Self Care | Payer: Medicaid Other | Attending: Obstetrics & Gynecology | Admitting: Obstetrics & Gynecology

## 2018-05-20 DIAGNOSIS — O0943 Supervision of pregnancy with grand multiparity, third trimester: Secondary | ICD-10-CM | POA: Diagnosis not present

## 2018-05-20 DIAGNOSIS — O99824 Streptococcus B carrier state complicating childbirth: Secondary | ICD-10-CM | POA: Diagnosis present

## 2018-05-20 DIAGNOSIS — Z7982 Long term (current) use of aspirin: Secondary | ICD-10-CM | POA: Diagnosis not present

## 2018-05-20 DIAGNOSIS — O365992 Maternal care for other known or suspected poor fetal growth, unspecified trimester, fetus 2: Secondary | ICD-10-CM

## 2018-05-20 DIAGNOSIS — O10013 Pre-existing essential hypertension complicating pregnancy, third trimester: Secondary | ICD-10-CM | POA: Diagnosis not present

## 2018-05-20 DIAGNOSIS — O30043 Twin pregnancy, dichorionic/diamniotic, third trimester: Secondary | ICD-10-CM

## 2018-05-20 DIAGNOSIS — Z9289 Personal history of other medical treatment: Secondary | ICD-10-CM

## 2018-05-20 DIAGNOSIS — O10919 Unspecified pre-existing hypertension complicating pregnancy, unspecified trimester: Secondary | ICD-10-CM | POA: Diagnosis present

## 2018-05-20 DIAGNOSIS — Z3A31 31 weeks gestation of pregnancy: Secondary | ICD-10-CM

## 2018-05-20 DIAGNOSIS — Z641 Problems related to multiparity: Secondary | ICD-10-CM

## 2018-05-20 DIAGNOSIS — O26891 Other specified pregnancy related conditions, first trimester: Secondary | ICD-10-CM

## 2018-05-20 DIAGNOSIS — O26893 Other specified pregnancy related conditions, third trimester: Secondary | ICD-10-CM | POA: Diagnosis present

## 2018-05-20 DIAGNOSIS — D649 Anemia, unspecified: Secondary | ICD-10-CM | POA: Diagnosis present

## 2018-05-20 DIAGNOSIS — Z6791 Unspecified blood type, Rh negative: Secondary | ICD-10-CM

## 2018-05-20 DIAGNOSIS — O30009 Twin pregnancy, unspecified number of placenta and unspecified number of amniotic sacs, unspecified trimester: Secondary | ICD-10-CM

## 2018-05-20 DIAGNOSIS — O09899 Supervision of other high risk pregnancies, unspecified trimester: Secondary | ICD-10-CM

## 2018-05-20 DIAGNOSIS — O365932 Maternal care for other known or suspected poor fetal growth, third trimester, fetus 2: Secondary | ICD-10-CM | POA: Diagnosis present

## 2018-05-20 DIAGNOSIS — O360130 Maternal care for anti-D [Rh] antibodies, third trimester, not applicable or unspecified: Secondary | ICD-10-CM | POA: Diagnosis not present

## 2018-05-20 DIAGNOSIS — O119 Pre-existing hypertension with pre-eclampsia, unspecified trimester: Secondary | ICD-10-CM | POA: Diagnosis present

## 2018-05-20 DIAGNOSIS — O365933 Maternal care for other known or suspected poor fetal growth, third trimester, fetus 3: Secondary | ICD-10-CM | POA: Diagnosis not present

## 2018-05-20 DIAGNOSIS — O0990 Supervision of high risk pregnancy, unspecified, unspecified trimester: Secondary | ICD-10-CM

## 2018-05-20 DIAGNOSIS — O1002 Pre-existing essential hypertension complicating childbirth: Secondary | ICD-10-CM | POA: Diagnosis present

## 2018-05-20 DIAGNOSIS — Z283 Underimmunization status: Secondary | ICD-10-CM

## 2018-05-20 DIAGNOSIS — Z3A32 32 weeks gestation of pregnancy: Secondary | ICD-10-CM | POA: Diagnosis not present

## 2018-05-20 DIAGNOSIS — O99213 Obesity complicating pregnancy, third trimester: Secondary | ICD-10-CM | POA: Diagnosis not present

## 2018-05-20 DIAGNOSIS — O283 Abnormal ultrasonic finding on antenatal screening of mother: Secondary | ICD-10-CM

## 2018-05-20 DIAGNOSIS — O9902 Anemia complicating childbirth: Secondary | ICD-10-CM | POA: Diagnosis present

## 2018-05-20 DIAGNOSIS — O30049 Twin pregnancy, dichorionic/diamniotic, unspecified trimester: Secondary | ICD-10-CM

## 2018-05-20 DIAGNOSIS — O99820 Streptococcus B carrier state complicating pregnancy: Secondary | ICD-10-CM

## 2018-05-20 DIAGNOSIS — O9989 Other specified diseases and conditions complicating pregnancy, childbirth and the puerperium: Secondary | ICD-10-CM

## 2018-05-20 LAB — CBC
HCT: 28.1 % — ABNORMAL LOW (ref 36.0–46.0)
HEMOGLOBIN: 10.3 g/dL — AB (ref 12.0–15.0)
MCH: 31.9 pg (ref 26.0–34.0)
MCHC: 36.7 g/dL — AB (ref 30.0–36.0)
MCV: 87 fL (ref 78.0–100.0)
Platelets: 334 10*3/uL (ref 150–400)
RBC: 3.23 MIL/uL — AB (ref 3.87–5.11)
RDW: 13.3 % (ref 11.5–15.5)
WBC: 11.3 10*3/uL — ABNORMAL HIGH (ref 4.0–10.5)

## 2018-05-20 MED ORDER — ASPIRIN EC 81 MG PO TBEC
81.0000 mg | DELAYED_RELEASE_TABLET | Freq: Every day | ORAL | Status: DC
  Administered 2018-05-21 – 2018-05-26 (×6): 81 mg via ORAL
  Filled 2018-05-20 (×9): qty 1

## 2018-05-20 MED ORDER — LABETALOL HCL 200 MG PO TABS
200.0000 mg | ORAL_TABLET | Freq: Two times a day (BID) | ORAL | Status: DC
  Administered 2018-05-20 – 2018-05-30 (×19): 200 mg via ORAL
  Filled 2018-05-20 (×19): qty 1
  Filled 2018-05-20: qty 2
  Filled 2018-05-20: qty 1

## 2018-05-20 MED ORDER — PRENATAL MULTIVITAMIN CH
1.0000 | ORAL_TABLET | Freq: Every day | ORAL | Status: DC
  Administered 2018-05-20 – 2018-05-26 (×7): 1 via ORAL
  Filled 2018-05-20 (×6): qty 1

## 2018-05-20 MED ORDER — CALCIUM CARBONATE ANTACID 500 MG PO CHEW: 2.0000 | CHEWABLE_TABLET | ORAL | Status: DC | PRN

## 2018-05-20 MED ORDER — DOCUSATE SODIUM 100 MG PO CAPS
100.0000 mg | ORAL_CAPSULE | Freq: Every day | ORAL | Status: DC
  Administered 2018-05-20: 100 mg via ORAL
  Filled 2018-05-20: qty 1

## 2018-05-20 MED ORDER — SODIUM CHLORIDE 0.9 % IV SOLN: 250.0000 mL | INTRAVENOUS | Status: DC | PRN

## 2018-05-20 MED ORDER — BETAMETHASONE SOD PHOS & ACET 6 (3-3) MG/ML IJ SUSP
12.0000 mg | INTRAMUSCULAR | Status: AC
  Administered 2018-05-20 – 2018-05-21 (×2): 12 mg via INTRAMUSCULAR
  Filled 2018-05-20 (×2): qty 2

## 2018-05-20 MED ORDER — SODIUM CHLORIDE 0.9% FLUSH: 3.0000 mL | Freq: Two times a day (BID) | INTRAVENOUS | Status: DC

## 2018-05-20 MED ORDER — ACETAMINOPHEN 325 MG PO TABS
650.0000 mg | ORAL_TABLET | ORAL | Status: DC | PRN
  Administered 2018-05-20 – 2018-05-24 (×2): 650 mg via ORAL
  Filled 2018-05-20 (×2): qty 2

## 2018-05-20 MED ORDER — ZOLPIDEM TARTRATE 5 MG PO TABS
5.0000 mg | ORAL_TABLET | Freq: Every evening | ORAL | Status: DC | PRN
  Filled 2018-05-20: qty 1

## 2018-05-20 MED ORDER — SODIUM CHLORIDE 0.9% FLUSH: 3.0000 mL | INTRAVENOUS | Status: DC | PRN

## 2018-05-20 NOTE — H&P (Signed)
Obstetric History and Physical  Emma Page is a 32 y.o. [id number] with di/di twins at 5816w0d presenting for in patient management for reverse end diastolic flow in Twin B. Patient denies leaking/bleeding. Reports occasional mild contractions. Reports normal fetal movement. Pregnancy complicated by cHTN on labetalol, well controlled  Prenatal Course Source of Care: [place] with onset of care at 11 weeks Pregnancy complications or risks: Patient Active Problem List   Diagnosis Date Noted  . Abnormal fetal ultrasound 04/01/2018  . Rubella non-immune status, antepartum 01/11/2018  . Vitamin D deficiency 01/11/2018  . Anti-D antibodies present during pregnancy 01/11/2018  . Anemia in pregnancy 01/11/2018  . Supervision of high risk pregnancy, antepartum 01/03/2018  . Grand multiparity 01/03/2018  . History of asthma 01/03/2018  . Rh negative state in antepartum period, first trimester 12/14/2017  . Dichorionic diamniotic twin pregnancy, antepartum 12/14/2017  . Chronic hypertension during pregnancy, antepartum 12/14/2017   She plans to breastfeed She is undecided for contraception.   Prenatal labs and studies: ABO, Rh: --/--/A NEG (06/28 1726) Antibody: Negative (07/25 0940) Rubella: 0.99 (04/01 1111) RPR: Non Reactive (07/25 0940)  HBsAg: Negative (04/01 1111)  HIV: Non Reactive (07/25 0940)  GBS:  2 hr GTT:  77/113/ Genetic screening normal Anatomy US normal  Medical History:  Past Medical History:  Diagnosis Date  . Asthma   . Chlamydia   . Gonorrhea   . Hypertension   . Infection    UTI  . Pregnancy induced hypertension   . Trichomonas infection   . Vaginal Pap smear, abnormal    bx x3    Past Surgical History:  Procedure Laterality Date  . abortion    . DILATION AND CURETTAGE OF UTERUS    . DILATION AND EVACUATION N/A 08/19/2017   Procedure: DILATATION AND EVACUATION;  Surgeon: Willodean RosenthalHarraway-Smith, Carolyn, MD;  Location: [facility];  Service: Gynecology;   Laterality: N/A;  . EYE SURGERY Right    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    OB History  Gravida Para Term Preterm AB Living  16 3 3  0 12 3  SAB TAB Ectopic Multiple Live Births  7 5 0 0 3    # Outcome Date GA Lbr Len/2nd Weight Sex Delivery Anes PTL Lv  16 Current           15 Term 07/04/05    F Vag-Spont   LIV  14 Term 11/09/02    M Vag-Spont   LIV  13 Term 12/24/00    F Vag-Spont   LIV  12 SAB           11 SAB           10 SAB           9 SAB           8 TAB           7 TAB           6 TAB           5 TAB           4 TAB           3 SAB           2 SAB           1 SAB             Social History   Socioeconomic History  . Marital status: Single    Spouse name: Not on file  .  Number of children: Not on file  . Years of education: Not on file  . Highest education level: Not on file  Occupational History  . Not on file  Social Needs  . Financial resource strain: Not on file  . Food insecurity:    Worry: Not on file    Inability: Not on file  . Transportation needs:    Medical: Not on file    Non-medical: Not on file  Tobacco Use  . Smoking status: Never Smoker  . Smokeless tobacco: Never Used  Substance and Sexual Activity  . Alcohol use: No    Comment: occasional-socially, 3-4 week; not while pregnant  . Drug use: No  . Sexual activity: Yes    Birth control/protection: None  Lifestyle  . Physical activity:    Days per week: Not on file    Minutes per session: Not on file  . Stress: Not on file  Relationships  . Social connections:    Talks on phone: Not on file    Gets together: Not on file    Attends religious service: Not on file    Active member of club or organization: Not on file    Attends meetings of clubs or organizations: Not on file    Relationship status: Not on file  Other Topics Concern  . Not on file  Social History Narrative  . Not on file    Family History  Problem Relation Age of Onset  . Hypertension Father   . Cancer  Maternal Grandmother   . Heart disease Maternal Grandmother   . Cancer Paternal Grandmother   . Diabetes Paternal Grandfather   . Anesthesia problems Neg Hx     Medications Prior to Admission  Medication Sig Dispense Refill Last Dose  . aspirin 81 MG chewable tablet Chew 1 tablet (81 mg total) by mouth daily. 30 tablet 12 05/20/2018 at Unknown time  . Doxylamine-Pyridoxine (DICLEGIS) 10-10 MG TBEC Take 1 tablet with breakfast and lunch.  Take 2 tablets at bedtime. 100 tablet 4 Past Week at Unknown time  . labetalol (NORMODYNE) 200 MG tablet Take 1 tablet (200 mg total) by mouth 2 (two) times daily. 60 tablet 3 05/20/2018 at 1000  . Prenatal-Fe Fum-Methf-FA w/o A (VITAFOL-NANO) 18-0.6-0.4 MG TABS Take 1 tablet by mouth daily. 30 tablet 12 05/19/2018 at Unknown time  . PROAIR HFA 108 (90 Base) MCG/ACT inhaler INHALE 2 PUFFS INTO THE LUNGS EVERY 6 HOURS AS NEEDED FOR WHEEZING OR SHORTNESS OF BREATH 8.5 g 5 Past Week at Unknown time    No Known Allergies  Review of Systems: Negative except for what is mentioned in HPI.  Physical Exam: BP (!) 150/114 (BP Location: Left Arm)   Pulse (!) 117   Resp 18   Ht 5\' 5"  (1.651 m)   Wt 101.2 kg   LMP 10/15/2017 (Exact Date)   SpO2 100%   BMI 37.13 kg/m  CONSTITUTIONAL: Well-developed, well-nourished female in no acute distress.  HENT:  Normocephalic, atraumatic, External right and left ear normal. Oropharynx is clear and moist EYES: Conjunctivae and EOM are normal. Pupils are equal, round, and reactive to light. No scleral icterus.  NECK: Normal range of motion, supple, no masses SKIN: Skin is warm and dry. No rash noted. Not diaphoretic. No erythema. No pallor. NEUROLOGIC: Alert and oriented to person, place, and time. Normal reflexes, muscle tone coordination. No cranial nerve deficit noted. PSYCHIATRIC: Normal mood and affect. Normal behavior. Normal judgment and thought content. CARDIOVASCULAR: Normal heart  rate noted, regular  rhythm RESPIRATORY: Effort and breath sounds normal, no problems with respiration noted ABDOMEN: Soft, nontender, nondistended, gravid. MUSCULOSKELETAL: Normal range of motion. No edema and no tenderness. 2+ distal pulses.  Cervical Exam: deferred Presentation: cephalic/cephalic FHT A:  Baseline rate 150 bpm   Variability moderate  Accelerations present   Decelerations none FHT B:  Baseline rate 150 bpm   Variability moderate  Accelerations present   Decelerations none Contractions: none   Pertinent Labs/Studies:   No results found for this or any previous visit (from the past 24 hour(s)).  Assessment : Emma GalasBrittany Page is a 32 y.o. [id number] at 5129w0d being admitted for inpatient management for intermittent reverse EDF in Twin B. Plan for delivery at 32 weeks unless worsening dopplers.   Patient would prefer vaginal delivery if possible, I reviewed that as long as twins tolerate, we would proceed with induction. She is aware that if either infant does not tolerate delivery, would recommend primary c-section. Reviewed that if presenting Twin is not cephalic would proceed with c-section. Reviewed reviewed risks of breech extraction if second twin flips, risk of head entrapment and injury, she verbalizes understanding and would like to proceed with vaginal delivery of second twin if breech.   Plan: Repeat dopplers/BPP Monday Rescue course of steroids 8/16-17 NICU aware Daily NST Cont home labetalol Plan for IOL at 32 weeks  K. Therese SarahMeryl Davis, M.D. [facility]  05/20/2018, 11:58 AM

## 2018-05-20 NOTE — Plan of Care (Signed)

## 2018-05-21 LAB — WET PREP, GENITAL
Clue Cells Wet Prep HPF POC: NONE SEEN
Sperm: NONE SEEN
Trich, Wet Prep: NONE SEEN
Yeast Wet Prep HPF POC: NONE SEEN

## 2018-05-21 MED ORDER — DOCUSATE SODIUM 100 MG PO CAPS: 100.0000 mg | ORAL_CAPSULE | Freq: Two times a day (BID) | ORAL | Status: DC | PRN

## 2018-05-21 NOTE — Progress Notes (Signed)
Daily Antepartum Note  Admission Date: 05/20/2018 Current Date: 05/21/2018 8:46 AM  Emma Page is a 32 y.o. [id number] @ 2616w1d, HD#2, admitted for scheduled admission for fetus B with REDF/AEDF.  Pregnancy complicated by: Patient Active Problem List   Diagnosis Date Noted  . Abnormal fetal ultrasound 04/01/2018  . Rubella non-immune status, antepartum 01/11/2018  . Vitamin D deficiency 01/11/2018  . Anemia in pregnancy 01/11/2018  . Supervision of high risk pregnancy, antepartum 01/03/2018  . Grand multiparity 01/03/2018  . History of asthma 01/03/2018  . Rh negative state in antepartum period, first trimester 12/14/2017  . Dichorionic diamniotic twin pregnancy, antepartum 12/14/2017  . Chronic hypertension during pregnancy, antepartum 12/14/2017    Overnight/24hr events:  none  Subjective:  No s/s of pre-eclampsia, PTL or decreased FM  Objective:    Current Vital Signs 24h Vital Sign Ranges  T (patient is sleep) Temp  Avg: 98.1 F (36.7 C)  Min: 97.7 F (36.5 C)  Max: 98.3 F (36.8 C)  BP 118/80 BP  Min: 118/80  Max: 150/114  HR 95 Pulse  Avg: 99.2  Min: 92  Max: 117  RR 17 Resp  Avg: 17.6  Min: 17  Max: 18  SaO2 100 % Room Air SpO2  Avg: 100 %  Min: 100 %  Max: 100 %       24 Hour I/O Current Shift I/O  Time Ins Outs No intake/output data recorded. No intake/output data recorded.   Patient Vitals for the past 24 hrs:  BP Temp Temp src Pulse Resp SpO2 Height Weight  05/20/18 2334 118/80 - Oral 95 17 100 % - -  05/20/18 1932 (!) 143/98 98.3 F (36.8 C) Oral 92 18 100 % - -  05/20/18 1619 (!) 143/96 98.3 F (36.8 C) Oral 95 17 100 % - -  05/20/18 1215 (!) 143/103 97.7 F (36.5 C) Oral 97 18 100 % - -  05/20/18 1056 - - - - - - 5\' 5"  (1.651 m) 101.2 kg  05/20/18 1039 - - - - - - 5\' 5"  (1.651 m) 101.2 kg  05/20/18 1038 (!) 150/114 - Oral (!) 117 18 100 % - -   145 baseline, +accels, no decel, mod variabilty 140 baseline, +accels, no decel, mod  variability Toco: +irritability  Physical exam: General: Well nourished, well developed female in no acute distress. Abdomen: gravid nttp Cardiovascular: S1, S2 normal, no murmur, rub or gallop, regular rate and rhythm Respiratory: CTAB Extremities: no clubbing, cyanosis or edema Skin: Warm and dry.   Medications: Current Facility-Administered Medications  Medication Dose Route Frequency Provider Last Rate Last Dose  . 0.9 %  sodium chloride infusion  250 mL Intravenous PRN Conan Bowensavis, Kelly M, MD      . acetaminophen (TYLENOL) tablet 650 mg  650 mg Oral Q4H PRN Conan Bowensavis, Kelly M, MD   650 mg at 05/20/18 2335  . aspirin EC tablet 81 mg  81 mg Oral Daily Conan Bowensavis, Kelly M, MD      . betamethasone acetate-betamethasone sodium phosphate (CELESTONE) injection 12 mg  12 mg Intramuscular Q24 Hr x 2 Conan Bowensavis, Kelly M, MD   12 mg at 05/20/18 1238  . calcium carbonate (TUMS - dosed in mg elemental calcium) chewable tablet 400 mg of elemental calcium  2 tablet Oral Q4H PRN Conan Bowensavis, Kelly M, MD      . docusate sodium (COLACE) capsule 100 mg  100 mg Oral BID PRN [place] BingPickens, Dequann Vandervelden, MD      . labetalol (NORMODYNE)  tablet 200 mg  200 mg Oral BID Conan Bowensavis, Kelly M, MD   200 mg at 05/20/18 2013  . prenatal multivitamin tablet 1 tablet  1 tablet Oral Q1200 Conan Bowensavis, Kelly M, MD   1 tablet at 05/20/18 1203  . sodium chloride flush (NS) 0.9 % injection 3 mL  3 mL Intravenous Q12H Conan Bowensavis, Kelly M, MD      . sodium chloride flush (NS) 0.9 % injection 3 mL  3 mL Intravenous PRN Conan Bowensavis, Kelly M, MD      . zolpidem [facility](AMBIEN) tablet 5 mg  5 mg Oral QHS PRN Conan Bowensavis, Kelly M, MD        Labs:  Recent Labs  Lab 05/20/18 1255  WBC 11.3*  HGB 10.3*  HCT 28.1*  PLT 334   Radiology: no new imaging 8/15 A: ceph, MVP 5.6, 1674gm, efw 57%, AC 32%, normal UA S/D dopplers, discordance 34% B: ceph, MVP 3, <10% 1107gm, AC <3%, REDF  Assessment & Plan:  Pt stable *Pregnancy: routine care. BID NSTs. Patient declines BTL.  *Di-Di twins:  desires VD. D/w her that will need to at least have presenting be cephalic and B may not tolerate labor; not a candidate for breech extraction. Consider CST if for trial of VD *Fetus B FGR: repeat studies on 8/22 *Preterm: s/p NICU consult on prior admission. NICU aware, can repeat consult PRN. -Rescue bmz #2 dose 1230pm today. Initial course on 6/28 and 6/29 *cHTN: continue labetalol bid *PPx: SCDs, OOB ad lib *FEN/GI:regular diet *Dispo: trying to get to 32wk deliver  Emma Page, [name] Attending Center for [facility](Faculty Practice)

## 2018-05-22 DIAGNOSIS — O365932 Maternal care for other known or suspected poor fetal growth, third trimester, fetus 2: Principal | ICD-10-CM

## 2018-05-22 LAB — PROTEIN / CREATININE RATIO, URINE
CREATININE, URINE: 52 mg/dL
Protein Creatinine Ratio: 0.15 mg/mg{Cre} (ref 0.00–0.15)
TOTAL PROTEIN, URINE: 8 mg/dL

## 2018-05-22 LAB — COMPREHENSIVE METABOLIC PANEL
ALT: 20 U/L (ref 0–44)
AST: 21 U/L (ref 15–41)
Albumin: 3.1 g/dL — ABNORMAL LOW (ref 3.5–5.0)
Alkaline Phosphatase: 87 U/L (ref 38–126)
Anion gap: 12 (ref 5–15)
BILIRUBIN TOTAL: 0.2 mg/dL — AB (ref 0.3–1.2)
BUN: 11 mg/dL (ref 6–20)
CO2: 20 mmol/L — ABNORMAL LOW (ref 22–32)
Calcium: 8.9 mg/dL (ref 8.9–10.3)
Chloride: 103 mmol/L (ref 98–111)
Creatinine, Ser: 0.63 mg/dL (ref 0.44–1.00)
GFR calc Af Amer: >60 mL/min (ref >60)
GFR calc non Af Amer: >60 mL/min (ref >60)
Glucose, Bld: 93 mg/dL (ref 70–99)
POTASSIUM: 4 mmol/L (ref 3.5–5.1)
Sodium: 135 mmol/L (ref 135–145)
TOTAL PROTEIN: 7 g/dL (ref 6.5–8.1)

## 2018-05-22 LAB — CBC
HEMATOCRIT: 29.1 % — AB (ref 36.0–46.0)
Hemoglobin: 9.8 g/dL — ABNORMAL LOW (ref 12.0–15.0)
MCH: 29.5 pg (ref 26.0–34.0)
MCHC: 33.7 g/dL (ref 30.0–36.0)
MCV: 87.7 fL (ref 78.0–100.0)
Platelets: 366 10*3/uL (ref 150–400)
RBC: 3.32 MIL/uL — ABNORMAL LOW (ref 3.87–5.11)
RDW: 13.2 % (ref 11.5–15.5)
WBC: 18.8 10*3/uL — AB (ref 4.0–10.5)

## 2018-05-22 NOTE — Progress Notes (Signed)
Patient ID: Emma Page Knapke, female   DOB: September 17, 1986, 32 y.o.   MRN: [id number] FACULTY PRACTICE ANTEPARTUM(COMPREHENSIVE) NOTE  Emma Page Beauchamp is a 32 y.o. [id number] at 3329w2d by best clinical estimate who is admitted for IUGR in twin B.   Fetal presentation is cephalic and cephalic. Length of Stay:  2  Days  Subjective: Feels well. No complaints this am. Patient reports the fetal movement as active. Patient reports uterine contraction  activity as none. Patient reports  vaginal bleeding as none. Patient describes fluid per vagina as None.  Vitals:  Blood pressure 128/85, pulse 73, temperature 98 F (36.7 C), resp. rate 16, height 5\' 5"  (1.651 m), weight 101.2 kg, last menstrual period 10/15/2017, SpO2 97 %, unknown if currently breastfeeding. Physical Examination:  General appearance - alert, well appearing, and in no distress Chest - normal effort Abdomen - gravid, non-tender Fundal Height:  size greater than dates Extremities: Homans sign is negative, no sign of DVT  Membranes:intact  Fetal Monitoring: A Baseline: 140 bpm, Variability: Good {> 6 bpm), Accelerations: Reactive and Decelerations: Absent  B Baseline: 150 bpm, Variability: Good {> 6 bpm), Accelerations: non-reactive and Decelerations: Absent Labs:  Results for orders placed or performed during the hospital encounter of 05/20/18 (from the past 24 hour(s))  Wet prep, genital   Collection Time: 05/21/18 12:35 PM  Result Value Ref Range   Yeast Wet Prep HPF POC NONE SEEN NONE SEEN   Trich, Wet Prep NONE SEEN NONE SEEN   Clue Cells Wet Prep HPF POC NONE SEEN NONE SEEN   WBC, Wet Prep HPF POC FEW (A) NONE SEEN   Sperm NONE SEEN     Medications:  Scheduled . aspirin EC  81 mg Oral Daily  . labetalol  200 mg Oral BID  . prenatal multivitamin  1 tablet Oral Q1200  . sodium chloride flush  3 mL Intravenous Q12H   I have reviewed the patient's current medications.  ASSESSMENT: Principal Problem:  Abnormal fetal ultrasound Active Problems:   Rh negative state in antepartum period, first trimester   Dichorionic diamniotic twin pregnancy, antepartum   Chronic hypertension during pregnancy, antepartum   Supervision of high risk pregnancy, antepartum   Grand multiparity   Rubella non-immune status, antepartum   PLAN: For repeat Dopplers tomorrow Slight uptick in BPs--check labs  Reva Boresanya S Claus Silvestro, MD 05/22/2018,7:45 AM

## 2018-05-23 ENCOUNTER — Inpatient Hospital Stay (HOSPITAL_BASED_OUTPATIENT_CLINIC_OR_DEPARTMENT_OTHER): Payer: Medicaid Other

## 2018-05-23 DIAGNOSIS — Z3A31 31 weeks gestation of pregnancy: Secondary | ICD-10-CM

## 2018-05-23 DIAGNOSIS — O365932 Maternal care for other known or suspected poor fetal growth, third trimester, fetus 2: Secondary | ICD-10-CM

## 2018-05-23 DIAGNOSIS — O360130 Maternal care for anti-D [Rh] antibodies, third trimester, not applicable or unspecified: Secondary | ICD-10-CM

## 2018-05-23 DIAGNOSIS — O99213 Obesity complicating pregnancy, third trimester: Secondary | ICD-10-CM

## 2018-05-23 DIAGNOSIS — O30043 Twin pregnancy, dichorionic/diamniotic, third trimester: Secondary | ICD-10-CM

## 2018-05-23 DIAGNOSIS — O99820 Streptococcus B carrier state complicating pregnancy: Secondary | ICD-10-CM

## 2018-05-23 DIAGNOSIS — O10013 Pre-existing essential hypertension complicating pregnancy, third trimester: Secondary | ICD-10-CM

## 2018-05-23 LAB — CBC
HCT: 27.5 % — ABNORMAL LOW (ref 36.0–46.0)
HEMOGLOBIN: 9.2 g/dL — AB (ref 12.0–15.0)
MCH: 29.7 pg (ref 26.0–34.0)
MCHC: 33.5 g/dL (ref 30.0–36.0)
MCV: 88.7 fL (ref 78.0–100.0)
Platelets: 333 10*3/uL (ref 150–400)
RBC: 3.1 MIL/uL — AB (ref 3.87–5.11)
RDW: 13.2 % (ref 11.5–15.5)
WBC: 15.8 10*3/uL — ABNORMAL HIGH (ref 4.0–10.5)

## 2018-05-23 LAB — TYPE AND SCREEN
ABO/RH(D): A NEG
Antibody Screen: POSITIVE
UNIT DIVISION: 0
Unit division: 0

## 2018-05-23 LAB — BPAM RBC
Blood Product Expiration Date: 201909062359
Blood Product Expiration Date: 201909222359
ISSUE DATE / TIME: 201908181348
UNIT TYPE AND RH: 9500
Unit Type and Rh: 9500

## 2018-05-23 LAB — CULTURE, BETA STREP (GROUP B ONLY)

## 2018-05-23 LAB — CERVICOVAGINAL ANCILLARY ONLY
Chlamydia: NEGATIVE
Neisseria Gonorrhea: NEGATIVE
TRICH (WINDOWPATH): NEGATIVE

## 2018-05-23 MED ORDER — LACTATED RINGERS IV SOLN
INTRAVENOUS | Status: DC
  Administered 2018-05-27: 13:00:00 via INTRAVENOUS

## 2018-05-23 MED ORDER — FERROUS GLUCONATE 324 (38 FE) MG PO TABS
324.0000 mg | ORAL_TABLET | Freq: Two times a day (BID) | ORAL | Status: DC
  Administered 2018-05-23 – 2018-05-30 (×10): 324 mg via ORAL
  Filled 2018-05-23 (×17): qty 1

## 2018-05-23 MED ORDER — DOCUSATE SODIUM 100 MG PO CAPS
100.0000 mg | ORAL_CAPSULE | Freq: Every day | ORAL | Status: DC
  Administered 2018-05-23 – 2018-05-26 (×4): 100 mg via ORAL
  Filled 2018-05-23 (×4): qty 1

## 2018-05-23 NOTE — Progress Notes (Signed)
[name] refused fetal monitoring at this time. Stated she would call when she was ready to be put on the EFM.

## 2018-05-23 NOTE — Consult Note (Signed)
[facility]       05/23/2018  5:41 PM   I was asked by Dr. Vergie LivingPickens to consult on this patient for possible preterm delivery.  I had the pleasure of meeting with Ms. Sabo today.  She is a 32 y/o [id number] at 8731 and 3/[redacted] weeks gestation with di/di twins (both female) who was admitted for fetal growth restriction and absent end diastolic flow of Twin B.  She is scheduled to deliver at [redacted] weeks gestation, sooner for fetal distress.  Her pregnancy has been complicated by chronic hypertension for which she is on labetalol.  She is not ruptured, has had no bleeding, and is not laboring.  She received a course of betamethasone on 6/28 and 6/29, along with a repeat course on 8/16 and 8/17.  She is GBS positive.  I explained that the neonatal intensive care team would be present for the delivery and outlined the likely delivery room course for the babies including routine resuscitation and NRP-guided approaches to the treatment of respiratory distress. We discussed other common problems associated with prematurity including respiratory distress syndrome/CLD, apnea, feeding issues, temperature regulation, and infection risk.  We briefly discussed IVH and ROP, and that these are complications associated with prematurity, but that by 30 weeks are uncommon.    We discussed the average length of stay but I noted that the actual LOS would depend on the severity of problems encountered and response to treatments.  We discussed visitation policies and the resources available while her infants are in the hospital.  We discussed the importance of good nutrition and various methods of providing nutrition (parenteral hyperalimentation, gavage feedings and/or oral feeding). We discussed the benefits of human milk. I encouraged breast feeding and pumping soon after birth and outlined resources that are available to support breast feeding.  She does intend to provide breast milk.      Thank you for involving us in the care of this patient. A member of our team will be available should the family have additional questions.  Time for consultation approximately 45 minutes.   _____________________ Electronically Signed By: Maryan CharLindsey Kelcey Wickstrom, MD Neonatologist

## 2018-05-23 NOTE — Progress Notes (Signed)
Daily Antepartum Note  Admission Date: 05/20/2018 Current Date: 05/23/2018 10:07 AM  Emma Page is a 32 y.o. [id number] @ 3772w3d, HD#4, admitted for scheduled admission for fetus B with REDF/AEDF.  Pregnancy complicated by: Patient Active Problem List   Diagnosis Date Noted  . Abnormal fetal ultrasound 04/01/2018  . Rubella non-immune status, antepartum 01/11/2018  . Vitamin D deficiency 01/11/2018  . Anemia in pregnancy 01/11/2018  . Supervision of high risk pregnancy, antepartum 01/03/2018  . Grand multiparity 01/03/2018  . History of asthma 01/03/2018  . Rh negative state in antepartum period, first trimester 12/14/2017  . Dichorionic diamniotic twin pregnancy, antepartum 12/14/2017  . Chronic hypertension during pregnancy, antepartum 12/14/2017    Overnight/24hr events:  none  Subjective:  No s/s of pre-eclampsia, PTL or decreased FM  Objective:    Current Vital Signs 24h Vital Sign Ranges  T 98.4 F (36.9 C) Temp  Avg: 98.4 F (36.9 C)  Min: 97.8 F (36.6 C)  Max: 99 F (37.2 C)  BP (!) 127/97 BP  Min: 118/77  Max: 138/94  HR 92 Pulse  Avg: 90  Min: 84  Max: 97  RR 17 Resp  Avg: 17.2  Min: 16  Max: 18  SaO2 100 % Room Air SpO2  Avg: 100 %  Min: 100 %  Max: 100 %       24 Hour I/O Current Shift I/O  Time Ins Outs No intake/output data recorded. No intake/output data recorded.   Patient Vitals for the past 24 hrs:  BP Temp Temp src Pulse Resp SpO2  05/23/18 0753 (!) 127/97 98.4 F (36.9 C) Oral 92 17 100 %  05/23/18 0012 118/77 97.8 F (36.6 C) Oral 84 17 100 %  05/22/18 1920 (!) 138/94 98.5 F (36.9 C) Oral 87 18 100 %  05/22/18 1605 121/88 99 F (37.2 C) Oral 90 16 100 %  05/22/18 1254 120/87 98.4 F (36.9 C) Oral 97 18 100 %   A: 150 baseline, +accels, no decel, mod variabilty B: 150 baseline, no accel, no decel, mod variability Toco: irregular UCs  Physical exam: General: Well nourished, well developed female in no acute distress. Abdomen:  gravid nttp Cardiovascular: S1, S2 normal, no murmur, rub or gallop, regular rate and rhythm Respiratory: CTAB Extremities: no clubbing, cyanosis or edema Skin: Warm and dry.   Medications: Current Facility-Administered Medications  Medication Dose Route Frequency Provider Last Rate Last Dose  . 0.9 %  sodium chloride infusion  250 mL Intravenous PRN Conan Bowensavis, Kelly M, MD      . acetaminophen (TYLENOL) tablet 650 mg  650 mg Oral Q4H PRN Conan Bowensavis, Kelly M, MD   650 mg at 05/20/18 2335  . aspirin EC tablet 81 mg  81 mg Oral Daily Conan Bowensavis, Kelly M, MD   81 mg at 05/23/18 0754  . calcium carbonate (TUMS - dosed in mg elemental calcium) chewable tablet 400 mg of elemental calcium  2 tablet Oral Q4H PRN Conan Bowensavis, Kelly M, MD      . docusate sodium (COLACE) capsule 100 mg  100 mg Oral Daily [place] BingPickens, Alylah Blakney, MD      . ferrous gluconate (FERGON) tablet 324 mg  324 mg Oral BID [place] BingPickens, Alahia Whicker, MD      . labetalol (NORMODYNE) tablet 200 mg  200 mg Oral BID Conan Bowensavis, Kelly M, MD   200 mg at 05/23/18 0754  . prenatal multivitamin tablet 1 tablet  1 tablet Oral Q1200 Conan Bowensavis, Kelly M, MD   1 tablet  at 05/22/18 1252  . sodium chloride flush (NS) 0.9 % injection 3 mL  3 mL Intravenous Q12H Conan Bowensavis, Kelly M, MD      . sodium chloride flush (NS) 0.9 % injection 3 mL  3 mL Intravenous PRN Conan Bowensavis, Kelly M, MD      . zolpidem [facility](AMBIEN) tablet 5 mg  5 mg Oral QHS PRN Conan Bowensavis, Kelly M, MD        Labs:  Recent Labs  Lab 05/20/18 1255 05/22/18 0909 05/22/18 2357  WBC 11.3* 18.8* 15.8*  HGB 10.3* 9.8* 9.2*  HCT 28.1* 29.1* 27.5*  PLT 334 366 333   Radiology:  8/19: A: ceph, 6/8 (breathing), MVP 7.8, normal S/D B: ceph, 6/8 (breathing), MVP 3.26, AEDF with intermittent reverse  8/15 A: ceph, MVP 5.6, 1674gm, efw 57%, AC 32%, normal UA S/D dopplers, discordance 34% B: ceph, MVP 3, <10% 1107gm, AC <3%, REDF  Assessment & Plan:  Pt stable *Pregnancy: routine care. B had reactive NST last night. Today, category I but  not reactive, bpp 6/8. Will repeat NST later today and repeat BPP tomorrow morning. -Patient declines BTL.  *Di-Di twins: desires VD. D/w her that will need to at least have presenting be cephalic and B may not tolerate labor; not a candidate for breech extraction. D/w her re: recommendation for CST if for trial of VD *Fetus B FGR: see above.  *Preterm: s/p NICU consult on prior admission. NICU aware, can repeat consult PRN. -Rescue bmz 8/16 and 8/17. Initial course on 6/28 and 6/29 *cHTN: continue labetalol bid *PPx: SCDs, OOB ad lib *FEN/GI:regular diet *Dispo: trying to get to 32wk deliver  Cornelia Copaharlie Rashay Barnette, [name] Attending Center for [facility](Faculty Practice)

## 2018-05-24 ENCOUNTER — Inpatient Hospital Stay (HOSPITAL_BASED_OUTPATIENT_CLINIC_OR_DEPARTMENT_OTHER): Payer: Medicaid Other

## 2018-05-24 DIAGNOSIS — Z3A31 31 weeks gestation of pregnancy: Secondary | ICD-10-CM

## 2018-05-24 DIAGNOSIS — O10013 Pre-existing essential hypertension complicating pregnancy, third trimester: Secondary | ICD-10-CM

## 2018-05-24 DIAGNOSIS — O99213 Obesity complicating pregnancy, third trimester: Secondary | ICD-10-CM

## 2018-05-24 DIAGNOSIS — O365933 Maternal care for other known or suspected poor fetal growth, third trimester, fetus 3: Secondary | ICD-10-CM

## 2018-05-24 DIAGNOSIS — O360130 Maternal care for anti-D [Rh] antibodies, third trimester, not applicable or unspecified: Secondary | ICD-10-CM

## 2018-05-24 DIAGNOSIS — O365932 Maternal care for other known or suspected poor fetal growth, third trimester, fetus 2: Secondary | ICD-10-CM

## 2018-05-24 DIAGNOSIS — O0943 Supervision of pregnancy with grand multiparity, third trimester: Secondary | ICD-10-CM

## 2018-05-24 DIAGNOSIS — O30043 Twin pregnancy, dichorionic/diamniotic, third trimester: Secondary | ICD-10-CM

## 2018-05-24 NOTE — Progress Notes (Signed)
Daily Antepartum Note  Admission Date: 05/20/2018 Current Date: 05/24/2018 10:56 AM  Emma Page is a 32 y.o. [id number] @ 4717w4d, HD#5, admitted for scheduled admission for fetus B with REDF/AEDF.  Pregnancy complicated by: Patient Active Problem List   Diagnosis Date Noted  . GBS (group B Streptococcus carrier), +RV culture, currently pregnant 05/23/2018  . Abnormal fetal ultrasound 04/01/2018  . Rubella non-immune status, antepartum 01/11/2018  . Vitamin D deficiency 01/11/2018  . Anemia in pregnancy 01/11/2018  . Supervision of high risk pregnancy, antepartum 01/03/2018  . Grand multiparity 01/03/2018  . History of asthma 01/03/2018  . Rh negative state in antepartum period, first trimester 12/14/2017  . Dichorionic diamniotic twin pregnancy, antepartum 12/14/2017  . Chronic hypertension during pregnancy, antepartum 12/14/2017    Overnight/24hr events:  none  Subjective:  No s/s of pre-eclampsia, PTL or decreased FM  Objective:    Current Vital Signs 24h Vital Sign Ranges  T 98.6 F (37 C) Temp  Avg: 98.2 F (36.8 C)  Min: 98 F (36.7 C)  Max: 98.6 F (37 C)  BP (!) 128/94 BP  Min: 116/88  Max: 143/97  HR 83 Pulse  Avg: 94.7  Min: 83  Max: 115  RR 19 Resp  Avg: 19  Min: 18  Max: 21  SaO2 100 % Room Air SpO2  Avg: 99.6 %  Min: 98 %  Max: 100 %       24 Hour I/O Current Shift I/O  Time Ins Outs No intake/output data recorded. No intake/output data recorded.   Patient Vitals for the past 24 hrs:  BP Temp Temp src Pulse Resp SpO2  05/24/18 0804 (!) 128/94 98.6 F (37 C) Oral 83 19 100 %  05/23/18 2330 (!) 132/95 - - 86 19 -  05/23/18 2323 (!) 143/97 98 F (36.7 C) Oral 90 (!) 21 98 %  05/23/18 1956 (!) 124/94 98.1 F (36.7 C) Oral 92 19 100 %  05/23/18 1541 (!) 123/95 98.2 F (36.8 C) Oral (!) 115 18 100 %  05/23/18 1233 116/88 98.1 F (36.7 C) Oral (!) 102 18 100 %   A: 145 baseline, +accels, no decel, mod variabilty B: 140 baseline, no accel, no  decel, mod variability Toco: irregular, rare UCs  Physical exam: General: Well nourished, well developed female in no acute distress. Abdomen: gravid nttp Cardiovascular: S1, S2 normal, no murmur, rub or gallop, regular rate and rhythm Respiratory: CTAB Extremities: no clubbing, cyanosis or edema Skin: Warm and dry.   Medications: Current Facility-Administered Medications  Medication Dose Route Frequency Provider Last Rate Last Dose  . 0.9 %  sodium chloride infusion  250 mL Intravenous PRN Conan Bowensavis, Kelly M, MD      . acetaminophen (TYLENOL) tablet 650 mg  650 mg Oral Q4H PRN Conan Bowensavis, Kelly M, MD   650 mg at 05/20/18 2335  . aspirin EC tablet 81 mg  81 mg Oral Daily Conan Bowensavis, Kelly M, MD   81 mg at 05/24/18 1003  . calcium carbonate (TUMS - dosed in mg elemental calcium) chewable tablet 400 mg of elemental calcium  2 tablet Oral Q4H PRN Conan Bowensavis, Kelly M, MD      . docusate sodium (COLACE) capsule 100 mg  100 mg Oral Daily [place] BingPickens, Shayli Altemose, MD   100 mg at 05/24/18 1003  . ferrous gluconate (FERGON) tablet 324 mg  324 mg Oral BID [place] BingPickens, Vernell Back, MD   324 mg at 05/24/18 1003  . labetalol (NORMODYNE) tablet 200 mg  200  mg Oral BID Conan Bowensavis, Kelly M, MD   200 mg at 05/24/18 [id number]  . [START ON 06/02/2018] lactated ringers infusion   Intravenous Continuous [place] BingPickens, Dijuan Sleeth, MD      . prenatal multivitamin tablet 1 tablet  1 tablet Oral Q1200 Conan Bowensavis, Kelly M, MD   1 tablet at 05/23/18 1023  . sodium chloride flush (NS) 0.9 % injection 3 mL  3 mL Intravenous Q12H Conan Bowensavis, Kelly M, MD      . sodium chloride flush (NS) 0.9 % injection 3 mL  3 mL Intravenous PRN Conan Bowensavis, Kelly M, MD      . zolpidem [facility](AMBIEN) tablet 5 mg  5 mg Oral QHS PRN Conan Bowensavis, Kelly M, MD        Labs:  Recent Labs  Lab 05/20/18 1255 05/22/18 0909 05/22/18 2357  WBC 11.3* 18.8* 15.8*  HGB 10.3* 9.8* 9.2*  HCT 28.1* 29.1* 27.5*  PLT 334 366 333   Radiology:  8/21 A: ceph, 6/8 (breathing), MVP 4.68 B: ceph, 6/8 (breathing), MVP  3.2 8/19 A: ceph, 6/8 (breathing), MVP 7.8, normal S/D B: ceph, 6/8 (breathing), MVP 3.26, AEDF with intermittent reverse  8/15 A: ceph, MVP 5.6, 1674gm, efw 57%, AC 32%, normal UA S/D dopplers, discordance 34% B: ceph, MVP 3, <10% 1107gm, AC <3%, REDF  Assessment & Plan:  Pt stable *Pregnancy: routine care. Bid NSTs. Reactive NST x 2 this morning -Patient declines BTL.  *Di-Di twins: desires VD. D/w her that will need to at least have presenting be cephalic and B may not tolerate labor; not a candidate for breech extraction. D/w her re: recommendation for CST if for trial of VD *Fetus B FGR: see above.  *Preterm: s/p NICU consult.  -Rescue bmz 8/16 and 8/17. Initial course on 6/28 and 6/29 *cHTN: continue labetalol bid *PPx: SCDs, OOB ad lib *FEN/GI:regular diet *Dispo: trying to get to 32wk deliver  Cornelia Copaharlie Opaline Reyburn, [name] Attending Center for [facility](Faculty Practice)

## 2018-05-24 NOTE — Progress Notes (Signed)
Patient refuses fetal monitoring at this time.

## 2018-05-24 NOTE — Progress Notes (Signed)
Refused EFM.

## 2018-05-25 NOTE — Progress Notes (Signed)
Daily Antepartum Note  Admission Date: 05/20/2018 Current Date: 05/25/2018 11:19 AM  Emma Page is a 32 y.o. [id number] @ 2231w5d, HD#6, admitted for scheduled admission for fetus B with REDF/AEDF.  Pregnancy complicated by: Patient Active Problem List   Diagnosis Date Noted  . GBS (group B Streptococcus carrier), +RV culture, currently pregnant 05/23/2018  . Abnormal fetal ultrasound 04/01/2018  . Rubella non-immune status, antepartum 01/11/2018  . Vitamin D deficiency 01/11/2018  . Anemia in pregnancy 01/11/2018  . Supervision of high risk pregnancy, antepartum 01/03/2018  . Grand multiparity 01/03/2018  . History of asthma 01/03/2018  . Rh negative state in antepartum period, first trimester 12/14/2017  . Dichorionic diamniotic twin pregnancy, antepartum 12/14/2017  . Chronic hypertension during pregnancy, antepartum 12/14/2017    Overnight/24hr events:  none  Subjective:  No s/s of pre-eclampsia, PTL or decreased FM  Objective:    Current Vital Signs 24h Vital Sign Ranges  T 98.1 F (36.7 C) Temp  Avg: 98.1 F (36.7 C)  Min: 97.9 F (36.6 C)  Max: 98.3 F (36.8 C)  BP (!) 145/94 BP  Min: 126/84  Max: 153/111  HR 98 Pulse  Avg: 95.8  Min: 87  Max: 106  RR 20 Resp  Avg: 18.6  Min: 16  Max: 20  SaO2 100 % Room Air SpO2  Avg: 98.9 %  Min: 96 %  Max: 100 %       24 Hour I/O Current Shift I/O  Time Ins Outs No intake/output data recorded. No intake/output data recorded.   Patient Vitals for the past 24 hrs:  BP Temp Temp src Pulse Resp SpO2  05/25/18 1104 (!) 145/94 98.1 F (36.7 C) Oral 98 20 100 %  05/25/18 0929 (!) 142/103 - - 91 - -  05/25/18 0911 (!) 145/113 98.1 F (36.7 C) Oral 94 18 100 %  05/25/18 0412 (!) 135/95 98.3 F (36.8 C) Oral 94 16 98 %  05/24/18 2346 (!) 129/93 98 F (36.7 C) Oral 87 18 96 %  05/24/18 1951 (!) 144/103 97.9 F (36.6 C) Oral 91 18 98 %  05/24/18 1609 (!) 143/105 - - 100 - -  05/24/18 1548 (!) 153/111 98.1 F (36.7 C)  Oral (!) 106 20 100 %  05/24/18 1219 126/84 97.9 F (36.6 C) Oral (!) 101 20 100 %   A: 145 baseline, +accels, no decel, mod variabilty B: 135 baseline, +accel, no decel, mod variability Toco: irregular, rare UCs  Physical exam: General: Well nourished, well developed female in no acute distress. Abdomen: gravid nttp Cardiovascular: S1, S2 normal, no murmur, rub or gallop, regular rate and rhythm Respiratory: CTAB Extremities: no clubbing, cyanosis or edema Skin: Warm and dry.   Medications: Current Facility-Administered Medications  Medication Dose Route Frequency Provider Last Rate Last Dose  . 0.9 %  sodium chloride infusion  250 mL Intravenous PRN Conan Bowensavis, Kelly M, MD      . acetaminophen (TYLENOL) tablet 650 mg  650 mg Oral Q4H PRN Conan Bowensavis, Kelly M, MD   650 mg at 05/24/18 1646  . aspirin EC tablet 81 mg  81 mg Oral Daily Conan Bowensavis, Kelly M, MD   81 mg at 05/25/18 1013  . calcium carbonate (TUMS - dosed in mg elemental calcium) chewable tablet 400 mg of elemental calcium  2 tablet Oral Q4H PRN Conan Bowensavis, Kelly M, MD      . docusate sodium (COLACE) capsule 100 mg  100 mg Oral Daily [place] BingPickens, Hommer Cunliffe, MD   100 mg  at 05/25/18 1013  . ferrous gluconate (FERGON) tablet 324 mg  324 mg Oral BID WC [place] BingPickens, Shrinika Blatz, MD   324 mg at 05/25/18 0902  . labetalol (NORMODYNE) tablet 200 mg  200 mg Oral BID Conan Bowensavis, Kelly M, MD   200 mg at 05/25/18 0902  . [START ON 06/02/2018] lactated ringers infusion   Intravenous Continuous [place] BingPickens, Jacyln Carmer, MD      . prenatal multivitamin tablet 1 tablet  1 tablet Oral Q1200 Conan Bowensavis, Kelly M, MD   1 tablet at 05/24/18 1237  . sodium chloride flush (NS) 0.9 % injection 3 mL  3 mL Intravenous Q12H Conan Bowensavis, Kelly M, MD      . sodium chloride flush (NS) 0.9 % injection 3 mL  3 mL Intravenous PRN Conan Bowensavis, Kelly M, MD      . zolpidem [facility](AMBIEN) tablet 5 mg  5 mg Oral QHS PRN Conan Bowensavis, Kelly M, MD        Labs:  Recent Labs  Lab 05/20/18 1255 05/22/18 0909 05/22/18 2357  WBC 11.3*  18.8* 15.8*  HGB 10.3* 9.8* 9.2*  HCT 28.1* 29.1* 27.5*  PLT 334 366 333   Radiology:  8/21 A: ceph, 6/8 (breathing), MVP 4.68 B: ceph, 6/8 (breathing), MVP 3.2 8/19 A: ceph, 6/8 (breathing), MVP 7.8, normal S/D B: ceph, 6/8 (breathing), MVP 3.26, AEDF with intermittent reverse 8/15 A: ceph, MVP 5.6, 1674gm, efw 57%, AC 32%, normal UA S/D dopplers, discordance 34% B: ceph, MVP 3, <10% 1107gm, AC <3%, REDF  Assessment & Plan:  Pt stable *Pregnancy: routine care. Bid NSTs. EFM pending for this morning -Patient declines BTL.  *Di-Di twins: desires VD. D/w her that will need to at least have presenting be cephalic and B may not tolerate labor; not a candidate for breech extraction. D/w her re: recommendation for CST if for trial of VD *Fetus B FGR: see above.  *Preterm: s/p NICU consult.  -Rescue bmz 8/16 and 8/17. Initial course on 6/28 and 6/29 *cHTN: continue labetalol bid *PPx: SCDs, OOB ad lib *FEN/GI:regular diet *Dispo: trying to get to 32wk delivery  Emma Page, [name] Attending Center for [facility](Faculty Practice)

## 2018-05-26 LAB — BPAM RBC
BLOOD PRODUCT EXPIRATION DATE: 201909142359
Blood Product Expiration Date: 201909162359
UNIT TYPE AND RH: 600
Unit Type and Rh: 600

## 2018-05-26 LAB — TYPE AND SCREEN
ABO/RH(D): A NEG
Antibody Screen: POSITIVE
Unit division: 0
Unit division: 0

## 2018-05-26 LAB — CBC
HEMATOCRIT: 32.5 % — AB (ref 36.0–46.0)
Hemoglobin: 10.8 g/dL — ABNORMAL LOW (ref 12.0–15.0)
MCH: 29.2 pg (ref 26.0–34.0)
MCHC: 33.2 g/dL (ref 30.0–36.0)
MCV: 87.8 fL (ref 78.0–100.0)
Platelets: 419 10*3/uL — ABNORMAL HIGH (ref 150–400)
RBC: 3.7 MIL/uL — ABNORMAL LOW (ref 3.87–5.11)
RDW: 13.5 % (ref 11.5–15.5)
WBC: 13 10*3/uL — ABNORMAL HIGH (ref 4.0–10.5)

## 2018-05-26 NOTE — Progress Notes (Signed)
Pt refusing EFM at this time. Pt states she will only monitor BID

## 2018-05-26 NOTE — Progress Notes (Signed)
Dr Vergie LivingPickens reviewed strip, okay for repeat NST tonight as patient refuses EFM.

## 2018-05-27 ENCOUNTER — Encounter (HOSPITAL_COMMUNITY): Payer: Self-pay | Admitting: *Deleted

## 2018-05-27 ENCOUNTER — Encounter (HOSPITAL_COMMUNITY)
Admission: AD | Disposition: A | Discharge status: Home / Self Care | Payer: Self-pay | Source: Home / Self Care | Attending: Obstetrics and Gynecology

## 2018-05-27 ENCOUNTER — Inpatient Hospital Stay (HOSPITAL_COMMUNITY): Payer: Medicaid Other | Admitting: Anesthesiology

## 2018-05-27 DIAGNOSIS — Z3A32 32 weeks gestation of pregnancy: Secondary | ICD-10-CM

## 2018-05-27 DIAGNOSIS — O30043 Twin pregnancy, dichorionic/diamniotic, third trimester: Secondary | ICD-10-CM

## 2018-05-27 DIAGNOSIS — O1002 Pre-existing essential hypertension complicating childbirth: Secondary | ICD-10-CM

## 2018-05-27 SURGERY — Surgical Case
Anesthesia: Spinal | Site: Abdomen | Wound class: Clean Contaminated

## 2018-05-27 MED ORDER — KETOROLAC TROMETHAMINE 30 MG/ML IJ SOLN: 30.0000 mg | Freq: Four times a day (QID) | INTRAMUSCULAR | Status: AC | PRN

## 2018-05-27 MED ORDER — FENTANYL CITRATE (PF) 100 MCG/2ML IJ SOLN
INTRAMUSCULAR | Status: AC
  Filled 2018-05-27: qty 2

## 2018-05-27 MED ORDER — BUPIVACAINE IN DEXTROSE 0.75-8.25 % IT SOLN
INTRATHECAL | Status: DC | PRN
  Administered 2018-05-27: 1.6 mL via INTRATHECAL

## 2018-05-27 MED ORDER — CEFAZOLIN SODIUM-DEXTROSE 2-4 GM/100ML-% IV SOLN
2.0000 g | Freq: Once | INTRAVENOUS | Status: AC
  Administered 2018-05-27: 2 g via INTRAVENOUS
  Filled 2018-05-27: qty 100

## 2018-05-27 MED ORDER — NALBUPHINE HCL 10 MG/ML IJ SOLN: 5.0000 mg | Freq: Once | INTRAMUSCULAR | Status: DC | PRN

## 2018-05-27 MED ORDER — SCOPOLAMINE 1 MG/3DAYS TD PT72
MEDICATED_PATCH | TRANSDERMAL | Status: DC | PRN
  Administered 2018-05-27: 1 via TRANSDERMAL

## 2018-05-27 MED ORDER — PHENYLEPHRINE HCL 10 MG/ML IJ SOLN
INTRAMUSCULAR | Status: DC | PRN
  Administered 2018-05-27: 80 ug via INTRAVENOUS

## 2018-05-27 MED ORDER — DIPHENHYDRAMINE HCL 25 MG PO CAPS
25.0000 mg | ORAL_CAPSULE | ORAL | Status: DC | PRN
  Filled 2018-05-27: qty 1

## 2018-05-27 MED ORDER — PHENYLEPHRINE 40 MCG/ML (10ML) SYRINGE FOR IV PUSH (FOR BLOOD PRESSURE SUPPORT)
PREFILLED_SYRINGE | INTRAVENOUS | Status: AC
  Filled 2018-05-27: qty 10

## 2018-05-27 MED ORDER — NALOXONE HCL 0.4 MG/ML IJ SOLN: 0.4000 mg | INTRAMUSCULAR | Status: DC | PRN

## 2018-05-27 MED ORDER — PRENATAL MULTIVITAMIN CH
1.0000 | ORAL_TABLET | Freq: Every day | ORAL | Status: DC
  Administered 2018-05-28 – 2018-05-29 (×2): 1 via ORAL
  Filled 2018-05-27 (×2): qty 1

## 2018-05-27 MED ORDER — MENTHOL 3 MG MT LOZG: 1.0000 | LOZENGE | OROMUCOSAL | Status: DC | PRN

## 2018-05-27 MED ORDER — SOD CITRATE-CITRIC ACID 500-334 MG/5ML PO SOLN
ORAL | Status: AC
  Administered 2018-05-27: 30 mL
  Filled 2018-05-27: qty 15

## 2018-05-27 MED ORDER — PHENYLEPHRINE 8 MG IN D5W 100 ML (0.08MG/ML) PREMIX OPTIME
INJECTION | INTRAVENOUS | Status: DC | PRN
  Administered 2018-05-27: 60 ug/min via INTRAVENOUS

## 2018-05-27 MED ORDER — NALOXONE HCL 4 MG/10ML IJ SOLN
1.0000 ug/kg/h | INTRAVENOUS | Status: DC | PRN
  Filled 2018-05-27: qty 5

## 2018-05-27 MED ORDER — OXYCODONE HCL 5 MG PO TABS
10.0000 mg | ORAL_TABLET | Freq: Four times a day (QID) | ORAL | Status: DC | PRN
  Administered 2018-05-28 – 2018-05-30 (×9): 10 mg via ORAL
  Filled 2018-05-27 (×9): qty 2

## 2018-05-27 MED ORDER — STERILE WATER FOR IRRIGATION IR SOLN
Status: DC | PRN
  Administered 2018-05-27: 1000 mL

## 2018-05-27 MED ORDER — SOD CITRATE-CITRIC ACID 500-334 MG/5ML PO SOLN: 30.0000 mL | Freq: Once | ORAL | Status: DC

## 2018-05-27 MED ORDER — ONDANSETRON HCL 4 MG/2ML IJ SOLN
INTRAMUSCULAR | Status: DC | PRN
  Administered 2018-05-27: 4 mg via INTRAVENOUS

## 2018-05-27 MED ORDER — MORPHINE SULFATE (PF) 0.5 MG/ML IJ SOLN
INTRAMUSCULAR | Status: AC
  Filled 2018-05-27: qty 10

## 2018-05-27 MED ORDER — MEPERIDINE HCL 25 MG/ML IJ SOLN: 6.2500 mg | INTRAMUSCULAR | Status: DC | PRN

## 2018-05-27 MED ORDER — TETANUS-DIPHTH-ACELL PERTUSSIS 5-2.5-18.5 LF-MCG/0.5 IM SUSP: 0.5000 mL | Freq: Once | INTRAMUSCULAR | Status: DC

## 2018-05-27 MED ORDER — DIPHENHYDRAMINE HCL 25 MG PO CAPS: 25.0000 mg | ORAL_CAPSULE | Freq: Four times a day (QID) | ORAL | Status: DC | PRN

## 2018-05-27 MED ORDER — TRANEXAMIC ACID 1000 MG/10ML IV SOLN
1000.0000 mg | INTRAVENOUS | Status: AC
  Administered 2018-05-27: 1000 mg via INTRAVENOUS

## 2018-05-27 MED ORDER — DIPHENHYDRAMINE HCL 50 MG/ML IJ SOLN: 12.5000 mg | INTRAMUSCULAR | Status: DC | PRN

## 2018-05-27 MED ORDER — PROMETHAZINE HCL 25 MG/ML IJ SOLN: 6.2500 mg | INTRAMUSCULAR | Status: DC | PRN

## 2018-05-27 MED ORDER — PHENYLEPHRINE 8 MG IN D5W 100 ML (0.08MG/ML) PREMIX OPTIME
INJECTION | INTRAVENOUS | Status: AC
  Filled 2018-05-27: qty 100

## 2018-05-27 MED ORDER — OXYTOCIN 10 UNIT/ML IJ SOLN
INTRAMUSCULAR | Status: AC
  Filled 2018-05-27: qty 1

## 2018-05-27 MED ORDER — ENOXAPARIN SODIUM 60 MG/0.6ML ~~LOC~~ SOLN
50.0000 mg | SUBCUTANEOUS | Status: DC
  Administered 2018-05-28 – 2018-05-29 (×2): 50 mg via SUBCUTANEOUS
  Filled 2018-05-27 (×3): qty 0.6

## 2018-05-27 MED ORDER — DEXAMETHASONE SODIUM PHOSPHATE 4 MG/ML IJ SOLN
INTRAMUSCULAR | Status: DC | PRN
  Administered 2018-05-27: 4 mg via INTRAVENOUS

## 2018-05-27 MED ORDER — SODIUM CHLORIDE 0.9 % IR SOLN
Status: DC | PRN
  Administered 2018-05-27: 1000 mL

## 2018-05-27 MED ORDER — TRANEXAMIC ACID 1000 MG/10ML IV SOLN
INTRAVENOUS | Status: AC
  Filled 2018-05-27: qty 10

## 2018-05-27 MED ORDER — ONDANSETRON HCL 4 MG/2ML IJ SOLN
INTRAMUSCULAR | Status: AC
  Filled 2018-05-27: qty 2

## 2018-05-27 MED ORDER — KETOROLAC TROMETHAMINE 30 MG/ML IJ SOLN: 30.0000 mg | Freq: Once | INTRAMUSCULAR | Status: DC | PRN

## 2018-05-27 MED ORDER — DIBUCAINE 1 % RE OINT: 1.0000 "application " | TOPICAL_OINTMENT | RECTAL | Status: DC | PRN

## 2018-05-27 MED ORDER — ONDANSETRON HCL 4 MG/2ML IJ SOLN: 4.0000 mg | Freq: Three times a day (TID) | INTRAMUSCULAR | Status: DC | PRN

## 2018-05-27 MED ORDER — OXYCODONE HCL 5 MG PO TABS
5.0000 mg | ORAL_TABLET | Freq: Four times a day (QID) | ORAL | Status: DC | PRN
  Filled 2018-05-27 (×2): qty 1

## 2018-05-27 MED ORDER — OXYTOCIN 40 UNITS IN LACTATED RINGERS INFUSION - SIMPLE MED: 2.5000 [IU]/h | INTRAVENOUS | Status: AC

## 2018-05-27 MED ORDER — IBUPROFEN 600 MG PO TABS: 600.0000 mg | ORAL_TABLET | Freq: Four times a day (QID) | ORAL | Status: DC | PRN

## 2018-05-27 MED ORDER — ACETAMINOPHEN 325 MG PO TABS: 650.0000 mg | ORAL_TABLET | ORAL | Status: DC | PRN

## 2018-05-27 MED ORDER — SCOPOLAMINE 1 MG/3DAYS TD PT72
MEDICATED_PATCH | TRANSDERMAL | Status: AC
  Filled 2018-05-27: qty 1

## 2018-05-27 MED ORDER — WITCH HAZEL-GLYCERIN EX PADS: 1.0000 "application " | MEDICATED_PAD | CUTANEOUS | Status: DC | PRN

## 2018-05-27 MED ORDER — LACTATED RINGERS IV SOLN: INTRAVENOUS | Status: DC

## 2018-05-27 MED ORDER — HYDROMORPHONE HCL 1 MG/ML IJ SOLN: 0.2500 mg | INTRAMUSCULAR | Status: DC | PRN

## 2018-05-27 MED ORDER — ACETAMINOPHEN 500 MG PO TABS
1000.0000 mg | ORAL_TABLET | Freq: Four times a day (QID) | ORAL | Status: AC
  Administered 2018-05-27 – 2018-05-28 (×2): 1000 mg via ORAL
  Filled 2018-05-27 (×2): qty 2

## 2018-05-27 MED ORDER — FENTANYL CITRATE (PF) 100 MCG/2ML IJ SOLN
INTRAMUSCULAR | Status: DC | PRN
  Administered 2018-05-27: 15 ug via INTRATHECAL

## 2018-05-27 MED ORDER — OXYTOCIN 10 UNIT/ML IJ SOLN
INTRAVENOUS | Status: DC | PRN
  Administered 2018-05-27: 40 [IU] via INTRAVENOUS

## 2018-05-27 MED ORDER — KETOROLAC TROMETHAMINE 30 MG/ML IJ SOLN
30.0000 mg | Freq: Four times a day (QID) | INTRAMUSCULAR | Status: AC | PRN
  Administered 2018-05-27: 30 mg via INTRAVENOUS
  Filled 2018-05-27: qty 1

## 2018-05-27 MED ORDER — SIMETHICONE 80 MG PO CHEW
80.0000 mg | CHEWABLE_TABLET | Freq: Three times a day (TID) | ORAL | Status: DC
  Administered 2018-05-28 – 2018-05-30 (×6): 80 mg via ORAL
  Filled 2018-05-27 (×6): qty 1

## 2018-05-27 MED ORDER — DEXAMETHASONE SODIUM PHOSPHATE 4 MG/ML IJ SOLN
INTRAMUSCULAR | Status: AC
  Filled 2018-05-27: qty 1

## 2018-05-27 MED ORDER — COCONUT OIL OIL: 1.0000 "application " | TOPICAL_OIL | Status: DC | PRN

## 2018-05-27 MED ORDER — LACTATED RINGERS IV SOLN
INTRAVENOUS | Status: DC
  Administered 2018-05-27 (×2): via INTRAVENOUS

## 2018-05-27 MED ORDER — MORPHINE SULFATE (PF) 0.5 MG/ML IJ SOLN
INTRAMUSCULAR | Status: DC | PRN
  Administered 2018-05-27: .15 mg via INTRATHECAL

## 2018-05-27 MED ORDER — SCOPOLAMINE 1 MG/3DAYS TD PT72: 1.0000 | MEDICATED_PATCH | Freq: Once | TRANSDERMAL | Status: DC

## 2018-05-27 MED ORDER — LACTATED RINGERS IV SOLN
INTRAVENOUS | Status: DC
  Administered 2018-05-27: 22:00:00 via INTRAVENOUS

## 2018-05-27 MED ORDER — SODIUM CHLORIDE 0.9% FLUSH: 3.0000 mL | INTRAVENOUS | Status: DC | PRN

## 2018-05-27 MED ORDER — IBUPROFEN 600 MG PO TABS
600.0000 mg | ORAL_TABLET | Freq: Four times a day (QID) | ORAL | Status: DC | PRN
  Administered 2018-05-28 – 2018-05-30 (×6): 600 mg via ORAL
  Filled 2018-05-27 (×6): qty 1

## 2018-05-27 MED ORDER — POLYETHYLENE GLYCOL 3350 17 G PO PACK
17.0000 g | PACK | Freq: Every day | ORAL | Status: DC
  Administered 2018-05-30: 17 g via ORAL
  Filled 2018-05-27: qty 1

## 2018-05-27 MED ORDER — SENNOSIDES-DOCUSATE SODIUM 8.6-50 MG PO TABS
2.0000 | ORAL_TABLET | Freq: Every evening | ORAL | Status: DC | PRN
  Administered 2018-05-29 (×2): 2 via ORAL
  Filled 2018-05-27 (×2): qty 2

## 2018-05-27 MED ORDER — NALBUPHINE HCL 10 MG/ML IJ SOLN: 5.0000 mg | INTRAMUSCULAR | Status: DC | PRN

## 2018-05-27 SURGICAL SUPPLY — 32 items
APL SKNCLS STERI-STRIP NONHPOA (GAUZE/BANDAGES/DRESSINGS) ×2
BENZOIN TINCTURE PRP APPL 2/3 (GAUZE/BANDAGES/DRESSINGS) ×3 IMPLANT
CHLORAPREP W/TINT 26ML (MISCELLANEOUS) ×4 IMPLANT
CLAMP CORD UMBIL (MISCELLANEOUS) ×16 IMPLANT
CLOSURE WOUND 1/2 X4 (GAUZE/BANDAGES/DRESSINGS) ×1
CLOTH BEACON ORANGE TIMEOUT ST (SAFETY) ×4 IMPLANT
DRSG OPSITE POSTOP 4X10 (GAUZE/BANDAGES/DRESSINGS) ×4 IMPLANT
ELECT REM PT RETURN 9FT ADLT (ELECTROSURGICAL) ×4
ELECTRODE REM PT RTRN 9FT ADLT (ELECTROSURGICAL) ×2 IMPLANT
GLOVE BIO SURGEON STRL SZ7 (GLOVE) ×4 IMPLANT
GLOVE BIOGEL PI IND STRL 7.0 (GLOVE) ×4 IMPLANT
GLOVE BIOGEL PI INDICATOR 7.0 (GLOVE) ×4
GOWN STRL REUS W/TWL LRG LVL3 (GOWN DISPOSABLE) ×8 IMPLANT
KIT ABG SYR 3ML LUER SLIP (SYRINGE) ×6 IMPLANT
NDL HYPO 25X5/8 SAFETYGLIDE (NEEDLE) ×1 IMPLANT
NEEDLE HYPO 25X5/8 SAFETYGLIDE (NEEDLE) ×4 IMPLANT
NS IRRIG 1000ML POUR BTL (IV SOLUTION) ×4 IMPLANT
PACK C SECTION WH (CUSTOM PROCEDURE TRAY) ×4 IMPLANT
PAD OB MATERNITY 4.3X12.25 (PERSONAL CARE ITEMS) ×4 IMPLANT
PENCIL SMOKE EVAC W/HOLSTER (ELECTROSURGICAL) ×4 IMPLANT
RTRCTR C-SECT PINK 25CM LRG (MISCELLANEOUS) ×4 IMPLANT
STRIP CLOSURE SKIN 1/2X4 (GAUZE/BANDAGES/DRESSINGS) ×2 IMPLANT
SUT MNCRL AB 4-0 PS2 18 (SUTURE) ×3 IMPLANT
SUT PLAIN 2 0 XLH (SUTURE) ×3 IMPLANT
SUT VIC AB 0 CTX 36 (SUTURE) ×20
SUT VIC AB 0 CTX36XBRD ANBCTRL (SUTURE) ×10 IMPLANT
SUT VIC AB 2-0 CT1 27 (SUTURE) ×4
SUT VIC AB 2-0 CT1 TAPERPNT 27 (SUTURE) ×1 IMPLANT
SUT VIC AB 4-0 KS 27 (SUTURE) ×4 IMPLANT
SYR KIT LINE DRAW 1CC W/FILTR (LINER) ×4 IMPLANT
TOWEL OR 17X24 6PK STRL BLUE (TOWEL DISPOSABLE) ×4 IMPLANT
TRAY FOLEY W/BAG SLVR 14FR LF (SET/KITS/TRAYS/PACK) ×4 IMPLANT

## 2018-05-27 NOTE — Op Note (Signed)
05/27/2018  2:26 PM  PATIENT:  Emma GalasBrittany Page  32 y.o. female  PRE-OPERATIVE DIAGNOSIS:  twin gestation, reverse end diastolic flow  POST-OPERATIVE DIAGNOSIS:  twin gestation, reverse end diastolic flow  PROCEDURE:  Procedure(s): CESAREAN SECTION MULTI-GESTATIONAL (N/A)  SURGEON:  Surgeon(s) and Role:    * Lesly DukesLeggett, Samantha Olivera H, MD - Primary  ASSISTANTS: Rosealee AlbeeKat Wallace, MD   ANESTHESIA:   spinal  EBL:  423 mL   BLOOD ADMINISTERED:none  DRAINS: none   LOCAL MEDICATIONS USED:  NONE  SPECIMEN:  Source of Specimen:  placenta  DISPOSITION OF SPECIMEN:  pathology  COUNTS:  YES  TOURNIQUET:  * No tourniquets in log *  PLAN OF CARE: Admit to inpatient   PATIENT DISPOSITION:  PACU - hemodynamically stable.  FINDINGS:   Baby A:  viable female  infant in cephalic presentation, 5,8 Apgars,  3lbs 14.8 ounces , clear amniotic fluid.  Intact placenta, three vessel cord. Baby B:  Viable female infant, 5,8 Apgars, weight pending. Grossly normal uterus, ovaries and fallopian tubes.  PROCEDURE IN DETAIL:  The patient received intravenous antibiotics and had sequential compression devices applied to her lower extremities.  Spinal anesthesia was administered and was found to be adequate. She was then placed in a dorsal supine position with a leftward tilt, and prepped and draped in a sterile manner.  A foley catheter was placed into her bladder and attached to constant gravity.  After an adequate timeout was performed, a Pfannenstiel skin incision was made with scalpel and carried through to the underlying layer of fascia. The fascia was incised in the midline and this incision was extended bilaterally using the Mayo scissors. Kocher clamps were applied to the superior aspect of the fascial incision and the underlying rectus muscles were dissected off bluntly. A similar process was carried out on the inferior aspect of the facial incision. The rectus muscles were separated in the midline bluntly  and the peritoneum was entered bluntly.   A transverse hysterotomy was made with a scalpel and extended bilaterally bluntly. The bladder blade was then removed. The infant was successfully delivered.  The second sac was ruptured and Baby B delivered vertex.  The cords were clamped and cut and the infants were handed over to awaiting neonatology team. Uterine massage was then administered and the placentas delivered intact. The uterus was cleared of clot and debris.  The hysterotomy was closed with 0 vicryl.  A second imbricating suture of 0-Vicryl was used to reinforce the incision and aid in hemostasis.  The peritoneum and rectus muscles were noted to be hemostatic.  The peritoneum was closed with 0-Vicryl.  The fascia was closed with 0-Vicryl in a running fashion with good restoration of anatomy.  The subcutaneus tissue was copiously irrigated and re-approximated with 2-0 plain.  The skin was closed with 4-0 Monocryl in a subcuticular fashion.    Pt tolerated the procedure will.  All counts were correct x2.  Pt went to the recovery room in stable condition.

## 2018-05-27 NOTE — Anesthesia Postprocedure Evaluation (Signed)
Anesthesia Post Note  Patient: [place]any Maslow  Procedure(s) Performed: CESAREAN SECTION MULTI-GESTATIONAL (N/A Abdomen)     Patient location during evaluation: Mother Baby Anesthesia Type: Spinal Level of consciousness: awake and alert Pain management: pain level controlled Vital Signs Assessment: post-procedure vital signs reviewed and stable Respiratory status: spontaneous breathing, nonlabored ventilation and respiratory function stable Cardiovascular status: stable Postop Assessment: no headache, no backache, spinal receding, able to ambulate, adequate PO intake, no apparent nausea or vomiting and patient able to bend at knees Anesthetic complications: no    Last Vitals:  Vitals:   05/27/18 1500 05/27/18 1600  BP: (!) 140/97 (!) 150/84  Pulse: 75 82  Resp: 11   Temp:    SpO2: 100% 100%    Last Pain:  Vitals:   05/27/18 1430  TempSrc:   PainSc: 0-No pain   Pain Goal: Patients Stated Pain Goal: 4 (05/25/18 0900)               Donnalee CurryMalinova,Rudie Sermons Hristova

## 2018-05-27 NOTE — Addendum Note (Signed)
Addendum  created 05/27/18 1627 by Elgie CongoMalinova, Khyren Hing H, CRNA   Sign clinical note

## 2018-05-27 NOTE — Anesthesia Procedure Notes (Signed)
Spinal  Patient location during procedure: OR Start time: 05/27/2018 12:37 PM End time: 05/27/2018 12:42 PM Staffing Anesthesiologist: Leilani AbleHatchett, Musette Kisamore, MD Performed: anesthesiologist  Preanesthetic Checklist Completed: patient identified, site marked, surgical consent, pre-op evaluation, timeout performed, IV checked, risks and benefits discussed and monitors and equipment checked Spinal Block Patient position: sitting Prep: site prepped and draped and DuraPrep Patient monitoring: continuous pulse ox and blood pressure Approach: midline Location: L3-4 Injection technique: single-shot Needle Needle type: Pencan  Needle gauge: 24 G Needle length: 10 cm Needle insertion depth: 8 cm Assessment Sensory level: T4

## 2018-05-27 NOTE — Transfer of Care (Signed)
Immediate Anesthesia Transfer of Care Note  Patient: Emma Page  Procedure(s) Performed: CESAREAN SECTION MULTI-GESTATIONAL (N/A Abdomen)  Patient Location: PACU  Anesthesia Type:Spinal  Level of Consciousness: awake, alert  and oriented  Airway & Oxygen Therapy: Patient Spontanous Breathing  Post-op Assessment: Report given to RN and Post -op Vital signs reviewed and stable  Post vital signs: Reviewed and stable  Last Vitals:  Vitals Value Taken Time  BP    Temp    Pulse    Resp    SpO2      Last Pain:  Vitals:   05/27/18 0831  TempSrc: Oral  PainSc:       Patients Stated Pain Goal: 4 (05/25/18 0900)  Complications: No apparent anesthesia complications

## 2018-05-27 NOTE — Brief Op Note (Signed)
05/27/2018  2:26 PM  PATIENT:  Emma Page  32 y.o. female  PRE-OPERATIVE DIAGNOSIS:  twin gestation, reverse end diastolic flow  POST-OPERATIVE DIAGNOSIS:  twin gestation, reverse end diastolic flow  PROCEDURE:  Procedure(s): CESAREAN SECTION MULTI-GESTATIONAL (N/A)  SURGEON:  Surgeon(s) and Role:    * Lesly DukesLeggett, Uchechukwu Dhawan H, MD - Primary  ASSISTANTS: Rosealee AlbeeKat Wallace, MD   ANESTHESIA:   spinal  EBL:  423 mL   BLOOD ADMINISTERED:none  DRAINS: none   LOCAL MEDICATIONS USED:  NONE  SPECIMEN:  Source of Specimen:  placenta  DISPOSITION OF SPECIMEN:  L & D  COUNTS:  YES  TOURNIQUET:  * No tourniquets in log *  DICTATION: .Note written in EPIC  PLAN OF CARE: Admit to inpatient   PATIENT DISPOSITION:  PACU - hemodynamically stable.   Delay start of Pharmacological VTE agent (>24hrs) due to surgical blood loss or risk of bleeding: not applicable

## 2018-05-27 NOTE — Progress Notes (Signed)
Patient ID: Emma GalasBrittany Otwell, female   DOB: [birth year], 32 y.o.   MRN: [id number] Bedside U/S completed and confirmed vtx/vxt presentation. Pt to be transferred to L & D this AM for IOL.

## 2018-05-27 NOTE — Progress Notes (Signed)
Patient ID: Emma Page, female   DOB: [birth year], 32 y.o.   MRN: [id number]  Pt wanted to discuss delivery route after her discussion with Dr. Judeth CornfieldShankar.  She would like to proceed with primary c/s given Reverse EDF and poor tolerance to labor.    The risks of cesarean section discussed with the patient included but were not limited to: bleeding which may require transfusion or reoperation; infection which may require antibiotics; injury to bowel, bladder, ureters or other surrounding organs; injury to the fetus; need for additional procedures including hysterectomy in the event of a life-threatening hemorrhage; placental abnormalities wth subsequent pregnancies, incisional problems, thromboembolic phenomenon and other postoperative/anesthesia complications. The patient concurred with the proposed plan, giving informed written consent for the procedure.

## 2018-05-27 NOTE — Anesthesia Pain Management Evaluation Note (Signed)
  CRNA Pain Management Visit Note  Patient: Emma GalasBrittany Soo, 32 y.o., female  "Hello [facility] am a [facility] the [facility]. We have an anesthesia team available at all times to provide care throughout the hospital, including epidural management and anesthesia for C-section. I don't know your plan for the delivery whether it a natural birth, water birth, IV sedation, nitrous supplementation, doula or epidural, but we want to meet your pain goals."   1.Was your pain managed to your expectations on prior hospitalizations?   Yes   2.What is your expectation for pain management during this hospitalization?     Epidural  3.How can we help you reach that goal? Pt has twins. MFM recommending C/S. Pt desires vag birth. Plan unclear at this time. Talked to her about epidural and spinal.  Record the patient's initial score and the patient's pain goal.   Pain: 0  Pain Goal: 5 [facility] wants you to be able to say your pain was always managed very well.  Cleda ClarksBrowder, Elva Breaker R 05/27/2018

## 2018-05-27 NOTE — Anesthesia Postprocedure Evaluation (Signed)
Anesthesia Post Note  Patient: Emma Page  Procedure(s) Performed: CESAREAN SECTION MULTI-GESTATIONAL (N/A Abdomen)     Patient location during evaluation: PACU Anesthesia Type: Spinal Level of consciousness: awake Pain management: pain level controlled Vital Signs Assessment: post-procedure vital signs reviewed and stable Respiratory status: spontaneous breathing Cardiovascular status: stable Postop Assessment: no headache, no backache, spinal receding, patient able to bend at knees and no apparent nausea or vomiting Anesthetic complications: no    Last Vitals:  Vitals:   05/27/18 1445 05/27/18 1500  BP:  (!) 140/97  Pulse: 74 75  Resp: 16 11  Temp:    SpO2: 100% 100%    Last Pain:  Vitals:   05/27/18 1430  TempSrc:   PainSc: 0-No pain   Pain Goal: Patients Stated Pain Goal: 4 (05/25/18 0900)               Joandy Burget JR,JOHN Trystyn Dolley

## 2018-05-27 NOTE — Progress Notes (Signed)
Maternal-Fetal Medicine  Emma Page is 32-weeks' pregnant with dichorionic-diamniotic twin pregnancy with growth restriction of twin B. She will be delivered today.  I discussed the challenges that could be encountered during labor. Uterine contractions will pose additional stress and twin B may not tolerate labor. There is a high-likelihood of emergency cesarean delivery. Also, cesarean section may have to be performed after vaginal delivery of twin A in nonreassuring fetal heart trace occurs in twin B.  Overall, elective cesarean delivery may be a better choice.  Patient is considering and will make a decision now.

## 2018-05-27 NOTE — Progress Notes (Signed)
11:00-11:41 RN at bedside adjusting monitors after pt requests positionchange; MD at bedside to assist with FHR monitor positioning via ultrasound. Pt able to feel fetal movement.

## 2018-05-27 NOTE — Anesthesia Preprocedure Evaluation (Signed)
Anesthesia Evaluation  Patient identified by MRN, date of birth, ID band Patient awake    Reviewed: Allergy & Precautions, H&P , NPO status , Patient's Chart, lab work & pertinent test results  Airway Mallampati: II  TM Distance: >3 FB Neck ROM: full    Dental no notable dental hx. (+) Teeth Intact   Pulmonary asthma ,    Pulmonary exam normal breath sounds clear to auscultation       Cardiovascular hypertension, Pt. on home beta blockers Normal cardiovascular exam Rhythm:regular Rate:Normal     Neuro/Psych negative neurological ROS  negative psych ROS   GI/Hepatic negative GI ROS, Neg liver ROS,   Endo/Other  negative endocrine ROS  Renal/GU negative Renal ROS  negative genitourinary   Musculoskeletal negative musculoskeletal ROS (+)   Abdominal (+) + obese,   Peds  Hematology negative hematology ROS (+) anemia ,   Anesthesia Other Findings   Reproductive/Obstetrics (+) Pregnancy                             Anesthesia Physical Anesthesia Plan  ASA: III  Anesthesia Plan: Spinal   Post-op Pain Management:    Induction: Intravenous  PONV Risk Score and Plan: 3 and Ondansetron, Dexamethasone and Scopolamine patch - Pre-op  Airway Management Planned: Natural Airway and Nasal Cannula  Additional Equipment:   Intra-op Plan:   Post-operative Plan:   Informed Consent: I have reviewed the patients History and Physical, chart, labs and discussed the procedure including the risks, benefits and alternatives for the proposed anesthesia with the patient or authorized representative who has indicated his/her understanding and acceptance.     Plan Discussed with: CRNA and Surgeon  Anesthesia Plan Comments:         Anesthesia Quick Evaluation

## 2018-05-28 LAB — CBC
HEMATOCRIT: 28.1 % — AB (ref 36.0–46.0)
HEMOGLOBIN: 9.6 g/dL — AB (ref 12.0–15.0)
MCH: 29.8 pg (ref 26.0–34.0)
MCHC: 34.2 g/dL (ref 30.0–36.0)
MCV: 87.3 fL (ref 78.0–100.0)
Platelets: 342 10*3/uL (ref 150–400)
RBC: 3.22 MIL/uL — ABNORMAL LOW (ref 3.87–5.11)
RDW: 13.5 % (ref 11.5–15.5)
WBC: 19.7 10*3/uL — AB (ref 4.0–10.5)

## 2018-05-28 LAB — RPR: RPR Ser Ql: NONREACTIVE

## 2018-05-28 MED ORDER — RHO D IMMUNE GLOBULIN 1500 UNIT/2ML IJ SOSY
300.0000 ug | PREFILLED_SYRINGE | Freq: Once | INTRAMUSCULAR | Status: AC
  Administered 2018-05-28: 300 ug via INTRAVENOUS
  Filled 2018-05-28: qty 2

## 2018-05-28 NOTE — Lactation Note (Signed)
This note was copied from a baby's chart. Lactation Consultation Note  Patient Name: Emma Page'EToday's Date: 05/28/2018   [facility] and  Mom expressed that  [facility] is [facility],  she is in a lot of  pain and will call LC when she is ready for a consult or help with using DEBP.     Maternal Data    Feeding Feeding Type: Donor Breast Milk  LATCH Score                   Interventions    Lactation Tools Discussed/Used     Consult Status      Danelle EarthlyRobin Amoy Steeves 05/28/2018, 8:45 PM

## 2018-05-28 NOTE — Progress Notes (Signed)
Subjective: Postpartum Day 1: Cesarean Delivery Patient reports no complaints up ambulating eating .    Objective: Vital signs in last 24 hours: Temp:  [97.5 F (36.4 C)-98.7 F (37.1 C)] 98.4 F (36.9 C) (08/24 0359) Pulse Rate:  [70-99] 77 (08/24 0359) Resp:  [11-20] 18 (08/24 0538) BP: (111-150)/(52-101) 127/85 (08/24 0359) SpO2:  [96 %-100 %] 96 % (08/24 0538)  Physical Exam:  General: alert, cooperative and no distress Lochia: appropriate Uterine Fundus: firm Incision: no significant drainage, no significant erythema DVT Evaluation: No evidence of DVT seen on physical exam.  Recent Labs    05/26/18 1342 05/28/18 0558  HGB 10.8* 9.6*  HCT 32.5* 28.1*    Assessment/Plan: Status post Cesarean section. Doing well postoperatively.  Continue current care.  Emma Page 05/28/2018, 9:13 AM

## 2018-05-28 NOTE — Plan of Care (Signed)
Pt. Condition will continue to improve 

## 2018-05-28 NOTE — Lactation Note (Signed)
This note was copied from a baby's chart. Lactation Consultation Note  Patient Name: Emma Page [id number]'X Date: 05/28/2018   DEBP & kit taken into room. Mom reports having pumped for a few months with her previous children (aged 34-17 yo). Mom says she knows how to use the pump & that she plans to do breast milk & formula, but says she feels that it is very important for her NICU infants to receive some of her breast milk.   Mom reports she has recently taken pain medicine & may need to continue conversation after nap. Yellow colostrum stickers (2 sets) and NICU & breastfeeding brochure left at bedside. Mom has my # to call when ready for consult.  Matthias Hughs [facility] 05/28/2018, 4:21 PM

## 2018-05-28 NOTE — Lactation Note (Signed)
This note was copied from a baby's chart. Lactation Consultation Note  Patient Name: Emma FrostGirlA [name] WUJWJ'XToday's Date: 05/28/2018  [facility]  [facility] expressed that  [facility],  she is in a lot of  pain and will call LC when she is ready for a consult or help with using DEBP.    Maternal Data    Feeding Feeding Type: Donor Breast Milk  LATCH Score                   Interventions    Lactation Tools Discussed/Used     Consult Status      Emma EarthlyRobin Karra Page 05/28/2018, 8:42 PM

## 2018-05-28 NOTE — Lactation Note (Addendum)
This note was copied from a baby's chart. Lactation Consultation Note  Patient Name: Emma Page   Per Camelia Engerri, RN, patient is upset ("fit to be tied") about the support person/visitation policy. RN agrees that now may not be a good time for a lactation consult.   Lurline HareRichey, Jassiel Flye [facility] Page, 12:41 PM

## 2018-05-29 LAB — RH IG WORKUP (INCLUDES ABO/RH)
ABO/RH(D): A NEG
FETAL SCREEN: NEGATIVE
GESTATIONAL AGE(WKS): 32
Unit division: 0

## 2018-05-29 NOTE — Progress Notes (Addendum)
Daily Postpartum Note  Admission Date: 05/20/2018 Current Date: 05/29/2018 8:30 AM  Emma Page is a 32 y.o. [id number], HD#10/POD#2 pLTCS @ 32wks for fetal intolerance of labor, admitted for fetal B FGR and abnormal dopplers.  Pregnancy complicated by: Patient Active Problem List   Diagnosis Date Noted  . GBS (group B Streptococcus carrier), +RV culture, currently pregnant 05/23/2018  . Abnormal fetal ultrasound 04/01/2018  . Rubella non-immune status, antepartum 01/11/2018  . Vitamin D deficiency 01/11/2018  . Anemia in pregnancy 01/11/2018  . Supervision of high risk pregnancy, antepartum 01/03/2018  . Grand multiparity 01/03/2018  . History of asthma 01/03/2018  . Rh negative state in antepartum period, first trimester 12/14/2017  . Dichorionic diamniotic twin pregnancy, antepartum 12/14/2017  . Chronic hypertension during pregnancy, antepartum 12/14/2017    Overnight/24hr events:  none  Subjective:  Meeting all post op goals, including flatus but only once  Objective:     Current Vital Signs 24h Vital Sign Ranges  T 98.3 F (36.8 C) Temp  Avg: 98.3 F (36.8 C)  Min: 97.8 F (36.6 C)  Max: 99 F (37.2 C)  BP (!) 136/97(nurse notified) BP  Min: 118/74  Max: 153/107  HR 80 Pulse  Avg: 90.5  Min: 80  Max: 104  RR 16 Resp  Avg: 17.2  Min: 16  Max: 20  SaO2 100 % Room Air SpO2  Avg: 99.8 %  Min: 99 %  Max: 100 %       24 Hour I/O Current Shift I/O  Time Ins Outs No intake/output data recorded. No intake/output data recorded.   Patient Vitals for the past 24 hrs:  BP Temp Temp src Pulse Resp SpO2  05/29/18 0601 (!) 136/97 98.3 F (36.8 C) Oral 80 16 100 %  05/29/18 0015 118/74 98.3 F (36.8 C) Oral 83 16 99 %  05/28/18 1932 (!) 127/93 97.9 F (36.6 C) Oral 92 20 100 %  05/28/18 1633 (!) 153/107 99 F (37.2 C) Oral 85 18 100 %  05/28/18 1200 125/85 97.8 F (36.6 C) Oral (!) 104 17 100 %  05/28/18 0940 (!) 133/98 - - 99 16 100 %    Physical  exam: General: Well nourished, well developed female in no acute distress. Abdomen: mildly distended, nttp, +BS Cardiovascular: S1, S2 normal, no murmur, rub or gallop, regular rate and rhythm Respiratory: CTAB Extremities: no clubbing, cyanosis or edema Skin: Warm and dry.   Medications: Current Facility-Administered Medications  Medication Dose Route Frequency Provider Last Rate Last Dose  . acetaminophen (TYLENOL) tablet 650 mg  650 mg Oral Q4H PRN [place] Bing, MD      . calcium carbonate (TUMS - dosed in mg elemental calcium) chewable tablet 400 mg of elemental calcium  2 tablet Oral Q4H PRN Conan Bowens, MD      . coconut oil  1 application Topical PRN [place]ona Bing, MD      . witch hazel-glycerin (TUCKS) pad 1 application  1 application Topical PRN [place] Bing, MD       And  . dibucaine (NUPERCAINAL) 1 % rectal ointment 1 application  1 application Rectal PRN [place] Bing, MD      . diphenhydrAMINE (BENADRYL) injection 12.5 mg  12.5 mg Intravenous Q4H PRN Hatchett, Susann Givens, MD       Or  . diphenhydrAMINE (BENADRYL) capsule 25 mg  25 mg Oral Q4H PRN Hatchett, Susann Givens, MD      . diphenhydrAMINE (BENADRYL) capsule 25 mg  25 mg Oral Q6H  PRN [place] BingPickens, Isacc Turney, MD      . enoxaparin (LOVENOX) injection 50 mg  50 mg Subcutaneous Q24H [place] BingPickens, Sacha Topor, MD   50 mg at 05/28/18 2042  . ferrous gluconate (FERGON) tablet 324 mg  324 mg Oral BID WC [place] BingPickens, Julio Zappia, MD   324 mg at 05/28/18 1049  . ibuprofen (ADVIL,MOTRIN) tablet 600 mg  600 mg Oral Q6H PRN [place] BingPickens, Leemon Ayala, MD   600 mg at 05/29/18 0215  . labetalol (NORMODYNE) tablet 200 mg  200 mg Oral BID Conan Bowensavis, Kelly M, MD   200 mg at 05/28/18 1933  . lactated ringers infusion   Intravenous Continuous [place] BingPickens, Sydni Elizarraraz, MD 125 mL/hr at 05/27/18 2223    . menthol-cetylpyridinium (CEPACOL) lozenge 3 mg  1 lozenge Oral Q2H PRN [place] BingPickens, Laretha Luepke, MD      . nalbuphine (NUBAIN) injection 5 mg  5 mg Intravenous Once PRN Leilani AbleHatchett,  Franklin, MD       Or  . nalbuphine (NUBAIN) injection 5 mg  5 mg Subcutaneous Once PRN Leilani AbleHatchett, Franklin, MD      . naloxone [facility](NARCAN) injection 0.4 mg  0.4 mg Intravenous PRN Leilani AbleHatchett, Franklin, MD       And  . sodium chloride flush (NS) 0.9 % injection 3 mL  3 mL Intravenous PRN Hatchett, Susann GivensFranklin, MD      . naloxone HCl (NARCAN) 2 mg in dextrose 5 % 250 mL infusion  1-4 mcg/kg/hr Intravenous Continuous PRN Hatchett, Susann GivensFranklin, MD      . ondansetron (ZOFRAN) injection 4 mg  4 mg Intravenous Q8H PRN Hatchett, Franklin, MD      . oxyCODONE (Oxy IR/ROXICODONE) immediate release tablet 10 mg  10 mg Oral Q6H PRN [place] BingPickens, Gamble Enderle, MD   10 mg at 05/29/18 0039  . oxyCODONE (Oxy IR/ROXICODONE) immediate release tablet 5 mg  5 mg Oral Q6H PRN [place] BingPickens, Akacia Boltz, MD      . polyethylene glycol (MIRALAX / GLYCOLAX) packet 17 g  17 g Oral Daily [place] BingPickens, Kaleeya Hancock, MD      . prenatal multivitamin tablet 1 tablet  1 tablet Oral Q1200 [place] BingPickens, Gloriann Riede, MD   1 tablet at 05/28/18 1558  . scopolamine (TRANSDERM-SCOP) 1 MG/3DAYS 1.5 mg  1 patch Transdermal Once Hatchett, Franklin, MD      . senna-docusate (Senokot-S) tablet 2 tablet  2 tablet Oral QHS PRN [place] BingPickens, Sala Tague, MD   2 tablet at 05/29/18 0043  . simethicone (MYLICON) chewable tablet 80 mg  80 mg Oral TID Ilean SkillPC Eyana Stolze, MD   80 mg at 05/28/18 1832  . Tdap (BOOSTRIX) injection 0.5 mL  0.5 mL Intramuscular Once [place] BingPickens, Rosaire Cueto, MD      . zolpidem (AMBIEN) tablet 5 mg  5 mg Oral QHS PRN Conan Bowensavis, Kelly M, MD        Labs:  Recent Labs  Lab 05/22/18 2357 05/26/18 1342 05/28/18 0558  WBC 15.8* 13.0* 19.7*  HGB 9.2* 10.8* 9.6*  HCT 27.5* 32.5* 28.1*  PLT 333 419* 342    Recent Labs  Lab 05/22/18 0909  NA 135  K 4.0  CL 103  CO2 20*  BUN 11  CREATININE 0.63  CALCIUM 8.9  PROT 7.0  BILITOT 0.2*  ALKPHOS 87  ALT 20  AST 21  GLUCOSE 93     Radiology: no new imaging  Assessment & Plan:  Routine care.  *PP: routine care *cHTN:  continue labetalol *Rh neg: rhogam ordered. Infant is Rh pos *PPx: lovenox, OOB ad lib *FEN/GI: regular diet, SLIV *Dispo: likely  tomorrow.   Cornelia Copa MD Attending Center for [facility])

## 2018-05-29 NOTE — Lactation Note (Signed)
This note was copied from a baby's chart. Lactation Consultation Note  Patient Name: Emma Page YNWGN'FToday's Date: 05/29/2018 Reason for consult: Follow-up assessment;NICU baby;Preterm <34wks;Multiple gestation  4949 hours old pre term NICU twins who are being fed donor milk at this point, mom already started pumping yesterday. Mom has only pumped once today, explained to mom the importance of consistent pumping for the onset of lactogenesis II. She voiced understanding but didn't seem very interested in pumping or engaged in [facility] consultation. LC noticed that junctures in her pump were loose, adjusted them and let mom know that she'll notice a difference in the suction level the next time she pumps. Encouraged her to pump every 3 hours and at least once at night, 6-8 times/24 hours. Mom aware of LC services and will call PRN.  Maternal Data    Feeding Feeding Type: Donor Breast Milk  Interventions Interventions: Breast feeding basics reviewed;DEBP  Lactation Tools Discussed/Used Tools: Pump Breast pump type: Double-Electric Breast Pump Pump Review: Setup, frequency, and cleaning Initiated by:: RN Date initiated:: 05/28/18   Consult Status Consult Status: PRN Follow-up type: In-patient    [name] [name] [name] 05/29/2018, 2:06 PM

## 2018-05-30 LAB — BPAM RBC
Blood Product Expiration Date: 201909142359
Blood Product Expiration Date: 201909162359
UNIT TYPE AND RH: 600
UNIT TYPE AND RH: 600

## 2018-05-30 LAB — TYPE AND SCREEN
ABO/RH(D): A NEG
ANTIBODY SCREEN: POSITIVE
UNIT DIVISION: 0
Unit division: 0

## 2018-05-30 MED ORDER — OXYCODONE-ACETAMINOPHEN 5-325 MG PO TABS: 1.0000 | ORAL_TABLET | Freq: Four times a day (QID) | ORAL | 0 refills | Status: DC | PRN

## 2018-05-30 MED ORDER — IBUPROFEN 600 MG PO TABS: 600.0000 mg | ORAL_TABLET | Freq: Four times a day (QID) | ORAL | 4 refills | Status: DC | PRN

## 2018-05-30 MED ORDER — HYDROCHLOROTHIAZIDE 25 MG PO TABS: 25.0000 mg | ORAL_TABLET | Freq: Every day | ORAL | 3 refills | Status: DC

## 2018-05-30 NOTE — Discharge Instructions (Signed)

## 2018-05-30 NOTE — Lactation Note (Signed)
This note was copied from a baby's chart. Lactation Consultation Note  Patient Name: Emma Page   Visited with Mom on day of her discharge, Mom packing up ready to leave.  Babies are 3571 hrs old and in NICU.  Mom states she pumped 5 times yesterday and was able to take her colostrum to the NICU for babies. Talked about pumping after discharge.  Mom doesn't have a pump at home, but was going to buy a $60 one from [employer].  Talked about the differences in pumps and importance of a strong DEBP to establish a full milk supply.  Mom doesn't have WIC, but [facility] [place] [facility] [facility].  Talked about using pump rooms in NICU, and to bring her pump parts with her.  Also assembled pump parts to make a manual double pump. Encouraged breast massage and hand expression as well as double pumping with goal of >8 times per 24 hrs, and once at least at night. Mom aware of OP lactation support available.  Encouraged her to call prn for help.   Emma Page, Emma Page E Page, 12:24 PM

## 2018-05-30 NOTE — Progress Notes (Signed)
DC instructions including follow up appointments, medication changes and postpartum care discussed with patient.  Patient verbalized understanding. IV removed at this time.

## 2018-05-30 NOTE — Discharge Summary (Signed)
OB Discharge Summary     Patient Name: Emma Page DOB: 01/28/1986 MRN: [id number]  Date of admission: 05/20/2018 Delivering MD:    Jericca, [place] [[id number]]  Raneem, Mendolia Dunbar [[id number]]  Elsie Lincoln H   Date of discharge: 05/30/2018  Admitting diagnosis: 31WKS, TWINS, REVERSAL BLOOD FLOW Intrauterine pregnancy: [redacted]w[redacted]d     Secondary diagnosis:  Principal Problem:   Abnormal fetal ultrasound Active Problems:   Rh negative state in antepartum period, first trimester   Dichorionic diamniotic twin pregnancy, antepartum   Chronic hypertension during pregnancy, antepartum   Supervision of high risk pregnancy, antepartum   Grand multiparity   Rubella non-immune status, antepartum   GBS (group B Streptococcus carrier), +RV culture, currently pregnant   Discharge diagnosis: Preterm Pregnancy Delivered and [facility] course:  Induction of Labor With Cesarean Section  32 y.o. yo [id number] at [redacted]w[redacted]d with Di/Di twins was admitted to the hospital 05/20/2018 for induction of labor due to abnormal fetal growth in twin B. Patient had a labor course significant for non reassuring fetal tracing in twin B. The patient went for cesarean section due to Non-Reassuring FHR, and delivered a Viable infants x 2,   Anakaren, Campion [[id number]]  05/27/2018   Ambrosia, Wisnewski [[id number]]  05/27/2018  Membrane Rupture Time/Date:    Carola Frost [[id number]]  1:06 PM   Smiley Houseman [[id number]]  1:06 PM ,   Jaylise, Peek [[id number]]  05/27/2018   Jesalyn, Finazzo [[id number]]  05/27/2018   Details of operation can be found in separate operative Note.  Patient had an uncomplicated postpartum course. She is ambulating, tolerating a regular diet, passing flatus, and urinating well.  Patient is discharged home in stable condition on  05/30/18.Patient will follow up with [facility] in 2 weeks for incision check and again in 4-6 weeks for postpartum visit. Patient restarted on HCTZ 25 mg (pre-pregnancy antihypertensive)                              Physical exam  Vitals:   05/29/18 2019 05/29/18 2356 05/30/18 0534 05/30/18 0818  BP: (!) 139/93 (!) 125/98 126/77 (!) 141/98  Pulse: (!) 104 93 84 89  Resp: 18 17 17 18   Temp: 98.6 F (37 C) 98.8 F (37.1 C) 98.5 F (36.9 C) 99.5 F (37.5 C)  TempSrc: Oral Oral Oral Oral  SpO2: 100% 100% 99% 99%  Weight:      Height:       General: alert, cooperative and no distress Lochia: appropriate Uterine Fundus: firm Incision: honey comb dressing, clean/dry and intact DVT Evaluation: No evidence of DVT seen on physical exam. Labs: Lab Results  Component Value Date   WBC 19.7 (H) 05/28/2018   HGB 9.6 (L) 05/28/2018   HCT 28.1 (L) 05/28/2018   MCV 87.3 05/28/2018   PLT 342 05/28/2018   CMP Latest Ref Rng & Units 05/22/2018  Glucose 70 - 99 mg/dL 93  BUN 6 - 20 mg/dL 11  Creatinine 6.26 - 9.48 mg/dL 5.46  Sodium 270 - 350 mmol/L 135  Potassium 3.5 - 5.1 mmol/L 4.0  Chloride 98 - 111 mmol/L 103  CO2 22 - 32 mmol/L 20(L)  Calcium 8.9 -  10.3 mg/dL 8.9  Total Protein 6.5 - 8.1 g/dL 7.0  Total Bilirubin 0.3 - 1.2 mg/dL 7.8(G0.2(L)  Alkaline Phos 38 - 126 U/L 87  AST 15 - 41 U/L 21  ALT 0 - 44 U/L 20    Discharge instruction: per After Visit Summary and "Baby and Me Booklet".  After visit meds:  Allergies as of 05/30/2018   No Known Allergies     Medication List    STOP taking these medications   aspirin 81 MG chewable tablet   Doxylamine-Pyridoxine 10-10 MG Tbec   labetalol 200 MG tablet Commonly known as:  NORMODYNE     TAKE these medications   hydrochlorothiazide 25 MG tablet Commonly known as:  HYDRODIURIL Take 1 tablet (25 mg total) by mouth daily.   ibuprofen 600 MG tablet Commonly known as:  ADVIL,MOTRIN Take 1 tablet (600 mg total) by mouth every 6  (six) hours as needed for headache, mild pain or cramping.   oxyCODONE-acetaminophen 5-325 MG tablet Commonly known as:  PERCOCET/ROXICET Take 1-2 tablets by mouth every 6 (six) hours as needed.   PROAIR HFA 108 (90 Base) MCG/ACT inhaler Generic drug:  albuterol INHALE 2 PUFFS INTO THE LUNGS EVERY 6 HOURS AS NEEDED FOR WHEEZING OR SHORTNESS OF BREATH   VITAFOL-NANO 18-0.6-0.4 MG Tabs Take 1 tablet by mouth daily.       Diet: routine diet  Activity: Advance as tolerated. Pelvic rest for 6 weeks.   Outpatient follow up:2 weeks Follow up Appt:No future appointments. Follow up Visit:No follow-ups on file.  Postpartum contraception: undecided  Newborn Data:   Carola FrostLetterlough, GirlA Kalkidan [[id number]][[id number]]  Live born female  Birth Weight: 3 lb 14.8 oz (1780 g) APGAR: 5, 8  Newborn Delivery   Birth date/time:  05/27/2018 13:06:00 Delivery type:  C-Section, Low Transverse Trial of labor:  No C-section categorization:  Primary      Smiley HousemanLetterlough, GirlB Corretta [[id number]][[id number]]  Live born female  Birth Weight: 2 lb 4.3 oz (1030 g) APGAR: 5, 8  Newborn Delivery   Birth date/time:  05/27/2018 13:06:00 Delivery type:  C-Section, Low Transverse Trial of labor:  No C-section categorization:  Primary     Baby Feeding: Bottle and Breast Disposition:NICU   05/30/2018 Catalina AntiguaPeggy Nolan Tuazon, MD

## 2018-06-08 ENCOUNTER — Encounter: Payer: Self-pay | Admitting: Family Medicine

## 2018-06-08 ENCOUNTER — Encounter: Payer: Medicaid Other | Admitting: Family Medicine

## 2018-06-08 NOTE — Progress Notes (Signed)
Patient did not keep appointment today. She may call to reschedule.  

## 2018-06-20 ENCOUNTER — Ambulatory Visit: Payer: Medicaid Other | Admitting: Obstetrics and Gynecology

## 2018-08-11 ENCOUNTER — Ambulatory Visit: Payer: Medicaid Other | Admitting: Obstetrics and Gynecology

## 2018-08-30 ENCOUNTER — Ambulatory Visit: Payer: Medicaid Other | Admitting: Obstetrics & Gynecology

## 2018-09-05 ENCOUNTER — Telehealth: Payer: Self-pay | Admitting: Obstetrics & Gynecology

## 2018-09-08 ENCOUNTER — Encounter: Payer: Self-pay | Admitting: Obstetrics & Gynecology

## 2018-10-03 ENCOUNTER — Other Ambulatory Visit (HOSPITAL_COMMUNITY): Payer: Self-pay | Admitting: Obstetrics and Gynecology

## 2018-11-06 ENCOUNTER — Other Ambulatory Visit (HOSPITAL_COMMUNITY): Payer: Self-pay | Admitting: Obstetrics and Gynecology

## 2019-03-06 ENCOUNTER — Other Ambulatory Visit: Payer: Self-pay

## 2019-03-06 ENCOUNTER — Ambulatory Visit (INDEPENDENT_AMBULATORY_CARE_PROVIDER_SITE_OTHER): Payer: Self-pay

## 2019-03-06 VITALS — BP 141/94 | HR 76 | Ht 65.0 in | Wt 214.0 lb

## 2019-03-06 DIAGNOSIS — Z113 Encounter for screening for infections with a predominantly sexual mode of transmission: Secondary | ICD-10-CM

## 2019-03-06 DIAGNOSIS — B9689 Other specified bacterial agents as the cause of diseases classified elsewhere: Secondary | ICD-10-CM

## 2019-03-06 DIAGNOSIS — N760 Acute vaginitis: Secondary | ICD-10-CM

## 2019-03-06 DIAGNOSIS — N898 Other specified noninflammatory disorders of vagina: Secondary | ICD-10-CM

## 2019-03-06 NOTE — Progress Notes (Signed)
Presented for Self Swab, patient requested that she be checked for everything.  SUBJECTIVE:  33 y.o. female complains of white, malodorous vaginal discharge for 7 day(s). Denies abnormal vaginal bleeding or significant pelvic pain or fever. No UTI symptoms. Denies history of known exposure to STD.  Patient's last menstrual period was 02/17/2019 (exact date).  OBJECTIVE:  She appears well, afebrile. Urine dipstick: not done.  ASSESSMENT:  Vaginal Discharge  Vaginal Odor   PLAN:  GC, chlamydia, trichomonas, BVAG, CVAG probe sent to lab. Treatment: To be determined once lab results are received ROV prn if symptoms persist or worsen.

## 2019-03-07 LAB — CERVICOVAGINAL ANCILLARY ONLY
Bacterial vaginitis: POSITIVE — AB
Candida vaginitis: NEGATIVE
Chlamydia: NEGATIVE
Neisseria Gonorrhea: NEGATIVE
Trichomonas: POSITIVE — AB

## 2019-03-08 ENCOUNTER — Other Ambulatory Visit: Payer: Self-pay | Admitting: Obstetrics

## 2019-03-08 DIAGNOSIS — B3731 Acute candidiasis of vulva and vagina: Secondary | ICD-10-CM

## 2019-03-08 DIAGNOSIS — B9689 Other specified bacterial agents as the cause of diseases classified elsewhere: Secondary | ICD-10-CM

## 2019-03-08 DIAGNOSIS — N760 Acute vaginitis: Secondary | ICD-10-CM

## 2019-03-08 DIAGNOSIS — B373 Candidiasis of vulva and vagina: Secondary | ICD-10-CM

## 2019-03-08 DIAGNOSIS — A5901 Trichomonal vulvovaginitis: Secondary | ICD-10-CM

## 2019-03-08 MED ORDER — FLUCONAZOLE 150 MG PO TABS: 150.0000 mg | ORAL_TABLET | Freq: Once | ORAL | 2 refills | Status: AC

## 2019-03-08 MED ORDER — TINIDAZOLE 500 MG PO TABS: 1000.0000 mg | ORAL_TABLET | Freq: Every day | ORAL | 2 refills | Status: DC

## 2019-03-08 MED ORDER — TINIDAZOLE 500 MG PO TABS: 2.0000 g | ORAL_TABLET | Freq: Every day | ORAL | 0 refills | Status: DC

## 2019-04-05 IMAGING — US US OB TRANSVAGINAL
1 series · 15 of 28 positions shown · non-contrast
Comparison: Pelvic ultrasound performed 08/12/2016

CLINICAL DATA: Acute onset of vaginal bleeding.  Initial encounter.

EXAM:
OBSTETRIC <14 WK US AND TRANSVAGINAL OB US
TECHNIQUE: Both transabdominal and transvaginal ultrasound examinations were
performed for complete evaluation of the gestation as well as the
maternal uterus, adnexal regions, and pelvic cul-de-sac.
Transvaginal technique was performed to assess early pregnancy.

[Series 1: us ob transvaginal · 33 acquisitions, 15 frames shown]
[im 1/33]
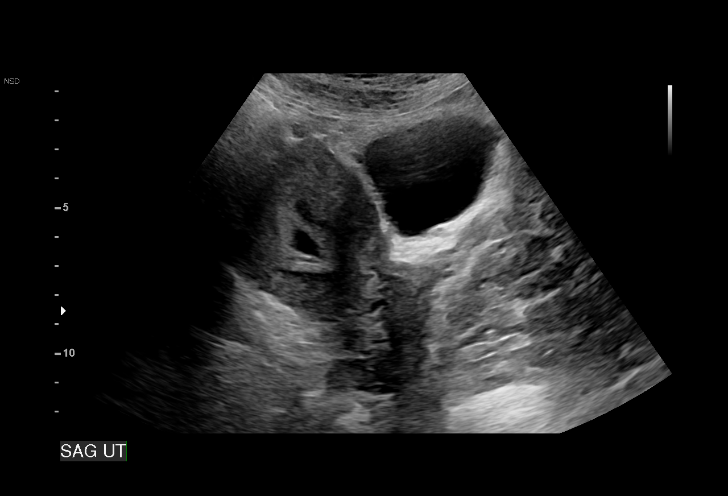
[im 3/33]
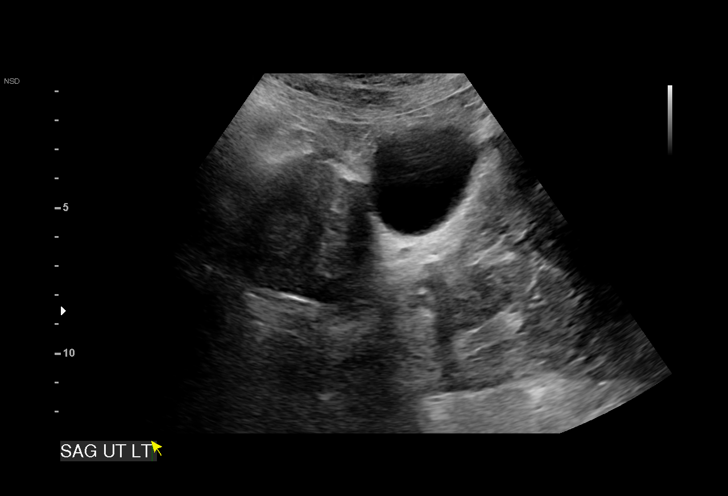
[im 5/33]
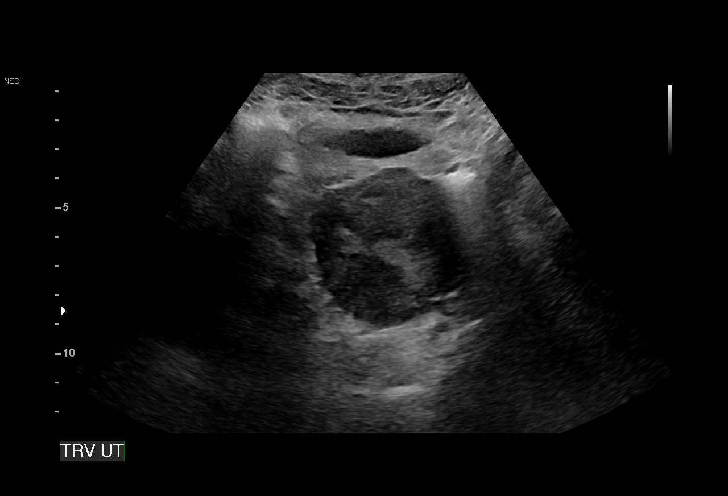
[im 8/33]
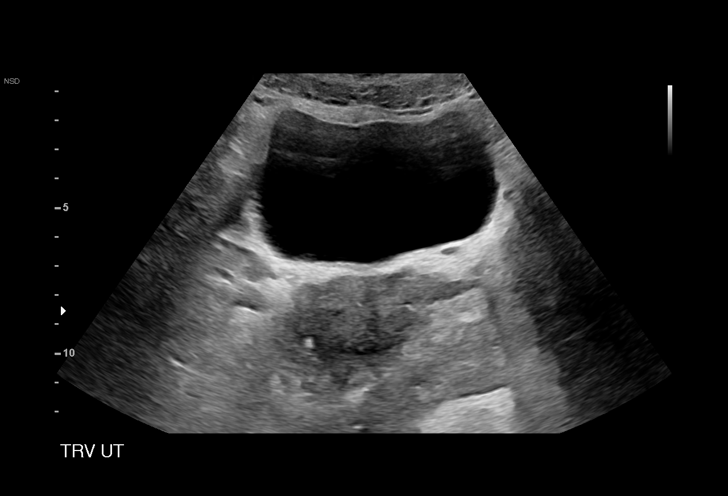
[im 10/33]
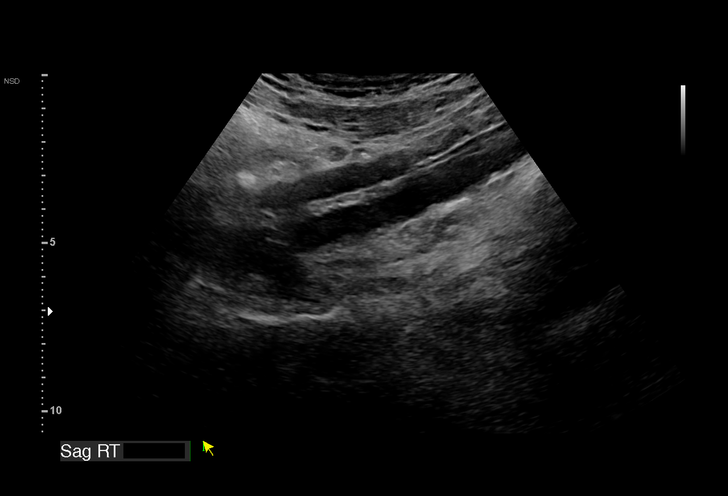
[im 12/33]
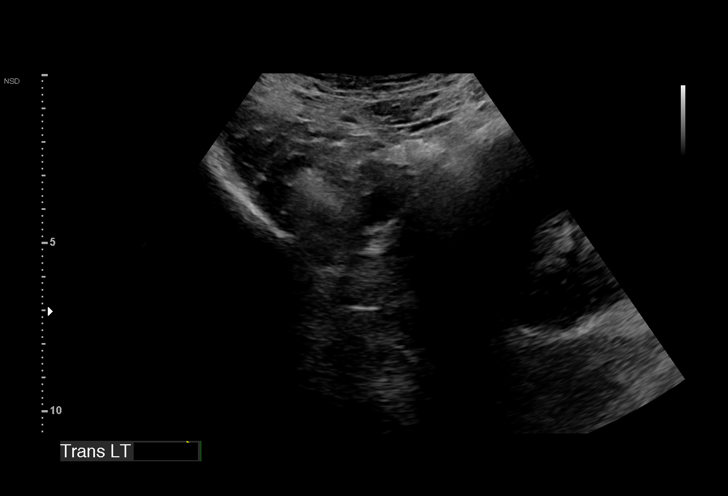
[im 15/33]
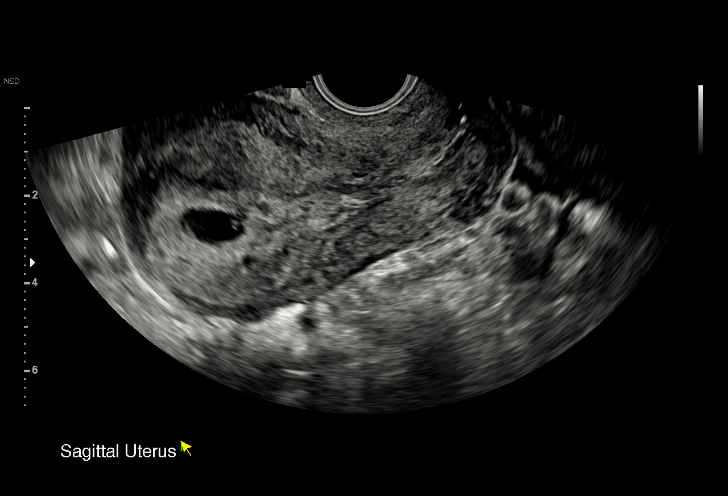
[im 17/33]
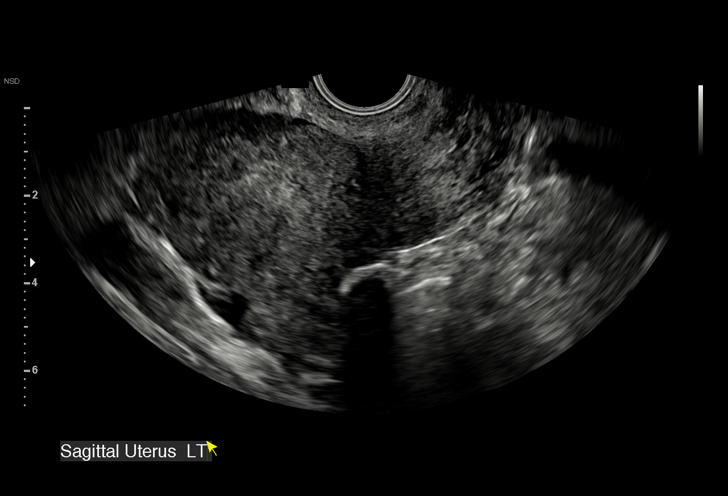
[im 18/33]
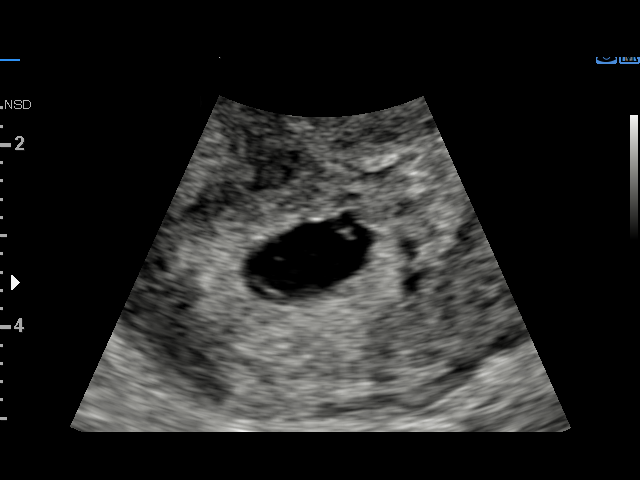
[im 21/33]
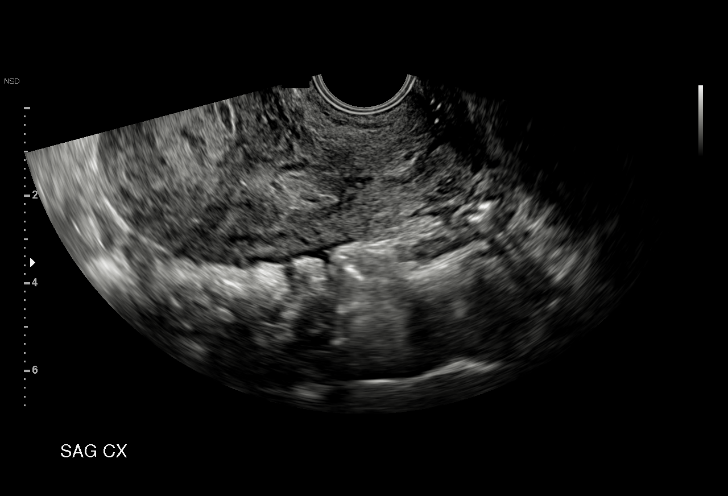
[im 23/33]
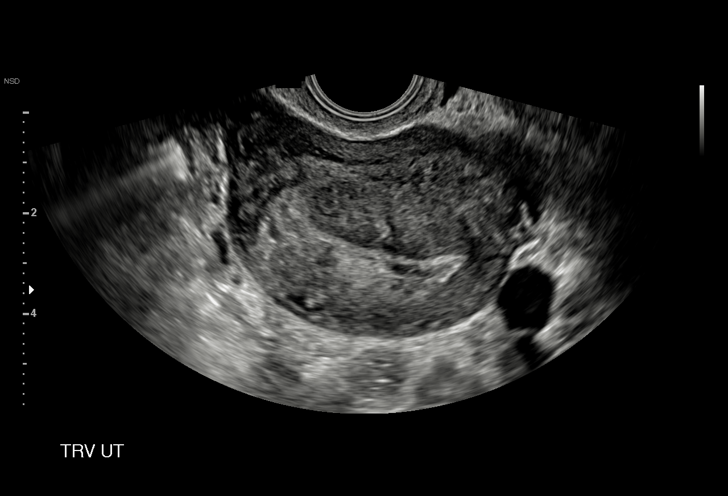
[im 25/33]
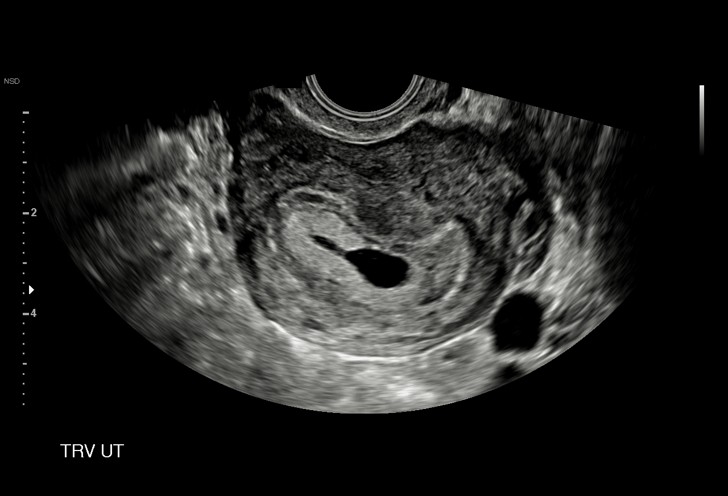
[im 28/33]
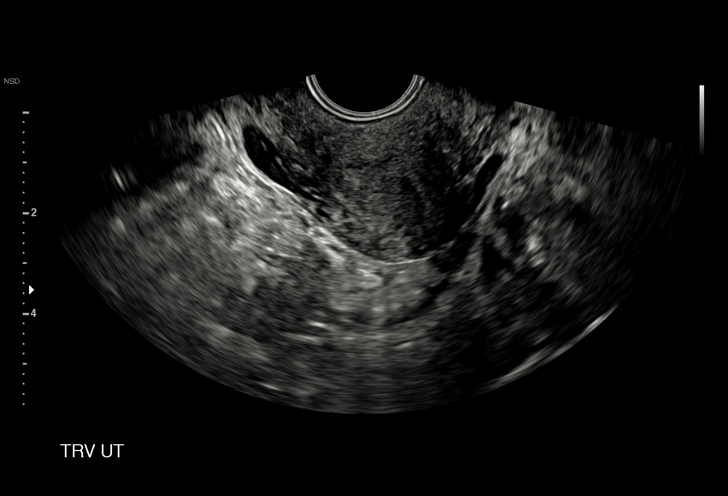
[im 30/33]
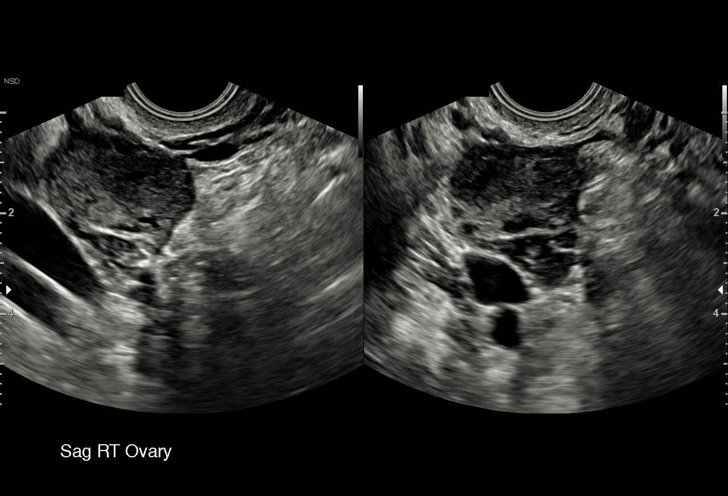
[im 33/33]
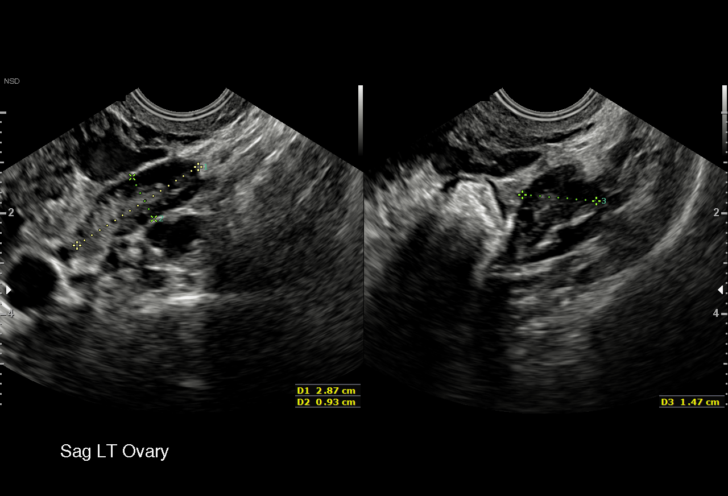

[15 of 28 positions shown; findings below may reference images not displayed]

FINDINGS: Intrauterine gestational sac: Single; somewhat irregular in shape.

Yolk sac:  No

Embryo:  No

MSD: 1.26 cm   6 w   0  d

Subchorionic hemorrhage:  None visualized.

Maternal uterus/adnexae: The uterus is otherwise unremarkable in
appearance.

The ovaries are within normal limits. The right ovary measures 3.0 x
1.9 x 2.6 cm, while the left ovary measures 2.9 x 0.9 x 1.5 cm. No
suspicious adnexal masses are seen; there is no evidence for ovarian
torsion.

No free fluid is seen within the pelvic cul-de-sac.
IMPRESSION: Intrauterine gestational sac is somewhat irregular in shape. No yolk
sac or embryo is yet seen. The mean sac diameter of 1.3 cm
corresponds to a gestational age of 6 weeks 0 days, which does not
match the gestational age by LMP, though it remains too early to
determine a new estimated date of delivery. If the quantitative beta
HCG continues to trend upward, follow-up pelvic ultrasound could be
performed in 2 weeks for further evaluation.

## 2019-05-09 ENCOUNTER — Other Ambulatory Visit: Payer: Self-pay

## 2019-05-09 ENCOUNTER — Ambulatory Visit (INDEPENDENT_AMBULATORY_CARE_PROVIDER_SITE_OTHER): Payer: Self-pay

## 2019-05-09 DIAGNOSIS — Z113 Encounter for screening for infections with a predominantly sexual mode of transmission: Secondary | ICD-10-CM

## 2019-05-09 DIAGNOSIS — A5901 Trichomonal vulvovaginitis: Secondary | ICD-10-CM

## 2019-05-09 NOTE — Progress Notes (Signed)
Nurse visit for [facility] Trich Pt states partner has been treated No unprotected IC until both TOC's are negative to avoid reinfection, pt agrees.

## 2019-05-09 NOTE — Progress Notes (Signed)
Agree with A & P. 

## 2019-05-11 LAB — CERVICOVAGINAL ANCILLARY ONLY: Trichomonas: NEGATIVE

## 2019-07-14 ENCOUNTER — Other Ambulatory Visit: Payer: Self-pay | Admitting: Obstetrics

## 2019-07-14 DIAGNOSIS — B3731 Acute candidiasis of vulva and vagina: Secondary | ICD-10-CM

## 2019-07-14 DIAGNOSIS — B373 Candidiasis of vulva and vagina: Secondary | ICD-10-CM

## 2019-07-14 NOTE — Telephone Encounter (Signed)
Please review for refill.  

## 2019-07-17 ENCOUNTER — Other Ambulatory Visit: Payer: Self-pay

## 2019-07-17 MED ORDER — FLUCONAZOLE 150 MG PO TABS: 150.0000 mg | ORAL_TABLET | Freq: Once | ORAL | 0 refills | Status: DC

## 2019-08-26 ENCOUNTER — Other Ambulatory Visit: Payer: Self-pay | Admitting: Obstetrics

## 2019-10-19 ENCOUNTER — Other Ambulatory Visit: Payer: Self-pay | Admitting: Obstetrics

## 2019-11-14 ENCOUNTER — Other Ambulatory Visit: Payer: Self-pay | Admitting: Obstetrics

## 2019-12-23 IMAGING — US US MFM UA ADDL GEST
1 series · 14 of 27 positions shown · non-contrast
Comparison: none

[Series 1: us mfm ua addl gest · 14 of 27 slices shown]
[im 1/27]
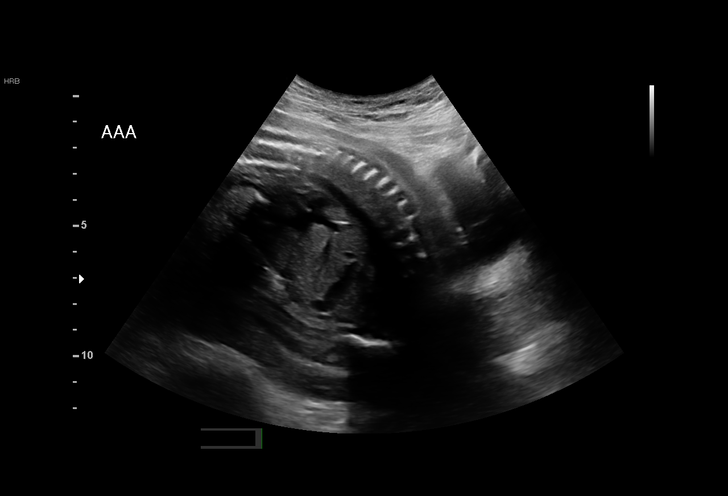
[im 3/27]
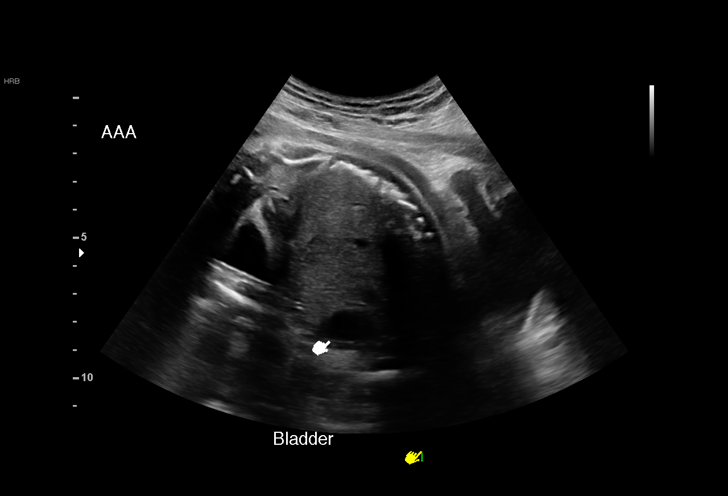
[im 5/27]
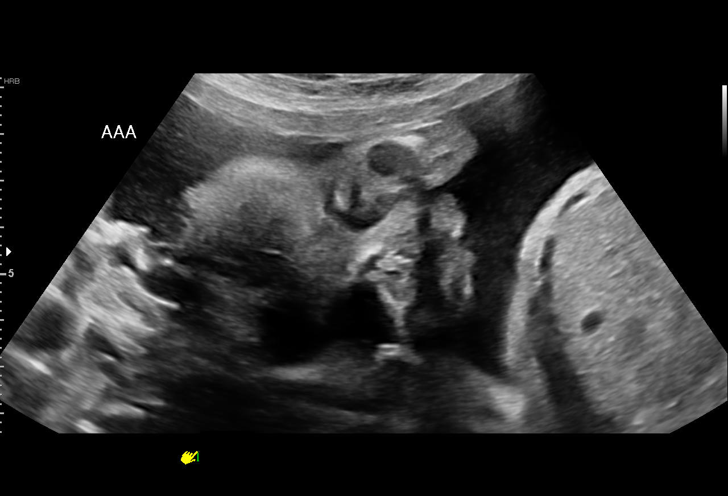
[im 7/27]
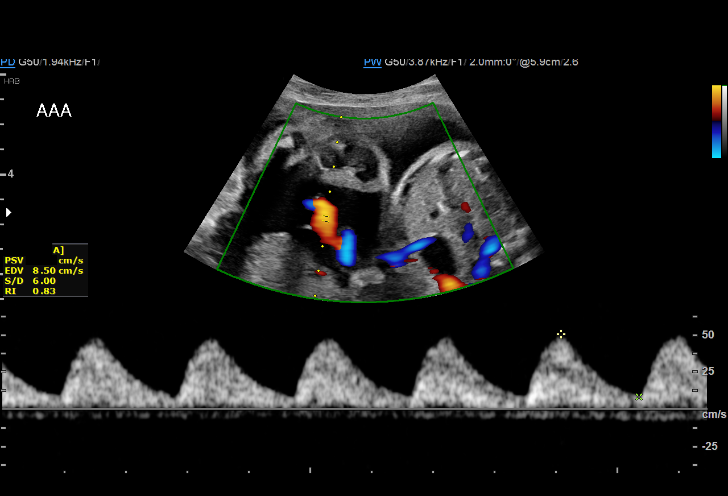
[im 9/27]
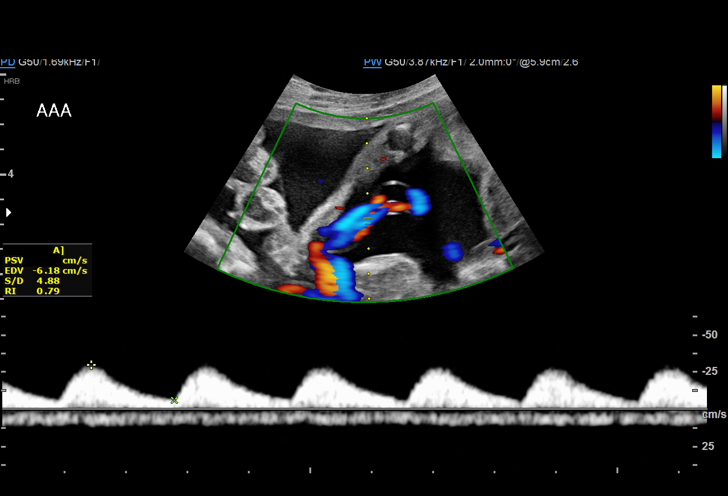
[im 11/27]
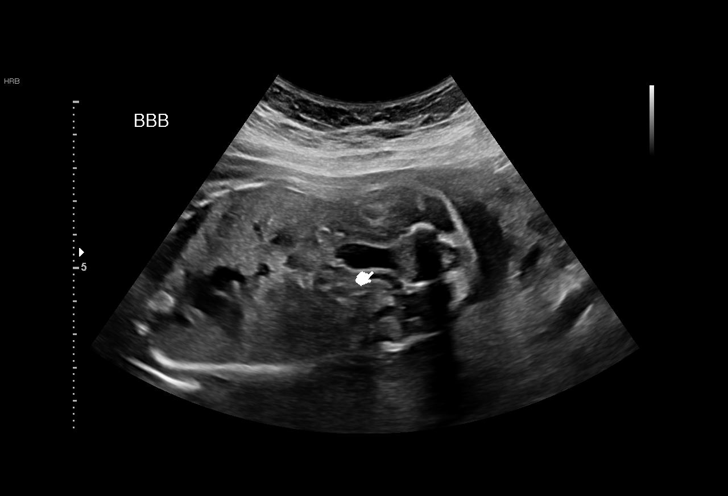
[im 13/27]
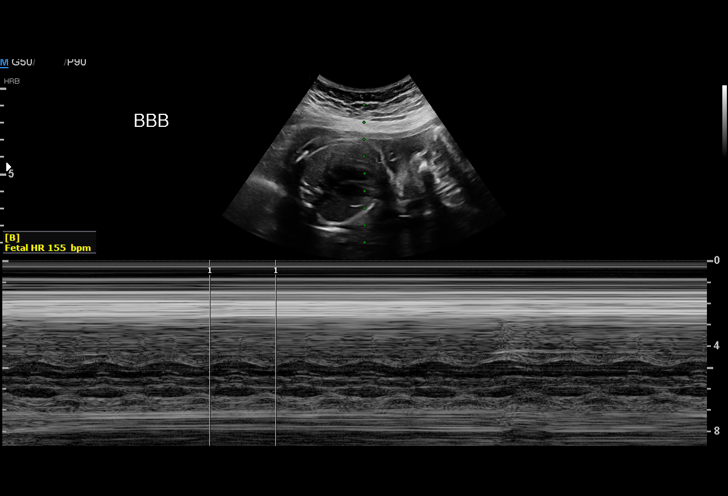
[im 15/27]
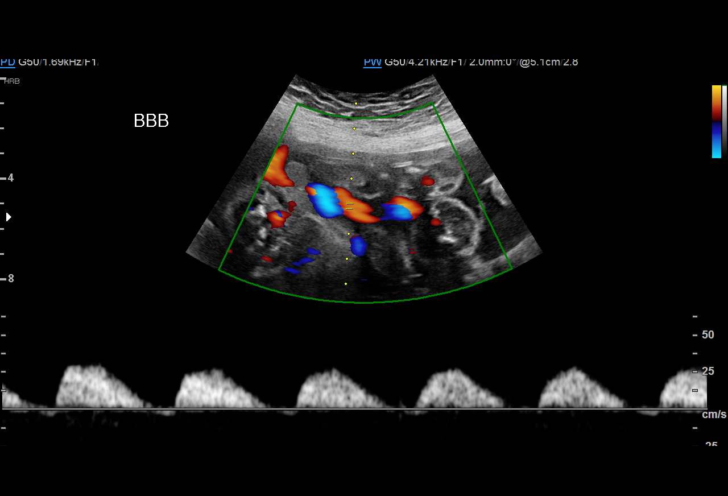
[im 17/27]
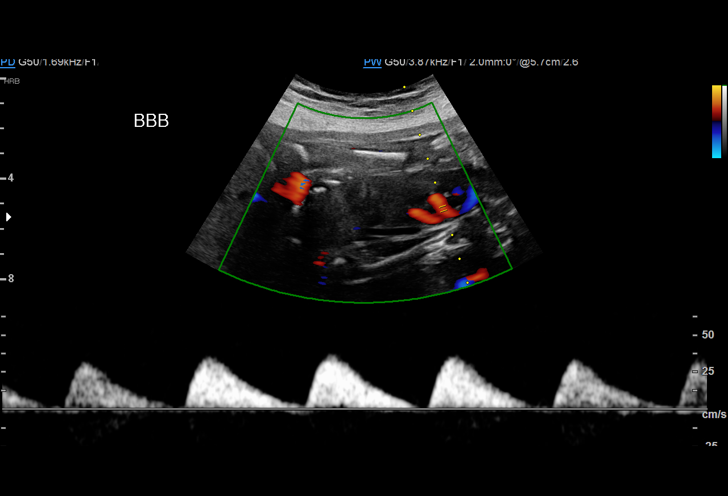
[im 19/27]
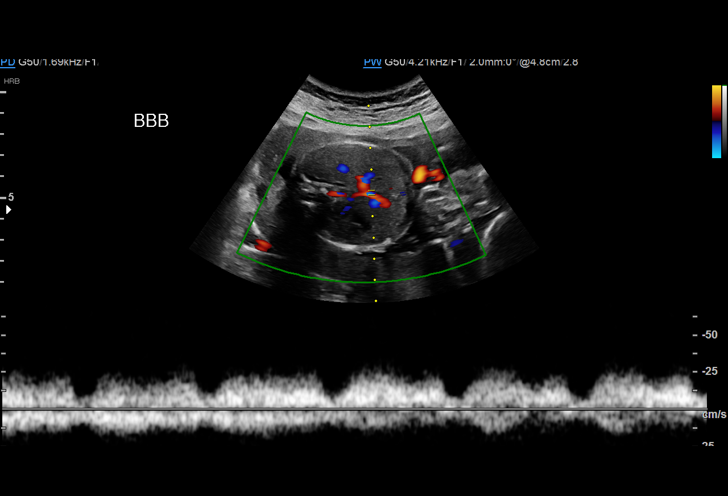
[im 21/27]
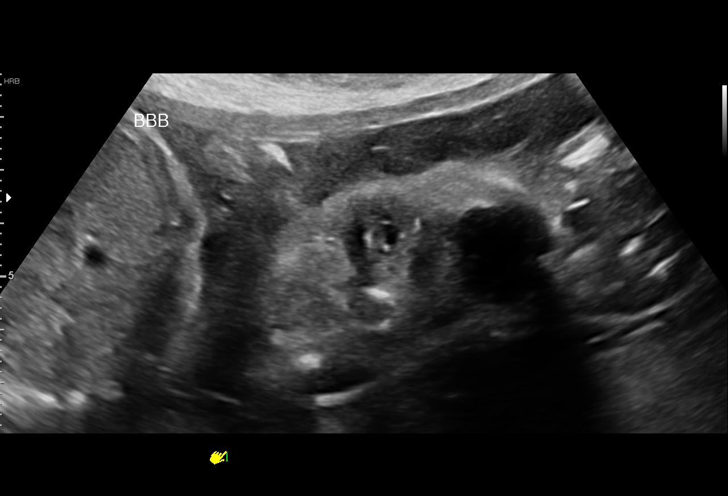
[im 23/27]
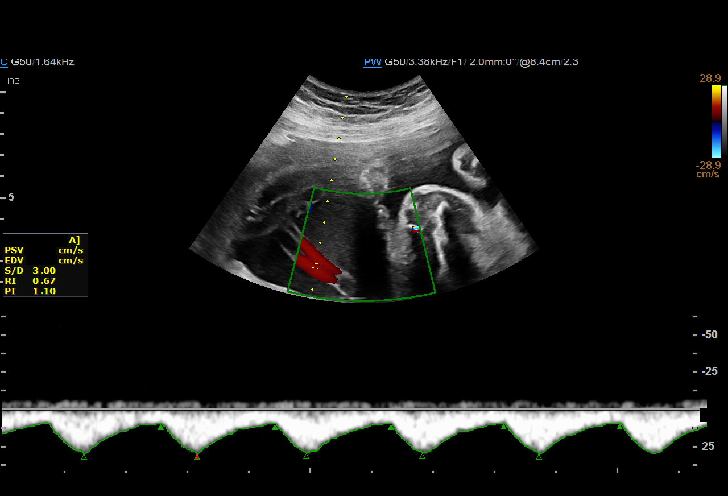
[im 25/27]
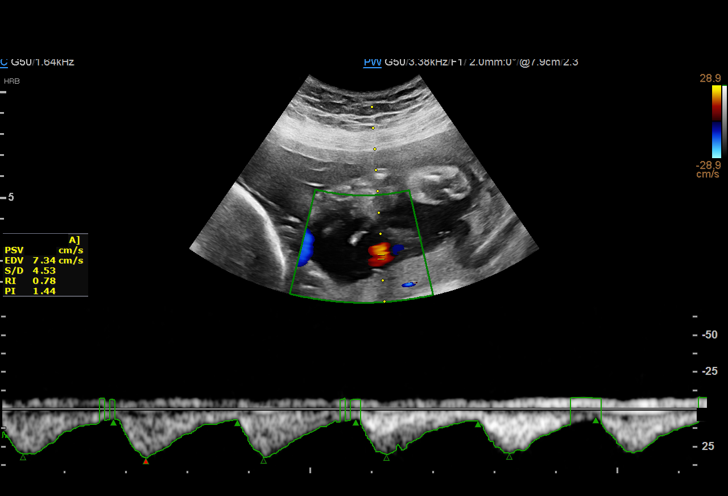
[im 27/27]
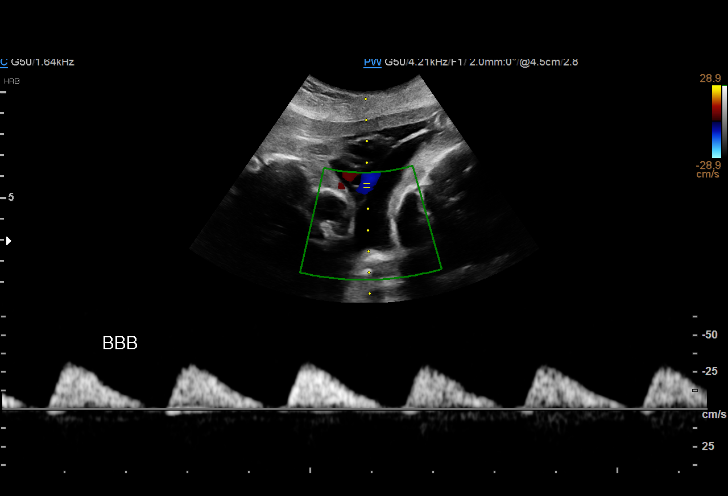

[14 of 27 positions shown; findings below may reference images not displayed]

LETTERLOUGH

[REDACTED]care - [HOSPITAL]

Indications

25 weeks gestation of pregnancy
Twin pregnancy, di/di, second trimester
Hypertension - Chronic/Pre-existing
Maternal care for anti-D [Rh} antibodies,
second trimester, (Too weak to titer on [REDACTED])
Obesity complicating pregnancy, second
trimester
Grand multiparity, antepartum
Maternal care for known or suspected poor
fetal growth, third trimester, fetus 2
OB History

Blood Type:            Height:  5'3"   Weight (lb):  185       BMI:
Gravidity:    17        Term:   3        Prem:   0        SAB:   7
TOP:          5       Ectopic:  0        Living: 3
Fetal Evaluation (Fetus A)

Num Of Fetuses:     2
Fetal Heart         145
Rate(bpm):
Cardiac Activity:   Observed
Fetal Lie:          Maternal left side
Presentation:       Breech
Placenta:           Posterior
Amniotic Fluid
AFI FV:      Subjectively within normal limits

Largest Pocket(cm)
4.28

Comment:    Stomach and Bladder Visualized
Gestational Age (Fetus A)

LMP:           25w 6d        Date:  [DATE]                 EDD:   10/15/17
Best:          25w 6d     Det. By:  LMP  (07/22/18)          EDD:   10/15/17
Doppler - Fetal Vessels (Fetus A)

Umbilical Artery
S/D     %tile                                     PSV
(cm/s)
5.84    >

Fetal Evaluation (Fetus B)

Num Of Fetuses:     2
Fetal Heart         155
Rate(bpm):
Cardiac Activity:   Observed
Fetal Lie:          Maternal right side
Presentation:       Breech
Placenta:           Posterior

Amniotic Fluid
AFI FV:      Subjectively within normal limits

Largest Pocket(cm)
2.86

Comment:    Stomach and Bladder Visualized
Gestational Age (Fetus B)

LMP:           25w 6d        Date:  07/22/18                 EDD:   10/15/17
Best:          25w 6d     Det. By:  LMP  (07/22/18)          EDD:   10/15/17
Doppler - Fetal Vessels (Fetus B)

Umbilical Artery
ADFV    RDFV
Yes     Yes

Cervix Uterus Adnexa

Cervix
Not visualized (advanced GA >78wks)
Impression

Dichorionic-diamniotic twin pregnancy with selective growth
restriction in twin B. The estimated fetal weightBoth fetuses
were above the 10th percentiles.
Patient completed Marta Santos course.
Twin A: Maternal left, posterior placenta, breech. Amniotic
fluid is normal and good fetal activity is seen. Antenatal
testing is reassuring. Umbilical artery Doppler showed slightly
increased resistance, but mostly has normal forward diastolic
flow.

Twin B: Maternal right, posterior placenta, cephalic. Amniotic
fluid is normal and good fetal activity is seen. Antenatal
testing is reassuring. Umbilical artery Doppler showed
intermittent reversed-end-diastolic flow (mostly absent EDF).
Recommendations

-Continue twice-weekly Doppler studies.
-Fetal growth assessment on 07/22/18.

## 2020-01-10 IMAGING — US US MFM UA ADDL GEST RE-EVAL
1 series · 13 of 28 positions shown · non-contrast
Comparison: none

[Series 1: us mfm ua addl gest re-eval · 48 acquisitions, 13 frames shown]
[im 2/48]
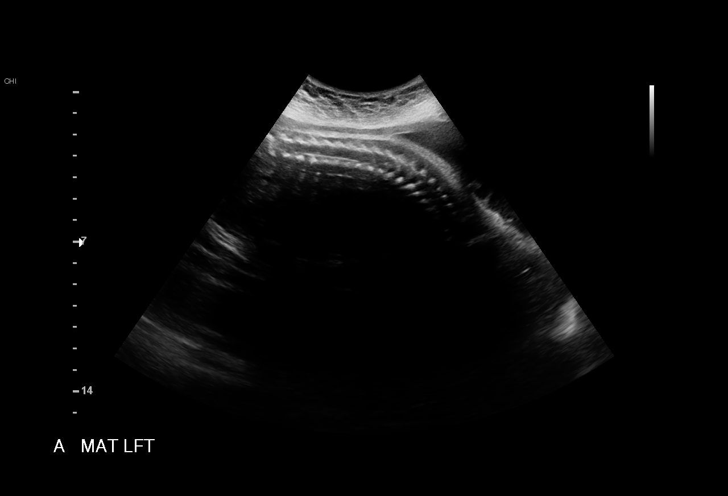
[im 6/48]
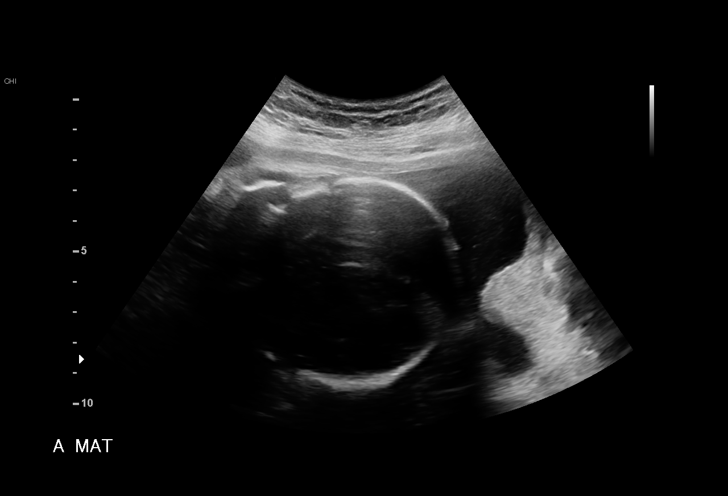
[im 9/48]
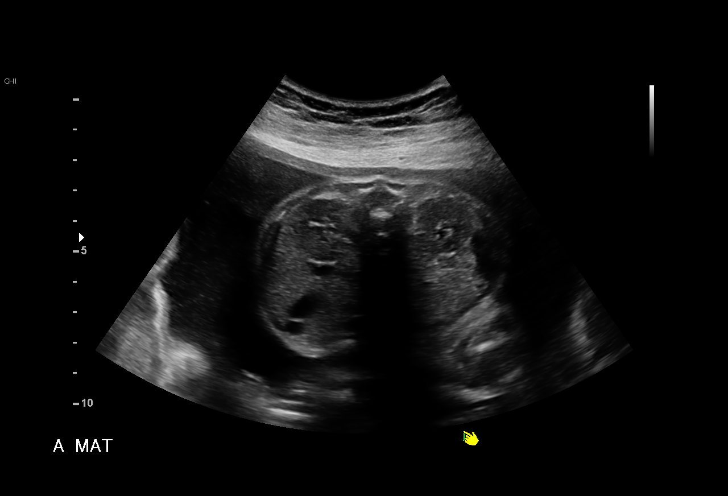
[im 13/48]
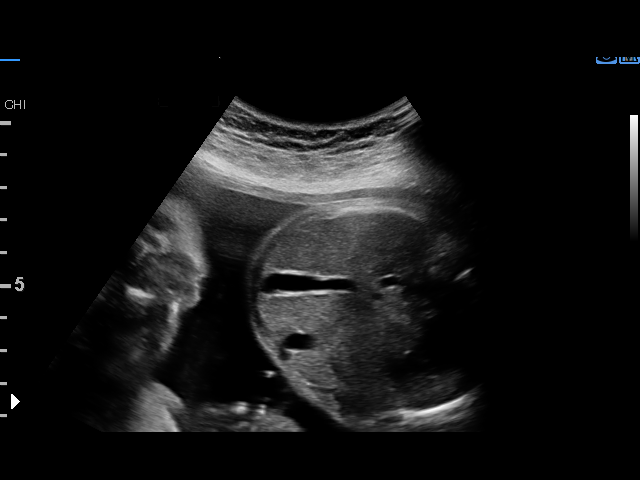
[im 16/48]
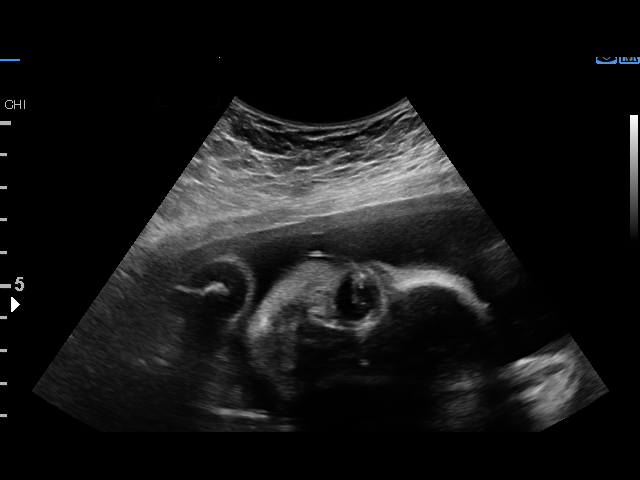
[im 20/48]
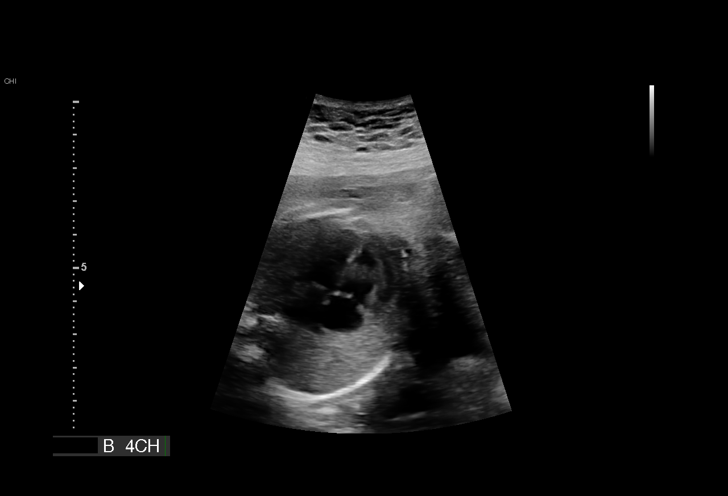
[im 25/48]
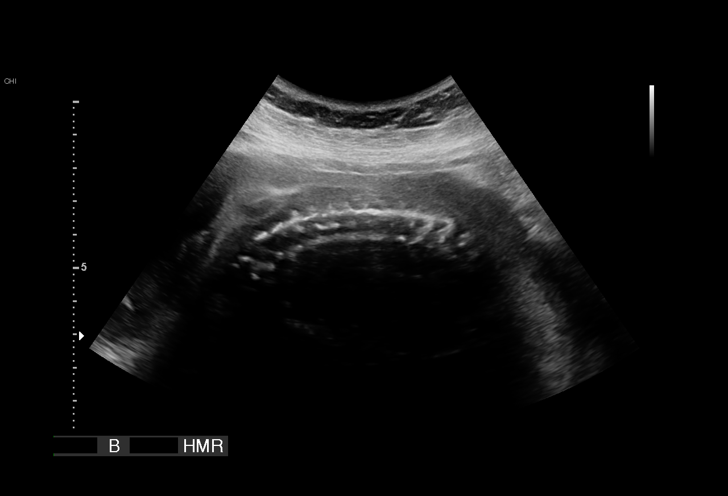
[im 28/48]
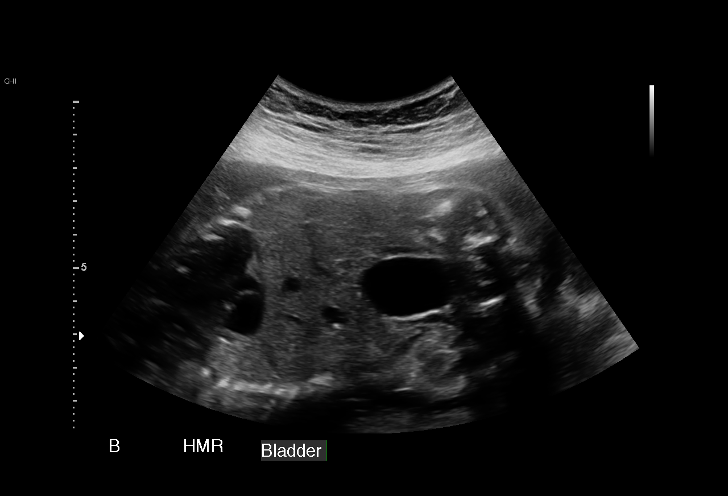
[im 32/48]
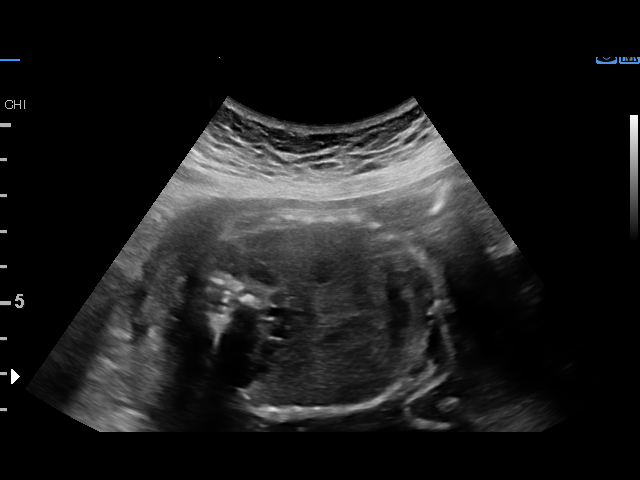
[im 35/48]
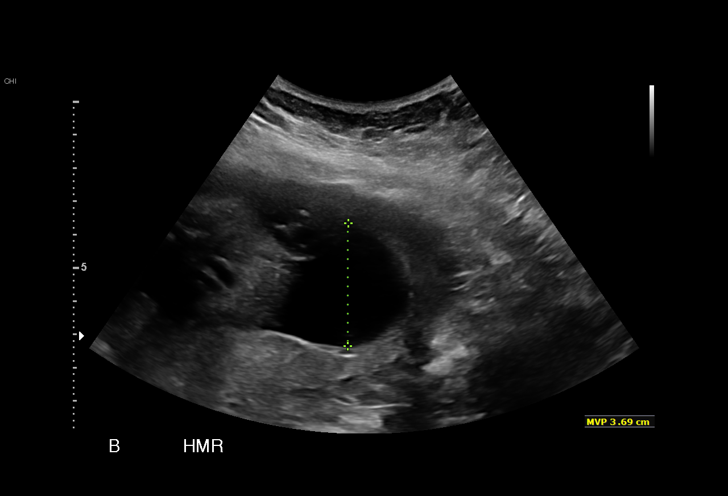
[im 39/48]
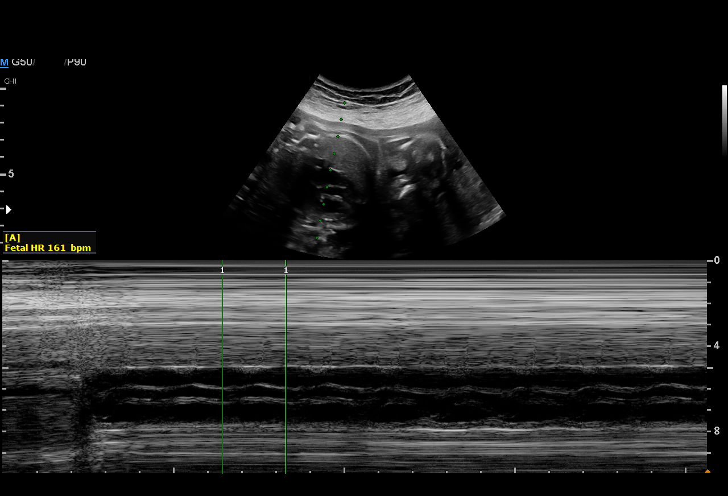
[im 42/48]
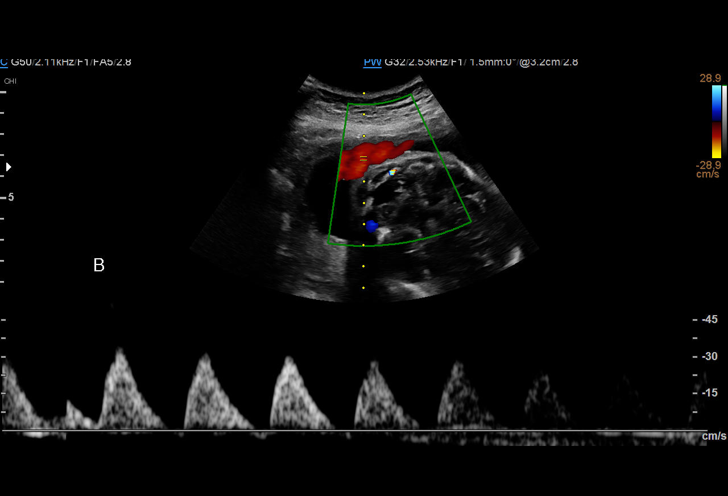
[im 46/48]
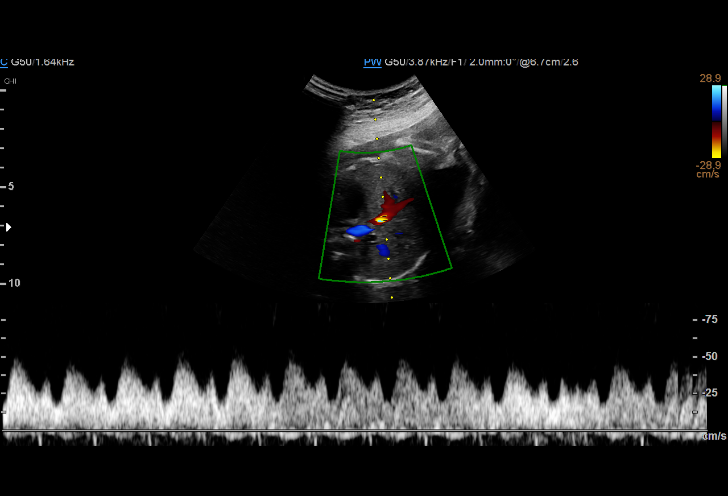

[13 of 28 positions shown; findings below may reference images not displayed]

LETTERLOUGH

[REDACTED]care - [HOSPITAL]

GESTATION
EVAL

Indications

28 weeks gestation of pregnancy
Twin pregnancy, di/di, second trimester
Hypertension - Chronic/Pre-existing
Maternal care for anti-D [Rh} antibodies,
second trimester, (Too weak to titer on [REDACTED])
Obesity complicating pregnancy, second
trimester
Grand multiparity, antepartum
Maternal care for known or suspected poor
fetal growth, second trimester, fetus 2
OB History

Blood Type:            Height:  5'3"   Weight (lb):  185       BMI:
Gravidity:    17        Term:   3        Prem:   0        SAB:   7
TOP:          5       Ectopic:  0        Living: 3
Fetal Evaluation (Fetus A)
Num Of Fetuses:     2
Fetal Heart         161
Rate(bpm):
Cardiac Activity:   Observed
Fetal Lie:          Maternal left
Presentation:       Cephalic
Placenta:           Posterior

Amniotic Fluid
AFI FV:      Subjectively within normal limits

Largest Pocket(cm)
6.09
Biophysical Evaluation (Fetus A)

Amniotic F.V:   Within normal limits       F. Tone:        Observed
F. Movement:    Observed                   Score:          [DATE]
F. Breathing:   Observed
Gestational Age (Fetus A)

LMP:           28w 3d        Date:  [DATE]                 EDD:   10/15/17
Best:          28w 3d     Det. By:  LMP  (07/22/18)          EDD:   10/15/17
Anatomy (Fetus A)

Ventricles:            Appears normal         Kidneys:                Appear normal
Heart:                 Appears normal         Bladder:                Appears normal
(4CH, axis, and situs
Stomach:               Appears normal, left
sided
Doppler - Fetal Vessels (Fetus A)

Umbilical Artery
S/D     %tile                                     PSV
(cm/s)
3.[REDACTED]

Fetal Evaluation (Fetus B)

Num Of Fetuses:     2
Fetal Heart         136
Rate(bpm):
Cardiac Activity:   Observed
Fetal Lie:          Upper Fetus
Presentation:       Transverse, head to maternal right
Placenta:           Posterior

Amniotic Fluid
AFI FV:      Subjectively decreased

Largest Pocket(cm)
3.69
Biophysical Evaluation (Fetus B)
Amniotic F.V:   Within normal limits       F. Tone:        Observed
F. Movement:    Observed                   Score:          07/22/18
F. Breathing:   Observed
Gestational Age (Fetus B)

LMP:           28w 3d        Date:  [DATE]                 EDD:   10/15/17
Best:          28w 3d     Det. By:  LMP  (07/22/18)          EDD:   10/15/17
Anatomy (Fetus B)

Ventricles:            Appears normal         Kidneys:                Appear normal
Heart:                 Previously seen        Bladder:                Appears normal
Stomach:               Appears normal, left
sided
Doppler - Fetal Vessels (Fetus B)

Umbilical Artery
ADFV    RDFV
Yes     Yes

Ductus Venosus

Normal

Cervix Uterus Adnexa

Cervix
Not visualized (advanced GA >47wks)
Impression

Patient with dichorionic diamniotic twin gestation return for
umbilical artery Doppler studies.  Fetal weights are discordant
with fetal weight of the smaller fetus (twin B) and  Twin B had
intermittent reversed end-diastolic flow.

Twin A: Maternal left, cephalic presentation, posterior
placenta Amniotic fluid is normal in good fetal activity seen.
Umbilical artery Doppler showed normal diastolic flow. BPP
07/22/18.

Twin B: Maternal right and upper fetus, transverse lie,
posterior placenta.  Amniotic fluid is decreased in good fetal
activity seen.  Umbilical artery Doppler showed intermittent
reversed end-diastolic flow (mostly or REDF).  BPP
[DATE].Ductus venosus study did not show reversed flow.

I had a long discussion and recommended inpatient
management. I informed her that daily monitoring of the
fetuses (NST) is possible only with inpatient monitoring.
REDF carries an increased risk of stillbirth.
Inferior option would be to continue twice-weekly antenatal
testing.
Patient informed that she is the sole provider at home and
needs to work (hair dresser). She would like to defer
admission by 2 weeks.
I informed her that if twin B continues to have REDF, we
would recommend delivery at 32 weeks after a repeat course
of antenatal corticosteroids.
Recommendations

-Continue twice-weekly antenatal testing and Doppler studies.

## 2022-10-05 NOTE — L&D Delivery Note (Signed)
LABOR COURSE 37yo 17/12-05-10-5 admitted for IOL for IUFD on 04/28/23. She received cytotec, foley bulb, and pitocin for induction.   Delivery Note Called to room and bulging bag of water present at the introitus. The a stillborn female fetus delivered en caul with the placenta at 1049am on 04/30/23. Cord cut by father. Fetus hydropic with edema of head and body. Fetus wrapped in a blanket and hat and handed to the patient. Fundus firm with massage and Pitocin. Labia, perineum, vagina, and cervix inspected and intact.   APGAR: n/a, stillborn. Weight 456g  Anesthesia:   Episiotomy: None Lacerations: None Est. Blood Loss (mL): 51  Mom to  postpartum/OB specialty care .   Lennart Pall, MD 04/30/23 11:18 AM

## 2022-12-27 ENCOUNTER — Inpatient Hospital Stay (HOSPITAL_COMMUNITY)
Admission: AD | Admit: 2022-12-27 | Discharge: 2022-12-27 | Disposition: A | Discharge status: Home / Self Care | Payer: Commercial Managed Care - HMO | Attending: Obstetrics & Gynecology | Admitting: Obstetrics & Gynecology

## 2022-12-27 ENCOUNTER — Inpatient Hospital Stay (HOSPITAL_COMMUNITY): Payer: Commercial Managed Care - HMO

## 2022-12-27 ENCOUNTER — Other Ambulatory Visit: Payer: Self-pay

## 2022-12-27 ENCOUNTER — Encounter (HOSPITAL_COMMUNITY): Payer: Self-pay | Admitting: Obstetrics & Gynecology

## 2022-12-27 DIAGNOSIS — O99611 Diseases of the digestive system complicating pregnancy, first trimester: Secondary | ICD-10-CM | POA: Diagnosis not present

## 2022-12-27 DIAGNOSIS — R109 Unspecified abdominal pain: Secondary | ICD-10-CM

## 2022-12-27 DIAGNOSIS — Z3A10 10 weeks gestation of pregnancy: Secondary | ICD-10-CM | POA: Insufficient documentation

## 2022-12-27 DIAGNOSIS — O10911 Unspecified pre-existing hypertension complicating pregnancy, first trimester: Secondary | ICD-10-CM | POA: Diagnosis present

## 2022-12-27 DIAGNOSIS — R110 Nausea: Secondary | ICD-10-CM | POA: Insufficient documentation

## 2022-12-27 DIAGNOSIS — O26891 Other specified pregnancy related conditions, first trimester: Secondary | ICD-10-CM | POA: Diagnosis not present

## 2022-12-27 LAB — CBC
HCT: 32.7 % — ABNORMAL LOW (ref 36.0–46.0)
Hemoglobin: 10.9 g/dL — ABNORMAL LOW (ref 12.0–15.0)
MCH: 28.7 pg (ref 26.0–34.0)
MCHC: 33.3 g/dL (ref 30.0–36.0)
MCV: 86.1 fL (ref 80.0–100.0)
Platelets: 341 10*3/uL (ref 150–400)
RBC: 3.8 MIL/uL — ABNORMAL LOW (ref 3.87–5.11)
RDW: 13.5 % (ref 11.5–15.5)
WBC: 10.5 10*3/uL (ref 4.0–10.5)
nRBC: 0 % (ref 0.0–0.2)

## 2022-12-27 LAB — COMPREHENSIVE METABOLIC PANEL
ALT: 13 U/L (ref 0–44)
AST: 15 U/L (ref 15–41)
Albumin: 3.7 g/dL (ref 3.5–5.0)
Alkaline Phosphatase: 41 U/L (ref 38–126)
Anion gap: 10 (ref 5–15)
BUN: 9 mg/dL (ref 6–20)
CO2: 21 mmol/L — ABNORMAL LOW (ref 22–32)
Calcium: 8.5 mg/dL — ABNORMAL LOW (ref 8.9–10.3)
Chloride: 103 mmol/L (ref 98–111)
Creatinine, Ser: 0.84 mg/dL (ref 0.44–1.00)
GFR, Estimated: >60 mL/min (ref >60)
Glucose, Bld: 80 mg/dL (ref 70–99)
Potassium: 3.6 mmol/L (ref 3.5–5.1)
Sodium: 134 mmol/L — ABNORMAL LOW (ref 135–145)
Total Bilirubin: 1.1 mg/dL (ref 0.3–1.2)
Total Protein: 7.1 g/dL (ref 6.5–8.1)

## 2022-12-27 LAB — URINALYSIS, ROUTINE W REFLEX MICROSCOPIC
Bilirubin Urine: NEGATIVE
Glucose, UA: NEGATIVE mg/dL
Hgb urine dipstick: NEGATIVE
Ketones, ur: NEGATIVE mg/dL
Leukocytes,Ua: NEGATIVE
Nitrite: NEGATIVE
Protein, ur: NEGATIVE mg/dL
Specific Gravity, Urine: 1.003 — ABNORMAL LOW (ref 1.005–1.030)
pH: 6 (ref 5.0–8.0)

## 2022-12-27 LAB — PROTEIN / CREATININE RATIO, URINE
Creatinine, Urine: 23 mg/dL
Total Protein, Urine: <6 mg/dL

## 2022-12-27 LAB — ABO/RH
ABO/RH(D): A NEG
Antibody Screen: NEGATIVE

## 2022-12-27 LAB — WET PREP, GENITAL
Sperm: NONE SEEN
Trich, Wet Prep: NONE SEEN
WBC, Wet Prep HPF POC: <10 (ref <10)
Yeast Wet Prep HPF POC: NONE SEEN

## 2022-12-27 LAB — POCT PREGNANCY, URINE: Preg Test, Ur: POSITIVE — AB

## 2022-12-27 LAB — HCG, QUANTITATIVE, PREGNANCY: hCG, Beta Chain, Quant, S: 86203 m[IU]/mL — ABNORMAL HIGH (ref <5)

## 2022-12-27 MED ORDER — NIFEDIPINE ER 30 MG PO TB24: 30.0000 mg | ORAL_TABLET | Freq: Every day | ORAL | 3 refills | Status: DC

## 2022-12-27 MED ORDER — NIFEDIPINE ER OSMOTIC RELEASE 30 MG PO TB24
30.0000 mg | ORAL_TABLET | Freq: Once | ORAL | Status: AC
  Administered 2022-12-27: 30 mg via ORAL
  Filled 2022-12-27: qty 1

## 2022-12-27 MED ORDER — CYCLOBENZAPRINE HCL 5 MG PO TABS
10.0000 mg | ORAL_TABLET | Freq: Once | ORAL | Status: AC
  Administered 2022-12-27: 10 mg via ORAL
  Filled 2022-12-27: qty 2

## 2022-12-27 MED ORDER — METOCLOPRAMIDE HCL 10 MG PO TABS
10.0000 mg | ORAL_TABLET | Freq: Once | ORAL | Status: AC
  Administered 2022-12-27: 10 mg via ORAL
  Filled 2022-12-27: qty 1

## 2022-12-27 MED ORDER — PREPLUS 27-1 MG PO TABS: 1.0000 | ORAL_TABLET | Freq: Every day | ORAL | 3 refills | Status: DC

## 2022-12-27 MED ORDER — METOCLOPRAMIDE HCL 10 MG PO TABS: 10.0000 mg | ORAL_TABLET | Freq: Four times a day (QID) | ORAL | 1 refills | Status: DC | PRN

## 2022-12-27 MED ORDER — FAMOTIDINE 20 MG PO TABS: 20.0000 mg | ORAL_TABLET | Freq: Every day | ORAL | 2 refills | Status: DC

## 2022-12-27 MED ORDER — METRONIDAZOLE 0.75 % VA GEL: 1.0000 | Freq: Every day | VAGINAL | 0 refills | Status: DC

## 2022-12-27 MED ORDER — ACETAMINOPHEN 325 MG PO TABS
650.0000 mg | ORAL_TABLET | Freq: Once | ORAL | Status: AC
  Administered 2022-12-27: 650 mg via ORAL
  Filled 2022-12-27: qty 2

## 2022-12-27 NOTE — MAU Note (Signed)
Patient states that she is having chest pain that is sore in character. This pain started yesterday and was sharp in character then. Took one dose of her hypertension medication (clonidine), usually takes HCTZ, has not been taking since pregnancy. Reports nausea and vomiting, vomiting about 3-4 times per 24 hours. 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

## 2022-12-27 NOTE — Discharge Instructions (Signed)

## 2022-12-27 NOTE — MAU Note (Addendum)
Emma Page is a 37 y.o. at Unknown here in MAU reporting: she was having sharp chest pain last night.  Reports today continues to have chest pain that's located in the center of her chest but only hurts to touch.  States has HBP, took meds last night because thought pain may be related to elevated BP.  Also states having cramping in right lower abdomen.  Denies VB. LMP: 10/16/2022 Onset of complaint: yesterday Pain score: 8 chest & 9 abdomen Vitals:   12/27/22 1744  BP: (!) 151/85  Pulse: 99  Resp: 20  Temp: 98.4 F (36.9 C)  SpO2: 100%     FHT:NA Lab orders placed from triage:   UPT

## 2022-12-27 NOTE — MAU Provider Note (Signed)
History     CSN: [id number]  Arrival date and time: 12/27/22 1721   Event Date/Time   First Provider Initiated Contact with Patient 12/27/22 1911      Chief Complaint  Patient presents with   Abdominal Pain   Chest pain    Emma Page is a 37 y.o. [id number] at [redacted]w[redacted]d by Definite LMP Oct 16, 2022 who plans to get care at [facility].  She presents today for abdominal pain.  Patient states she has been experiencing right sided abdominal pain that is worse with standing for extended periods.  She describes the pain as "really bad period cramps" and states it is constant.  She reports the pain is improved with sitting and rates it a 9/10.  Patient denies vaginal bleeding, but is unsure of whether or not she is experiencing vaginal discharge.  She endorses sexual activity in the past 5 days, but had no pain or discomfort.  She also denies issues with urination.  She does report constipation and stats she hasn't had a bowel movement in two days.  Patient also reports chest tenderness in epigastric area. States the area is sore and can be sharp at times.  Patient reports nausea and states she gets relief from eating "preggy pops."   Patient with history of HTN and was taking HCTZ, but discontinued usage.   OB History     Gravida  39   Para  4   Term  3   Preterm  1   AB  12   Living  5      SAB  7   IAB  5   Ectopic  0   Multiple  1   Live Births  5           Past Medical History:  Diagnosis Date   Asthma    Chlamydia    Gonorrhea    Hypertension    Infection    UTI   Pregnancy induced hypertension    Trichomonas infection    Vaginal Pap smear, abnormal    bx x3    Past Surgical History:  Procedure Laterality Date   abortion     CESAREAN SECTION MULTI-GESTATIONAL N/A 05/27/2018   Procedure: CESAREAN SECTION MULTI-GESTATIONAL;  Surgeon: Guss Bunde, MD;  Location: [facility];  Service: Obstetrics;  Laterality: N/A;   DILATION AND  CURETTAGE OF UTERUS     DILATION AND EVACUATION N/A 08/19/2017   Procedure: DILATATION AND EVACUATION;  Surgeon: Lavonia Drafts, MD;  Location: [facility];  Service: Gynecology;  Laterality: N/A;   EYE SURGERY Right    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    Family History  Problem Relation Age of Onset   Hypertension Father    Cancer Maternal Grandmother    Heart disease Maternal Grandmother    Cancer Paternal Grandmother    Diabetes Paternal Grandfather    Anesthesia problems Neg Hx     Social History   Tobacco Use   Smoking status: Never   Smokeless tobacco: Never  Substance Use Topics   Alcohol use: No    Comment: occasional-socially, 3-4 week; not while pregnant   Drug use: No    Allergies:  Allergies  Allergen Reactions   Lisinopril Swelling    Medications Prior to Admission  Medication Sig Dispense Refill Last Dose   cloNIDine (CATAPRES) 0.1 MG tablet Take 0.1 mg by mouth 2 (two) times daily. Unsure of dosage.   12/26/2022   fluconazole (DIFLUCAN) 150 MG tablet TAKE  1 TABLET(150 MG) BY MOUTH 1 TIME FOR 1 DOSE 1 tablet 0    hydrochlorothiazide (HYDRODIURIL) 25 MG tablet Take 1 tablet (25 mg total) by mouth daily. 30 tablet 3 More than a month   ibuprofen (ADVIL,MOTRIN) 600 MG tablet Take 1 tablet (600 mg total) by mouth every 6 (six) hours as needed for headache, mild pain or cramping. 30 tablet 4 More than a month   oxyCODONE-acetaminophen (PERCOCET/ROXICET) 5-325 MG tablet Take 1-2 tablets by mouth every 6 (six) hours as needed. (Patient not taking: Reported on 03/06/2019) 20 tablet 0    oxyCODONE-acetaminophen (PERCOCET/ROXICET) 5-325 MG tablet Take 1-2 tablets by mouth every 6 (six) hours as needed. (Patient not taking: Reported on 03/06/2019) 20 tablet 0    Prenatal-Fe Fum-Methf-FA w/o A (VITAFOL-NANO) 18-0.6-0.4 MG TABS Take 1 tablet by mouth daily. (Patient not taking: Reported on 03/06/2019) 30 tablet 12    PROAIR HFA 108 (90 Base) MCG/ACT inhaler INHALE 2 PUFFS INTO  THE LUNGS EVERY 6 HOURS AS NEEDED FOR WHEEZING OR SHORTNESS OF BREATH 8.5 g 5 More than a month   tinidazole (TINDAMAX) 500 MG tablet Take 4 tablets (2,000 mg total) by mouth daily after breakfast. 8 tablet 0     Review of Systems  Gastrointestinal:  Positive for abdominal pain (Cramping) and nausea. Negative for vomiting.  Genitourinary:  Positive for vaginal discharge (Unsure). Negative for difficulty urinating, dysuria and vaginal bleeding.   Physical Exam   Blood pressure (!) 158/105, pulse 82, temperature 98.4 F (36.9 C), temperature source Oral, resp. rate 20, height 5\' 5"  (1.651 m), weight 94.8 kg, last menstrual period 10/16/2022, SpO2 100 %, unknown if currently breastfeeding. Vitals:   12/27/22 1846 12/27/22 1905 12/27/22 1915 12/27/22 2011  BP: (!) 179/100 (!) 158/105 (!) 138/94 (!) 141/92  Pulse: 92 82 83 82  Resp:   17 18  Temp:      TempSrc:      SpO2:      Weight:      Height:        Physical Exam Vitals reviewed. Exam conducted with a chaperone present.  Constitutional:      Appearance: Normal appearance. She is well-developed.  HENT:     Head: Normocephalic and atraumatic.  Eyes:     Conjunctiva/sclera: Conjunctivae normal.  Cardiovascular:     Rate and Rhythm: Normal rate.  Pulmonary:     Effort: Pulmonary effort is normal. No respiratory distress.  Abdominal:     Palpations: Abdomen is soft.     Tenderness: There is no abdominal tenderness.  Musculoskeletal:        General: Normal range of motion.     Cervical back: Normal range of motion.  Skin:    General: Skin is warm and dry.  Neurological:     Mental Status: She is alert and oriented to person, place, and time.  Psychiatric:        Mood and Affect: Mood normal.        Behavior: Behavior normal.     MAU Course  Procedures Results for orders placed or performed during the hospital encounter of 12/27/22 (from the past 24 hour(s))  Pregnancy, urine POC     Status: Abnormal   Collection  Time: 12/27/22  5:52 PM  Result Value Ref Range   Preg Test, Ur POSITIVE (A) NEGATIVE  CBC     Status: Abnormal   Collection Time: 12/27/22  6:39 PM  Result Value Ref Range   WBC 10.5 4.0 -  10.5 K/uL   RBC 3.80 (L) 3.87 - 5.11 MIL/uL   Hemoglobin 10.9 (L) 12.0 - 15.0 g/dL   HCT 32.7 (L) 36.0 - 46.0 %   MCV 86.1 80.0 - 100.0 fL   MCH 28.7 26.0 - 34.0 pg   MCHC 33.3 30.0 - 36.0 g/dL   RDW 13.5 11.5 - 15.5 %   Platelets 341 150 - 400 K/uL   nRBC 0.0 0.0 - 0.2 %  hCG, quantitative, pregnancy     Status: Abnormal   Collection Time: 12/27/22  6:39 PM  Result Value Ref Range   hCG, Beta Chain, Quant, S 86,203 (H) <5 mIU/mL  Wet prep, genital     Status: Abnormal   Collection Time: 12/27/22  6:39 PM   Specimen: Vaginal  Result Value Ref Range   Yeast Wet Prep HPF POC NONE SEEN NONE SEEN   Trich, Wet Prep NONE SEEN NONE SEEN   Clue Cells Wet Prep HPF POC PRESENT (A) NONE SEEN   WBC, Wet Prep HPF POC <10 <10   Sperm NONE SEEN   Comprehensive metabolic panel     Status: Abnormal   Collection Time: 12/27/22  6:39 PM  Result Value Ref Range   Sodium 134 (L) 135 - 145 mmol/L   Potassium 3.6 3.5 - 5.1 mmol/L   Chloride 103 98 - 111 mmol/L   CO2 21 (L) 22 - 32 mmol/L   Glucose, Bld 80 70 - 99 mg/dL   BUN 9 6 - 20 mg/dL   Creatinine, Ser 0.84 0.44 - 1.00 mg/dL   Calcium 8.5 (L) 8.9 - 10.3 mg/dL   Total Protein 7.1 6.5 - 8.1 g/dL   Albumin 3.7 3.5 - 5.0 g/dL   AST 15 15 - 41 U/L   ALT 13 0 - 44 U/L   Alkaline Phosphatase 41 38 - 126 U/L   Total Bilirubin 1.1 0.3 - 1.2 mg/dL   GFR, Estimated >60 >60 mL/min   Anion gap 10 5 - 15  ABO/Rh     Status: None   Collection Time: 12/27/22  6:48 PM  Result Value Ref Range   ABO/RH(D) A NEG    Antibody Screen      NEG Performed at [facility], [address] [place], [place] [address]    [place] Comp Less 14 Wks  Result Date: 12/27/2022 CLINICAL DATA:  Abdominal pain in first trimester of pregnancy, LMP possibly 10/16/2022 EXAM:  OBSTETRIC <14 WK ULTRASOUND TECHNIQUE: Transabdominal ultrasound was performed for evaluation of the gestation as well as the maternal uterus and adnexal regions. COMPARISON:  None Available. FINDINGS: Intrauterine gestational sac: Present, single Yolk sac:  Present Embryo:  Present Cardiac Activity: Present Heart Rate: 169 bpm CRL:   46.0 mm   11 w 3 d                  [place] EDC: 07/15/2023 Subchorionic hemorrhage:  None visualized. Maternal uterus/adnexae: Remainder of maternal uterus unremarkable. LEFT ovary normal size and morphology 3.5 x 2.0 x 2.1 cm. RIGHT ovary normal size and morphology 3.2 x 1.6 x 1.8 cm. No free pelvic fluid or adnexal masses. IMPRESSION: Single live intrauterine gestation at 11 weeks 3 days EGA by crown-rump length. No acute abnormalities. Electronically Signed   By: Lavonia Dana M.D.   On: 12/27/2022 20:12    MDM Exam Cultures: Wet Prep and GC/CT Labs: UA, UPT, CBC, hCG, ABO, CMP, PCR Ultrasound Antihypertensive Muscle Relaxant Analgesic Antiemetic Assessment and Plan  37 year old [id number] at 10.2 weeks Abdominal Pain CHTN Nausea  -POC Reviewed. -Exam performed. -Cultures collected. -Labs Ordered -Patient offered and accepts pain and nausea medication. -Will give Flexeril, Tylenol, and Reglan.  -Informed that pain likely round ligament considering relieved with rest. -Will send for ultrasound to confirm IUP. -Give procardia 30mg  for blood pressures.  Informed she will be taking daily t/o pregnancy.   Maryann Conners 12/27/2022, 7:11 PM   Reassessment (8:54 PM)  -Patient reports improvement in pain.  -BP with improvement with procardia dosing. -Message sent to [facility] for BP check in 1-2 weeks for modification as appropriate.  -Rx for Reglan, Procardia, PNV, and Pepcid sent to pharmacy on file.  -Reviewed results.  -Patient without questions. -Encouraged to call primary office or return to MAU if symptoms worsen or with the onset of new  symptoms. -Discharged to home in stable condition.  Maryann Conners MSN, CNM Advanced Practice Provider, [facility]

## 2022-12-28 LAB — GC/CHLAMYDIA PROBE AMP (~~LOC~~) NOT AT ARMC
Chlamydia: NEGATIVE
Comment: NEGATIVE
Comment: NORMAL
Neisseria Gonorrhea: NEGATIVE

## 2022-12-29 ENCOUNTER — Ambulatory Visit (INDEPENDENT_AMBULATORY_CARE_PROVIDER_SITE_OTHER): Payer: Self-pay | Admitting: *Deleted

## 2022-12-29 DIAGNOSIS — Z1339 Encounter for screening examination for other mental health and behavioral disorders: Secondary | ICD-10-CM

## 2022-12-29 DIAGNOSIS — O0990 Supervision of high risk pregnancy, unspecified, unspecified trimester: Secondary | ICD-10-CM | POA: Insufficient documentation

## 2022-12-29 NOTE — Progress Notes (Addendum)
New OB Intake  I connected with [place]  on 12/29/22 at  8:15 AM EDT by In Person Visit and verified that I am speaking with the correct person using two identifiers. Nurse is located at [facility] and pt is located at [place].  I discussed the limitations, risks, security and privacy concerns of performing an evaluation and management service by telephone and the availability of in person appointments. I also discussed with the patient that there may be a patient responsible charge related to this service. The patient expressed understanding and agreed to proceed.  I explained I am completing New OB Intake today. We discussed EDD of 07/15/23 that is based on [place] at [redacted]w[redacted]d on 12/27/22 . Pt is G17/P4. I reviewed her allergies, medications, Medical/Surgical/OB history, and appropriate screenings. I informed her of [facility] services. [facility] information placed in AVS. Based on history, this is a high risk pregnancy.  Patient Active Problem List   Diagnosis Date Noted   GBS (group B Streptococcus carrier), +RV culture, currently pregnant 05/23/2018   Abnormal fetal ultrasound 04/01/2018   Rubella non-immune status, antepartum 01/11/2018   Vitamin D deficiency 01/11/2018   Anemia in pregnancy 01/11/2018   Grand multiparity 01/03/2018   History of asthma 01/03/2018    Concerns addressed today  Delivery Plans Plans to deliver at [facility]. Patient given information for [facility] Healthy Baby website for more information about Women's and [place]. Patient is not interested in water birth. Offered upcoming OB visit with CNM to discuss further.  MyChart/Babyscripts MyChart access verified. I explained pt will have some visits in office and some virtually. Babyscripts instructions given and order placed. Patient verifies receipt of registration text/e-mail. Account successfully created and app downloaded.  Blood Pressure Cuff/Weight Scale Pt has BP cuff at home. Explained after first prenatal  appt pt will check weekly and document in 61. Patient does not have weight scale; patient may purchase if they desire to track weight weekly in Babyscripts.  Anatomy US Explained first scheduled [place] will be around 19 weeks. Anatomy US scheduled for 19 weeks at [facility]. Pt notified to arrive at [facility].  Labs Discussed Johnsie Cancel genetic screening with patient. Would like both Panorama and Horizon drawn at new OB visit. Routine prenatal labs needed.  COVID Vaccine Patient has had COVID vaccine.  Social Determinants of Health Food Insecurity: Patient denies food insecurity. WIC Referral: Patient is interested in referral to [facility].  Transportation: Patient denies transportation needs. Childcare: Discussed no children allowed at ultrasound appointments. Offered childcare services; patient declines childcare services at this time.  Interested in [place]? If yes, send referral and doula dot phrase.   First visit review I reviewed new OB appt with patient. I explained they will have a provider visit that includes Pap, urine, labs, discussion. Explained pt will be seen by Rip Harbour at first visit; encounter routed to appropriate provider. Explained that patient will be seen by pregnancy navigator following visit with provider.   Penny Pia, RN 12/29/2022  8:55 AM

## 2023-01-14 ENCOUNTER — Encounter: Payer: Self-pay | Admitting: Obstetrics and Gynecology

## 2023-01-14 ENCOUNTER — Institutional Professional Consult (permissible substitution): Payer: Self-pay | Admitting: Licensed Clinical Social Worker

## 2023-01-19 ENCOUNTER — Other Ambulatory Visit (HOSPITAL_COMMUNITY)
Admission: RE | Admit: 2023-01-19 | Discharge: 2023-01-19 | Disposition: A | Discharge status: Home / Self Care | Payer: Commercial Managed Care - HMO | Source: Ambulatory Visit | Attending: Obstetrics and Gynecology | Admitting: Obstetrics and Gynecology

## 2023-01-19 ENCOUNTER — Ambulatory Visit (INDEPENDENT_AMBULATORY_CARE_PROVIDER_SITE_OTHER): Payer: Self-pay | Admitting: Obstetrics and Gynecology

## 2023-01-19 VITALS — BP 134/92 | HR 79 | Wt 209.0 lb

## 2023-01-19 DIAGNOSIS — Z98891 History of uterine scar from previous surgery: Secondary | ICD-10-CM

## 2023-01-19 DIAGNOSIS — O0990 Supervision of high risk pregnancy, unspecified, unspecified trimester: Secondary | ICD-10-CM

## 2023-01-19 DIAGNOSIS — O10919 Unspecified pre-existing hypertension complicating pregnancy, unspecified trimester: Secondary | ICD-10-CM

## 2023-01-19 DIAGNOSIS — Z3A14 14 weeks gestation of pregnancy: Secondary | ICD-10-CM

## 2023-01-19 DIAGNOSIS — O09292 Supervision of pregnancy with other poor reproductive or obstetric history, second trimester: Secondary | ICD-10-CM

## 2023-01-19 DIAGNOSIS — O219 Vomiting of pregnancy, unspecified: Secondary | ICD-10-CM

## 2023-01-19 DIAGNOSIS — Z641 Problems related to multiparity: Secondary | ICD-10-CM

## 2023-01-19 DIAGNOSIS — O0992 Supervision of high risk pregnancy, unspecified, second trimester: Secondary | ICD-10-CM

## 2023-01-19 DIAGNOSIS — O10912 Unspecified pre-existing hypertension complicating pregnancy, second trimester: Secondary | ICD-10-CM

## 2023-01-19 DIAGNOSIS — O09299 Supervision of pregnancy with other poor reproductive or obstetric history, unspecified trimester: Secondary | ICD-10-CM

## 2023-01-19 MED ORDER — NIFEDIPINE ER 60 MG PO TB24: 60.0000 mg | ORAL_TABLET | Freq: Every day | ORAL | 4 refills | Status: DC

## 2023-01-19 MED ORDER — DOXYLAMINE-PYRIDOXINE 10-10 MG PO TBEC: 2.0000 | DELAYED_RELEASE_TABLET | Freq: Every day | ORAL | 5 refills | Status: DC

## 2023-01-19 MED ORDER — ASPIRIN 81 MG PO CHEW: 81.0000 mg | CHEWABLE_TABLET | Freq: Every day | ORAL | 8 refills | Status: DC

## 2023-01-19 NOTE — Progress Notes (Signed)
Pt states she needs new Rx for nausea. Pt would also like PNV - not tolerating due to N&V.

## 2023-01-19 NOTE — Progress Notes (Addendum)
INITIAL PRENATAL VISIT NOTE  Subjective:  Emma Page is a 37 y.o. [id number] at [redacted]w[redacted]d by ultrasound being seen today for her initial prenatal visit. She has an obstetric history significant for CHTN, hx of preeclampsia, advanced maternal ageand previous c/s for twins. She has a medical history significant for hypertension.  Patient reports no complaints.  Contractions: Not present. Vag. Bleeding: None.   . Denies leaking of fluid.    Past Medical History:  Diagnosis Date   Asthma    Chlamydia    Gonorrhea    Hypertension    Infection    UTI   Pregnancy induced hypertension    Trichomonas infection    Vaginal Pap smear, abnormal    bx x3    Past Surgical History:  Procedure Laterality Date   abortion     CESAREAN SECTION MULTI-GESTATIONAL N/A 05/27/2018   Procedure: CESAREAN SECTION MULTI-GESTATIONAL;  Surgeon: Lesly Dukes, MD;  Location: [facility];  Service: Obstetrics;  Laterality: N/A;   DILATION AND CURETTAGE OF UTERUS     DILATION AND EVACUATION N/A 08/19/2017   Procedure: DILATATION AND EVACUATION;  Surgeon: Willodean Rosenthal, MD;  Location: [facility];  Service: Gynecology;  Laterality: N/A;   EYE SURGERY Right    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    OB History  Gravida Para Term Preterm AB Living  SAB IAB Ectopic Multiple Live Births  7 5 0 1 5    # Outcome Date GA Lbr Len/2nd Weight Sex Delivery Anes PTL Lv  17 Current           16A Preterm 05/27/18 [redacted]w[redacted]d  3 lb 14.8 oz (1.78 kg) F CS-LTranv Spinal  LIV  16B Preterm 05/27/18 [redacted]w[redacted]d  2 lb 4.3 oz (1.03 kg) F CS-LTranv Spinal  LIV  15 Term 07/04/05    F Vag-Spont   LIV  14 Term 11/09/02    M Vag-Spont   LIV  13 Term 12/24/00    F Vag-Spont   LIV  12 SAB           11 SAB           10 SAB           9 SAB           8 IAB           7 IAB           6 IAB           5 IAB           4 IAB           3 SAB           2 SAB           1 SAB             Social History    Socioeconomic History   Marital status: Single    Spouse name: Not on file   Number of children: Not on file   Years of education: Not on file   Highest education level: Not on file  Occupational History   Not on file  Tobacco Use   Smoking status: Never   Smokeless tobacco: Never  Vaping Use   Vaping Use: Never used  Substance and Sexual Activity   Alcohol use: No    Comment: occasional-socially, 3-4 week; not while pregnant   Drug use: No   Sexual activity:  Yes    Birth control/protection: None  Other Topics Concern   Not on file  Social History Narrative   Not on file   Social Determinants of Health   Financial Resource Strain: Low Risk  (05/27/2018)   Overall Financial Resource Strain (CARDIA)    Difficulty of Paying Living Expenses: Not hard at all  Food Insecurity: Not on file  Transportation Needs: Not on file  Physical Activity: Not on file  Stress: Not on file  Social Connections: Not on file    Family History  Problem Relation Age of Onset   Diabetes Paternal Grandfather    Cancer Paternal Grandmother    Cancer Maternal Grandmother    Heart disease Maternal Grandmother    Hypertension Father    Anesthesia problems Neg Hx      Current Outpatient Medications:    aspirin 81 MG chewable tablet, Chew 1 tablet (81 mg total) by mouth daily., Disp: 30 tablet, Rfl: 8   famotidine (PEPCID) 20 MG tablet, Take 1 tablet (20 mg total) by mouth daily. May increase to twice daily if needed., Disp: 60 tablet, Rfl: 2   metoCLOPramide (REGLAN) 10 MG tablet, Take 1 tablet (10 mg total) by mouth every 6 (six) hours as needed for nausea., Disp: 30 tablet, Rfl: 1   NIFEdipine (ADALAT CC) 60 MG 24 hr tablet, Take 1 tablet (60 mg total) by mouth daily., Disp: 30 tablet, Rfl: 4   Prenatal Vit-Fe Fumarate-FA (PREPLUS) 27-1 MG TABS, Take 1 tablet by mouth daily., Disp: 90 tablet, Rfl: 3   metroNIDAZOLE (METROGEL) 0.75 % vaginal gel, Place 1 Applicatorful vaginally at bedtime.  Apply one applicatorful to vagina at bedtime for 5 days (Patient not taking: Reported on 12/29/2022), Disp: 70 g, Rfl: 0   PROAIR HFA 108 (90 Base) MCG/ACT inhaler, INHALE 2 PUFFS INTO THE LUNGS EVERY 6 HOURS AS NEEDED FOR WHEEZING OR SHORTNESS OF BREATH, Disp: 8.5 g, Rfl: 5   Vitamin D, Ergocalciferol, (DRISDOL) 1.25 MG (50000 UNIT) CAPS capsule, Take 50,000 Units by mouth once a week., Disp: , Rfl:   Allergies  Allergen Reactions   Lisinopril Swelling    Review of Systems: Negative except for what is mentioned in HPI.  Objective:   Vitals:   01/19/23 1605 01/19/23 1632  BP: (!) 142/95 (!) 134/92  Pulse: 79   Weight: 209 lb (94.8 kg)     Fetal Status: Fetal Heart Rate (bpm): 155         Physical Exam: BP (!) 134/92   Pulse 79   Wt 209 lb (94.8 kg)   LMP 10/16/2022   BMI 34.78 kg/m  CONSTITUTIONAL: Well-developed, well-nourished female in no acute distress.  NEUROLOGIC: Alert and oriented to person, place, and time. Normal reflexes, muscle tone coordination. No cranial nerve deficit noted. PSYCHIATRIC: Normal mood and affect. Normal behavior. Normal judgment and thought content. SKIN: Skin is warm and dry. No rash noted. Not diaphoretic. No erythema. No pallor. HENT:  Normocephalic, atraumatic, External right and left ear normal. Oropharynx is clear and moist EYES: Conjunctivae and EOM are normal.  NECK: Normal range of motion, supple, no masses CARDIOVASCULAR: Normal heart rate noted, regular rhythm RESPIRATORY: Effort and breath sounds normal, no problems with respiration noted BREASTS: symmetric, non-tender, no masses palpable, chaperone present ABDOMEN: Soft, nontender, nondistended, gravid. C/s scar noted GU: normal appearing external female genitalia, multiparous, normal appearing cervix, scant white discharge in vagina, no lesions noted, pap and GC/C taken Bimanual: 15 weeks sized uterus, no adnexal tenderness  or palpable lesions noted MUSCULOSKELETAL: Normal range of  motion. EXT:  No edema and no tenderness. 2+ distal pulses.   Assessment and Plan:  Pregnancy: [id number] at [redacted]w[redacted]d by ultrasound  1. Supervision of high risk pregnancy, antepartum Continue routine prenatal care Rx sent for diclegis  - Cytology - PAP - CBC/D/Plt+RPR+Rh+ABO+RubIgG... - Culture, OB Urine - Cervicovaginal ancillary only( [facility]) - Hemoglobin A1c - Comprehensive metabolic panel - Panorama Prenatal Test Full Panel - [facility] Basic Panel - Protein / creatinine ratio, urine  2. Grand multiparity   3. Chronic hypertension during pregnancy, antepartum Increase procardia to 60 mg po daily due to continued elevated BP, BP check in 2 weeks Baby ASA now Baseline preeclampsia labs  - Comprehensive metabolic panel - Protein / creatinine ratio, urine - aspirin 81 MG chewable tablet; Chew 1 tablet (81 mg total) by mouth daily.  Dispense: 30 tablet; Refill: 8 - NIFEdipine (ADALAT CC) 60 MG 24 hr tablet; Take 1 tablet (60 mg total) by mouth daily.  Dispense: 30 tablet; Refill: 4  4. History of cesarean delivery Pt is leaning toward repeat c section with BTL.   VBAC handout given, she is a good candidate for VBAC due to 3 previous vaginal deliveries.  5. Hx of preeclampsia, prior pregnancy, currently pregnant Baby ASA, monitor BP closely   Preterm labor symptoms and general obstetric precautions including but not limited to vaginal bleeding, contractions, leaking of fluid and fetal movement were reviewed in detail with the patient.  Please refer to After Visit Summary for other counseling recommendations.   Return in about 2 weeks (around 02/02/2023) for [facility], in person.  Warden Fillers 01/19/2023 5:01 PM

## 2023-01-20 ENCOUNTER — Other Ambulatory Visit: Payer: Self-pay | Admitting: *Deleted

## 2023-01-20 DIAGNOSIS — O219 Vomiting of pregnancy, unspecified: Secondary | ICD-10-CM

## 2023-01-20 LAB — COMPREHENSIVE METABOLIC PANEL
ALT: 30 IU/L (ref 0–32)
AST: 23 IU/L (ref 0–40)
Albumin/Globulin Ratio: 1.5 (ref 1.2–2.2)
Albumin: 4.2 g/dL (ref 3.9–4.9)
Alkaline Phosphatase: 60 IU/L (ref 44–121)
BUN/Creatinine Ratio: 15 (ref 9–23)
BUN: 12 mg/dL (ref 6–20)
Bilirubin Total: 0.4 mg/dL (ref 0.0–1.2)
CO2: 18 mmol/L — ABNORMAL LOW (ref 20–29)
Calcium: 9.3 mg/dL (ref 8.7–10.2)
Chloride: 102 mmol/L (ref 96–106)
Creatinine, Ser: 0.81 mg/dL (ref 0.57–1.00)
Globulin, Total: 2.8 g/dL (ref 1.5–4.5)
Glucose: 77 mg/dL (ref 70–99)
Potassium: 3.9 mmol/L (ref 3.5–5.2)
Sodium: 135 mmol/L (ref 134–144)
Total Protein: 7 g/dL (ref 6.0–8.5)
eGFR: 96 mL/min/{1.73_m2} (ref >59)

## 2023-01-20 LAB — CBC/D/PLT+RPR+RH+ABO+RUBIGG...
Antibody Screen: NEGATIVE
Basophils Absolute: 0.1 10*3/uL (ref 0.0–0.2)
Basos: 1 %
EOS (ABSOLUTE): 0.5 10*3/uL — ABNORMAL HIGH (ref 0.0–0.4)
Eos: 4 %
HCV Ab: NONREACTIVE
HIV Screen 4th Generation wRfx: NONREACTIVE
Hematocrit: 34.5 % (ref 34.0–46.6)
Hemoglobin: 11.2 g/dL (ref 11.1–15.9)
Hepatitis B Surface Ag: NEGATIVE
Immature Grans (Abs): 0 10*3/uL (ref 0.0–0.1)
Immature Granulocytes: 0 %
Lymphocytes Absolute: 3.1 10*3/uL (ref 0.7–3.1)
Lymphs: 24 %
MCH: 28.4 pg (ref 26.6–33.0)
MCHC: 32.5 g/dL (ref 31.5–35.7)
MCV: 87 fL (ref 79–97)
Monocytes Absolute: 1 10*3/uL — ABNORMAL HIGH (ref 0.1–0.9)
Monocytes: 8 %
Neutrophils Absolute: 8.2 10*3/uL — ABNORMAL HIGH (ref 1.4–7.0)
Neutrophils: 63 %
Platelets: 360 10*3/uL (ref 150–450)
RBC: 3.95 x10E6/uL (ref 3.77–5.28)
RDW: 12.7 % (ref 11.7–15.4)
RPR Ser Ql: NONREACTIVE
Rh Factor: NEGATIVE
Rubella Antibodies, IGG: <0.9 index — ABNORMAL LOW (ref >0.99)
WBC: 12.9 10*3/uL — ABNORMAL HIGH (ref 3.4–10.8)

## 2023-01-20 LAB — HCV INTERPRETATION

## 2023-01-20 LAB — HEMOGLOBIN A1C
Est. average glucose Bld gHb Est-mCnc: 114 mg/dL
Hgb A1c MFr Bld: 5.6 % (ref 4.8–5.6)

## 2023-01-20 MED ORDER — PROMETHAZINE HCL 25 MG PO TABS: 25.0000 mg | ORAL_TABLET | Freq: Four times a day (QID) | ORAL | 1 refills | Status: DC | PRN

## 2023-01-20 NOTE — Progress Notes (Signed)
Pt reports cont'd N/V in pregnancy. Diclegis not covered by insurance. Phenergan RX per protocol.

## 2023-01-21 LAB — CERVICOVAGINAL ANCILLARY ONLY
Bacterial Vaginitis (gardnerella): NEGATIVE
Chlamydia: NEGATIVE
Comment: NEGATIVE
Comment: NEGATIVE
Comment: NEGATIVE
Comment: NORMAL
Neisseria Gonorrhea: NEGATIVE
Trichomonas: NEGATIVE

## 2023-01-21 LAB — CYTOLOGY - PAP
Comment: NEGATIVE
Diagnosis: NEGATIVE
High risk HPV: NEGATIVE

## 2023-01-21 LAB — PROTEIN / CREATININE RATIO, URINE
Creatinine, Urine: 265.9 mg/dL
Protein, Ur: 41.7 mg/dL
Protein/Creat Ratio: 157 mg/g creat (ref 0–200)

## 2023-01-21 LAB — CULTURE, OB URINE

## 2023-01-21 LAB — URINE CULTURE, OB REFLEX

## 2023-01-26 LAB — HORIZON CUSTOM: REPORT SUMMARY: NEGATIVE

## 2023-01-27 LAB — PANORAMA PRENATAL TEST FULL PANEL:PANORAMA TEST PLUS 5 ADDITIONAL MICRODELETIONS: FETAL FRACTION: 5.1

## 2023-02-03 ENCOUNTER — Telehealth: Payer: Self-pay

## 2023-02-03 ENCOUNTER — Encounter: Payer: Self-pay | Admitting: Obstetrics and Gynecology

## 2023-02-03 NOTE — Telephone Encounter (Signed)
Emma Page from [facility] with Care Manager in [place]. [name] states that they are not able to provide patient with services due to her private insurance. However, [name] expresses that there are concerns some concerns regarding patient history and current prenatal concerns. Patient is currently hypertensive and has asthma. Hx of preterm delivery and pre-E. Care management is offered through patients insurance.

## 2023-02-09 ENCOUNTER — Encounter: Payer: Self-pay | Admitting: Licensed Clinical Social Worker

## 2023-02-09 ENCOUNTER — Telehealth: Payer: Self-pay | Admitting: Licensed Clinical Social Worker

## 2023-02-09 NOTE — Telephone Encounter (Signed)
Called pt regarding mychart visit. Unable to message due to vm full

## 2023-02-16 ENCOUNTER — Encounter: Payer: Self-pay | Admitting: Obstetrics & Gynecology

## 2023-02-17 ENCOUNTER — Encounter: Payer: Self-pay | Admitting: *Deleted

## 2023-02-18 ENCOUNTER — Other Ambulatory Visit: Payer: Self-pay

## 2023-02-18 ENCOUNTER — Ambulatory Visit: Payer: Commercial Managed Care - HMO | Attending: Obstetrics and Gynecology

## 2023-02-18 ENCOUNTER — Encounter: Payer: Commercial Managed Care - HMO | Admitting: Obstetrics

## 2023-02-18 ENCOUNTER — Other Ambulatory Visit: Payer: Self-pay | Admitting: Obstetrics and Gynecology

## 2023-02-18 ENCOUNTER — Ambulatory Visit: Payer: Commercial Managed Care - HMO

## 2023-02-18 VITALS — BP 140/95 | HR 81

## 2023-02-18 DIAGNOSIS — O09522 Supervision of elderly multigravida, second trimester: Secondary | ICD-10-CM

## 2023-02-18 DIAGNOSIS — Z641 Problems related to multiparity: Secondary | ICD-10-CM

## 2023-02-18 DIAGNOSIS — J45909 Unspecified asthma, uncomplicated: Secondary | ICD-10-CM

## 2023-02-18 DIAGNOSIS — O0942 Supervision of pregnancy with grand multiparity, second trimester: Secondary | ICD-10-CM | POA: Diagnosis not present

## 2023-02-18 DIAGNOSIS — O0990 Supervision of high risk pregnancy, unspecified, unspecified trimester: Secondary | ICD-10-CM

## 2023-02-18 DIAGNOSIS — O09292 Supervision of pregnancy with other poor reproductive or obstetric history, second trimester: Secondary | ICD-10-CM

## 2023-02-18 DIAGNOSIS — Z3A19 19 weeks gestation of pregnancy: Secondary | ICD-10-CM | POA: Diagnosis not present

## 2023-02-18 DIAGNOSIS — O99212 Obesity complicating pregnancy, second trimester: Secondary | ICD-10-CM

## 2023-02-18 DIAGNOSIS — O99512 Diseases of the respiratory system complicating pregnancy, second trimester: Secondary | ICD-10-CM | POA: Diagnosis not present

## 2023-02-18 DIAGNOSIS — E669 Obesity, unspecified: Secondary | ICD-10-CM | POA: Diagnosis not present

## 2023-02-18 DIAGNOSIS — O365920 Maternal care for other known or suspected poor fetal growth, second trimester, not applicable or unspecified: Secondary | ICD-10-CM

## 2023-02-18 DIAGNOSIS — O10912 Unspecified pre-existing hypertension complicating pregnancy, second trimester: Secondary | ICD-10-CM

## 2023-02-18 DIAGNOSIS — O34219 Maternal care for unspecified type scar from previous cesarean delivery: Secondary | ICD-10-CM

## 2023-02-18 DIAGNOSIS — O10012 Pre-existing essential hypertension complicating pregnancy, second trimester: Secondary | ICD-10-CM | POA: Diagnosis not present

## 2023-02-22 ENCOUNTER — Encounter: Payer: Self-pay | Admitting: Obstetrics and Gynecology

## 2023-02-22 ENCOUNTER — Telehealth: Payer: Self-pay

## 2023-02-22 DIAGNOSIS — O365990 Maternal care for other known or suspected poor fetal growth, unspecified trimester, not applicable or unspecified: Secondary | ICD-10-CM | POA: Insufficient documentation

## 2023-02-22 MED ORDER — LABETALOL HCL 200 MG PO TABS: 200.0000 mg | ORAL_TABLET | Freq: Three times a day (TID) | ORAL | 11 refills | Status: DC

## 2023-02-22 NOTE — Telephone Encounter (Signed)
Patient called requesting to change her BP medication. She states that she gets a headache every time she takes her Procardia. Patient states that she stopped taking the procardia for a couple of days to see if her sx would resolve. States that headache went away until she started back on medication. She has a BP cuff at home, but has not been monitoring BPs. Next appt is 5/22 with Dr. Debroah Loop

## 2023-02-24 ENCOUNTER — Ambulatory Visit (INDEPENDENT_AMBULATORY_CARE_PROVIDER_SITE_OTHER): Payer: Commercial Managed Care - HMO | Admitting: Obstetrics & Gynecology

## 2023-02-24 VITALS — BP 122/82 | HR 89 | Wt 217.0 lb

## 2023-02-24 DIAGNOSIS — O0990 Supervision of high risk pregnancy, unspecified, unspecified trimester: Secondary | ICD-10-CM | POA: Diagnosis not present

## 2023-02-24 DIAGNOSIS — O09299 Supervision of pregnancy with other poor reproductive or obstetric history, unspecified trimester: Secondary | ICD-10-CM

## 2023-02-24 DIAGNOSIS — Z98891 History of uterine scar from previous surgery: Secondary | ICD-10-CM

## 2023-02-24 DIAGNOSIS — Z641 Problems related to multiparity: Secondary | ICD-10-CM

## 2023-02-24 DIAGNOSIS — O10919 Unspecified pre-existing hypertension complicating pregnancy, unspecified trimester: Secondary | ICD-10-CM

## 2023-02-24 NOTE — Progress Notes (Signed)
   PRENATAL VISIT NOTE  Subjective:  Emma Page is a 37 y.o. [id number] at [redacted]w[redacted]d being seen today for ongoing prenatal care.  She is currently monitored for the following issues for this high-risk pregnancy and has Chronic hypertension during pregnancy, antepartum; Grand multiparity; Rubella non-immune status, antepartum; Vitamin D deficiency; GBS (group B Streptococcus carrier), +RV culture, currently pregnant; Supervision of high risk pregnancy, antepartum; History of cesarean delivery; Hx of preeclampsia, prior pregnancy, currently pregnant; and Suspected early onset fetal growth restriction on their problem list.  Patient reports no complaints.  Contractions: Not present. Vag. Bleeding: None.  Movement: Present. Denies leaking of fluid.   The following portions of the patient's history were reviewed and updated as appropriate: allergies, current medications, past family history, past medical history, past social history, past surgical history and problem list.   Objective:   Vitals:   02/24/23 1323  BP: 122/82  Pulse: 89  Weight: 217 lb (98.4 kg)    Fetal Status: Fetal Heart Rate (bpm): 153   Movement: Present     General:  Alert, oriented and cooperative. Patient is in no acute distress.  Skin: Skin is warm and dry. No rash noted.   Cardiovascular: Normal heart rate noted  Respiratory: Normal respiratory effort, no problems with respiration noted  Abdomen: Soft, gravid, appropriate for gestational age.  Pain/Pressure: Absent     Pelvic: Cervical exam deferred        Extremities: Normal range of motion.  Edema: None  Mental Status: Normal mood and affect. Normal behavior. Normal judgment and thought content.   Assessment and Plan:  Pregnancy: [id number] at [redacted]w[redacted]d 1. Supervision of high risk pregnancy, antepartum ONTD screen - AFP, Serum, Open Spina Bifida Supervision of high risk pregnancy, antepartum - Plan: AFP, Serum, Open Spina Bifida  Chronic hypertension during  pregnancy, antepartum  Grand multiparity  History of cesarean delivery  Hx of preeclampsia, prior pregnancy, currently pregnant  Preterm labor symptoms and general obstetric precautions including but not limited to vaginal bleeding, contractions, leaking of fluid and fetal movement were reviewed in detail with the patient. Please refer to After Visit Summary for other counseling recommendations.   Return in about 4 weeks (around 03/24/2023).  Future Appointments  Date Time Provider Department Center  02/24/2023  1:50 PM Adam Phenix, MD [facility] None  03/15/2023  1:30 PM [facility] NURSE [facility]  03/15/2023  1:45 PM [facility] US4 [facility]    Scheryl Darter, MD

## 2023-02-26 LAB — AFP, SERUM, OPEN SPINA BIFIDA
AFP MoM: 2.43
AFP Value: 117.1 ng/mL
Gest. Age on Collection Date: 19.6 weeks
Maternal Age At EDD: 38.2 yr
OSBR Risk 1 IN: 658
Test Results:: NEGATIVE
Weight: 217 [lb_av]

## 2023-03-10 ENCOUNTER — Encounter: Payer: Self-pay | Admitting: *Deleted

## 2023-03-15 ENCOUNTER — Other Ambulatory Visit: Payer: Self-pay | Admitting: Obstetrics and Gynecology

## 2023-03-15 ENCOUNTER — Encounter: Payer: Self-pay | Admitting: *Deleted

## 2023-03-15 ENCOUNTER — Other Ambulatory Visit: Payer: Self-pay | Admitting: *Deleted

## 2023-03-15 ENCOUNTER — Ambulatory Visit: Payer: Commercial Managed Care - HMO | Attending: Obstetrics and Gynecology

## 2023-03-15 ENCOUNTER — Ambulatory Visit: Payer: Commercial Managed Care - HMO | Admitting: *Deleted

## 2023-03-15 VITALS — BP 121/81 | HR 102

## 2023-03-15 DIAGNOSIS — O10912 Unspecified pre-existing hypertension complicating pregnancy, second trimester: Secondary | ICD-10-CM | POA: Insufficient documentation

## 2023-03-15 DIAGNOSIS — O09522 Supervision of elderly multigravida, second trimester: Secondary | ICD-10-CM | POA: Insufficient documentation

## 2023-03-15 DIAGNOSIS — O99212 Obesity complicating pregnancy, second trimester: Secondary | ICD-10-CM | POA: Insufficient documentation

## 2023-03-15 DIAGNOSIS — O10012 Pre-existing essential hypertension complicating pregnancy, second trimester: Secondary | ICD-10-CM | POA: Diagnosis not present

## 2023-03-15 DIAGNOSIS — O0942 Supervision of pregnancy with grand multiparity, second trimester: Secondary | ICD-10-CM

## 2023-03-15 DIAGNOSIS — Z641 Problems related to multiparity: Secondary | ICD-10-CM

## 2023-03-15 DIAGNOSIS — J45909 Unspecified asthma, uncomplicated: Secondary | ICD-10-CM | POA: Diagnosis not present

## 2023-03-15 DIAGNOSIS — O10919 Unspecified pre-existing hypertension complicating pregnancy, unspecified trimester: Secondary | ICD-10-CM

## 2023-03-15 DIAGNOSIS — O99512 Diseases of the respiratory system complicating pregnancy, second trimester: Secondary | ICD-10-CM | POA: Insufficient documentation

## 2023-03-15 DIAGNOSIS — Z3A22 22 weeks gestation of pregnancy: Secondary | ICD-10-CM | POA: Diagnosis not present

## 2023-03-15 DIAGNOSIS — E669 Obesity, unspecified: Secondary | ICD-10-CM | POA: Diagnosis not present

## 2023-03-15 DIAGNOSIS — O365920 Maternal care for other known or suspected poor fetal growth, second trimester, not applicable or unspecified: Secondary | ICD-10-CM | POA: Diagnosis not present

## 2023-03-15 DIAGNOSIS — O0990 Supervision of high risk pregnancy, unspecified, unspecified trimester: Secondary | ICD-10-CM | POA: Diagnosis present

## 2023-03-15 DIAGNOSIS — O34219 Maternal care for unspecified type scar from previous cesarean delivery: Secondary | ICD-10-CM

## 2023-03-15 DIAGNOSIS — Z3689 Encounter for other specified antenatal screening: Secondary | ICD-10-CM

## 2023-03-23 ENCOUNTER — Ambulatory Visit (INDEPENDENT_AMBULATORY_CARE_PROVIDER_SITE_OTHER): Payer: Commercial Managed Care - HMO | Admitting: Obstetrics and Gynecology

## 2023-03-23 ENCOUNTER — Encounter: Payer: Self-pay | Admitting: Obstetrics and Gynecology

## 2023-03-23 ENCOUNTER — Other Ambulatory Visit: Payer: Self-pay | Admitting: Obstetrics

## 2023-03-23 VITALS — BP 134/88 | HR 108 | Wt 221.0 lb

## 2023-03-23 DIAGNOSIS — Z98891 History of uterine scar from previous surgery: Secondary | ICD-10-CM

## 2023-03-23 DIAGNOSIS — O09299 Supervision of pregnancy with other poor reproductive or obstetric history, unspecified trimester: Secondary | ICD-10-CM

## 2023-03-23 DIAGNOSIS — J301 Allergic rhinitis due to pollen: Secondary | ICD-10-CM

## 2023-03-23 DIAGNOSIS — O0990 Supervision of high risk pregnancy, unspecified, unspecified trimester: Secondary | ICD-10-CM

## 2023-03-23 DIAGNOSIS — Z641 Problems related to multiparity: Secondary | ICD-10-CM

## 2023-03-23 DIAGNOSIS — O10919 Unspecified pre-existing hypertension complicating pregnancy, unspecified trimester: Secondary | ICD-10-CM

## 2023-03-23 DIAGNOSIS — O365990 Maternal care for other known or suspected poor fetal growth, unspecified trimester, not applicable or unspecified: Secondary | ICD-10-CM

## 2023-03-23 MED ORDER — CETIRIZINE HCL 10 MG PO TABS
10.0000 mg | ORAL_TABLET | Freq: Every day | ORAL | 11 refills | Status: DC
Start: 2023-03-23 — End: 2023-05-01

## 2023-03-23 NOTE — Progress Notes (Signed)
ROB,  c/o elevated BP at home 200/134, numbness in right arm, HA, floaters,  SOB, swollen in hands and feet. Pt is taking 60 mg Nifedipine BID.

## 2023-03-23 NOTE — Progress Notes (Signed)
Subjective:  Emma Page is a 37 y.o. [id number] at [redacted]w[redacted]d being seen today for ongoing prenatal care.  She is currently monitored for the following issues for this high-risk pregnancy and has Chronic hypertension during pregnancy, antepartum; Grand multiparity; Supervision of high risk pregnancy, antepartum; History of cesarean delivery; Hx of preeclampsia, prior pregnancy, currently pregnant; and Suspected early onset fetal growth restriction on their problem list.  Patient reports  general discomforts of pregnancy and carpal tunnel Sx .  Contractions: Not present. Vag. Bleeding: None.  Movement: Present. Denies leaking of fluid.   The following portions of the patient's history were reviewed and updated as appropriate: allergies, current medications, past family history, past medical history, past social history, past surgical history and problem list. Problem list updated.  Objective:   Vitals:   03/23/23 1048  BP: 134/88  Pulse: (!) 108  Weight: 221 lb (100.2 kg)    Fetal Status: Fetal Heart Rate (bpm): 154   Movement: Present     General:  Alert, oriented and cooperative. Patient is in no acute distress.  Skin: Skin is warm and dry. No rash noted.   Cardiovascular: Normal heart rate noted  Respiratory: Normal respiratory effort, no problems with respiration noted  Abdomen: Soft, gravid, appropriate for gestational age. Pain/Pressure: Absent     Pelvic:  Cervical exam deferred        Extremities: Normal range of motion.  Edema: Moderate pitting, indentation subsides rapidly  Mental Status: Normal mood and affect. Normal behavior. Normal judgment and thought content.   Urinalysis:      Assessment and Plan:  Pregnancy: [id number] at [redacted]w[redacted]d  1. Supervision of high risk pregnancy, antepartum Stable  2. Chronic hypertension during pregnancy, antepartum Will stop Labetalol and start Procardia 60 XL every day  3. History of cesarean delivery Stable  4. Hx of preeclampsia,  prior pregnancy, currently pregnant No S/Sx at present  5. Suspected early onset fetal growth restriction Growth 2.4 % with abnormal dopplers studies on 03/15/23 Has f/u growth and doppler studies on 03/30/23, in house management vs out pt management to be determine per U/S results  6. Grand multiparity Stable  Preterm labor symptoms and general obstetric precautions including but not limited to vaginal bleeding, contractions, leaking of fluid and fetal movement were reviewed in detail with the patient. Please refer to After Visit Summary for other counseling recommendations.  Return in about 4 weeks (around 04/20/2023) for OB visit, face to face, MD only.   Hermina Staggers, MD

## 2023-03-24 ENCOUNTER — Encounter: Payer: Commercial Managed Care - HMO | Admitting: Student

## 2023-03-30 ENCOUNTER — Ambulatory Visit: Payer: Commercial Managed Care - HMO

## 2023-03-31 ENCOUNTER — Other Ambulatory Visit: Payer: Self-pay

## 2023-04-06 ENCOUNTER — Ambulatory Visit (INDEPENDENT_AMBULATORY_CARE_PROVIDER_SITE_OTHER): Payer: Commercial Managed Care - HMO | Admitting: Obstetrics and Gynecology

## 2023-04-06 ENCOUNTER — Encounter: Payer: Self-pay | Admitting: Obstetrics and Gynecology

## 2023-04-06 VITALS — BP 152/103 | HR 92 | Wt 222.0 lb

## 2023-04-06 DIAGNOSIS — O09299 Supervision of pregnancy with other poor reproductive or obstetric history, unspecified trimester: Secondary | ICD-10-CM

## 2023-04-06 DIAGNOSIS — O10919 Unspecified pre-existing hypertension complicating pregnancy, unspecified trimester: Secondary | ICD-10-CM | POA: Diagnosis not present

## 2023-04-06 DIAGNOSIS — O365990 Maternal care for other known or suspected poor fetal growth, unspecified trimester, not applicable or unspecified: Secondary | ICD-10-CM

## 2023-04-06 DIAGNOSIS — Z3A25 25 weeks gestation of pregnancy: Secondary | ICD-10-CM

## 2023-04-06 DIAGNOSIS — O0990 Supervision of high risk pregnancy, unspecified, unspecified trimester: Secondary | ICD-10-CM

## 2023-04-06 DIAGNOSIS — Z98891 History of uterine scar from previous surgery: Secondary | ICD-10-CM

## 2023-04-06 NOTE — Progress Notes (Signed)
Pt states she has been taking weekly Vit D and needs new Rx. Elevated BP today.

## 2023-04-06 NOTE — Progress Notes (Addendum)
   PRENATAL VISIT NOTE  Subjective:  Emma Page is a 37 y.o. [id number] at [redacted]w[redacted]d being seen today for ongoing prenatal care.  She is currently monitored for the following issues for this high-risk pregnancy and has Chronic hypertension during pregnancy, antepartum; Grand multiparity; Supervision of high risk pregnancy, antepartum; History of cesarean delivery; Hx of preeclampsia, prior pregnancy, currently pregnant; and Suspected early onset fetal growth restriction on their problem list.  Patient reports no complaints.  Contractions: Not present. Vag. Bleeding: None.  Movement: Present. Denies leaking of fluid.   The following portions of the patient's history were reviewed and updated as appropriate: allergies, current medications, past family history, past medical history, past social history, past surgical history and problem list.   Objective:   Vitals:   04/06/23 1636 04/06/23 1704  BP: (!) 142/94 (!) 152/103  Pulse: 92   Weight: 222 lb (100.7 kg)     Fetal Status: Fetal Heart Rate (bpm): 150 Fundal Height: 23 cm Movement: Present     General:  Alert, oriented and cooperative. Patient is in no acute distress.  Skin: Skin is warm and dry. No rash noted.   Cardiovascular: Normal heart rate noted  Respiratory: Normal respiratory effort, no problems with respiration noted  Abdomen: Soft, gravid, appropriate for gestational age.  Pain/Pressure: Absent     Pelvic: Cervical exam deferred        Extremities: Normal range of motion.     Mental Status: Normal mood and affect. Normal behavior. Normal judgment and thought content.   Assessment and Plan:  Pregnancy: [id number] at [redacted]w[redacted]d 1. Supervision of high risk pregnancy, antepartum Patient is doing well without complaints Plan for third trimester labs and glucola next visit  2. Chronic hypertension during pregnancy, antepartum Patient with elevated BP at home with diastolic as high as 110. Patient reports headache Patient  sent to MAU for evaluation   3. Suspected early onset fetal growth restriction Patient missed follow up ultrasound on 6/25 Appointment rescheduled Discussed possible inpatient monitoring following ultrasound and patient indicated that this will be an issue as she is the sole provider for her family. Patient is very emotional during this visit  4. Hx of preeclampsia, prior pregnancy, currently pregnant Continue ASA  5. History of cesarean delivery   Preterm labor symptoms and general obstetric precautions including but not limited to vaginal bleeding, contractions, leaking of fluid and fetal movement were reviewed in detail with the patient. Please refer to After Visit Summary for other counseling recommendations.   Return in about 3 weeks (around 04/27/2023) for in person, ROB, 2 hr glucola next visit, High risk.  Future Appointments  Date Time Provider Department Center  04/20/2023  8:30 AM [facility] None  04/20/2023  9:55 AM Left[facility]-Kirby, Emma Page, CNM [facility] None  05/04/2023 10:35 AM Emma Pall, MD [facility] None  05/19/2023 10:55 AM Emma Fillers, MD [facility] None  06/02/2023 10:55 AM Emma Staggers, MD [facility] None    Emma Antigua, MD

## 2023-04-20 ENCOUNTER — Encounter: Payer: Commercial Managed Care - HMO | Admitting: Obstetrics and Gynecology

## 2023-04-20 ENCOUNTER — Other Ambulatory Visit: Payer: Commercial Managed Care - HMO

## 2023-04-28 ENCOUNTER — Inpatient Hospital Stay (HOSPITAL_COMMUNITY)
Admission: AD | Admit: 2023-04-28 | Discharge: 2023-05-01 | DRG: 806 | Disposition: A | Discharge status: Home / Self Care | Payer: Commercial Managed Care - HMO | Attending: Obstetrics and Gynecology | Admitting: Obstetrics and Gynecology

## 2023-04-28 ENCOUNTER — Ambulatory Visit: Payer: Commercial Managed Care - HMO | Admitting: *Deleted

## 2023-04-28 ENCOUNTER — Ambulatory Visit (HOSPITAL_BASED_OUTPATIENT_CLINIC_OR_DEPARTMENT_OTHER): Payer: Commercial Managed Care - HMO

## 2023-04-28 ENCOUNTER — Encounter (HOSPITAL_COMMUNITY): Payer: Self-pay | Admitting: Obstetrics and Gynecology

## 2023-04-28 ENCOUNTER — Ambulatory Visit (HOSPITAL_BASED_OUTPATIENT_CLINIC_OR_DEPARTMENT_OTHER): Payer: Commercial Managed Care - HMO | Admitting: Maternal & Fetal Medicine

## 2023-04-28 VITALS — BP 143/90 | HR 80

## 2023-04-28 DIAGNOSIS — O365990 Maternal care for other known or suspected poor fetal growth, unspecified trimester, not applicable or unspecified: Secondary | ICD-10-CM

## 2023-04-28 DIAGNOSIS — E669 Obesity, unspecified: Secondary | ICD-10-CM

## 2023-04-28 DIAGNOSIS — O365920 Maternal care for other known or suspected poor fetal growth, second trimester, not applicable or unspecified: Secondary | ICD-10-CM

## 2023-04-28 DIAGNOSIS — Z641 Problems related to multiparity: Secondary | ICD-10-CM

## 2023-04-28 DIAGNOSIS — O321XX0 Maternal care for breech presentation, not applicable or unspecified: Secondary | ICD-10-CM | POA: Diagnosis present

## 2023-04-28 DIAGNOSIS — O10919 Unspecified pre-existing hypertension complicating pregnancy, unspecified trimester: Secondary | ICD-10-CM

## 2023-04-28 DIAGNOSIS — O0990 Supervision of high risk pregnancy, unspecified, unspecified trimester: Secondary | ICD-10-CM | POA: Insufficient documentation

## 2023-04-28 DIAGNOSIS — O10013 Pre-existing essential hypertension complicating pregnancy, third trimester: Secondary | ICD-10-CM

## 2023-04-28 DIAGNOSIS — O1092 Unspecified pre-existing hypertension complicating childbirth: Secondary | ICD-10-CM | POA: Diagnosis not present

## 2023-04-28 DIAGNOSIS — Z3689 Encounter for other specified antenatal screening: Secondary | ICD-10-CM | POA: Insufficient documentation

## 2023-04-28 DIAGNOSIS — Z98891 History of uterine scar from previous surgery: Secondary | ICD-10-CM

## 2023-04-28 DIAGNOSIS — O283 Abnormal ultrasonic finding on antenatal screening of mother: Secondary | ICD-10-CM | POA: Insufficient documentation

## 2023-04-28 DIAGNOSIS — O114 Pre-existing hypertension with pre-eclampsia, complicating childbirth: Secondary | ICD-10-CM | POA: Diagnosis not present

## 2023-04-28 DIAGNOSIS — Z3A28 28 weeks gestation of pregnancy: Secondary | ICD-10-CM

## 2023-04-28 DIAGNOSIS — Z23 Encounter for immunization: Secondary | ICD-10-CM

## 2023-04-28 DIAGNOSIS — O4103X0 Oligohydramnios, third trimester, not applicable or unspecified: Secondary | ICD-10-CM | POA: Diagnosis present

## 2023-04-28 DIAGNOSIS — O26893 Other specified pregnancy related conditions, third trimester: Secondary | ICD-10-CM | POA: Diagnosis not present

## 2023-04-28 DIAGNOSIS — Z6791 Unspecified blood type, Rh negative: Secondary | ICD-10-CM

## 2023-04-28 DIAGNOSIS — O34211 Maternal care for low transverse scar from previous cesarean delivery: Secondary | ICD-10-CM | POA: Diagnosis not present

## 2023-04-28 DIAGNOSIS — O0943 Supervision of pregnancy with grand multiparity, third trimester: Secondary | ICD-10-CM | POA: Diagnosis not present

## 2023-04-28 DIAGNOSIS — O119 Pre-existing hypertension with pre-eclampsia, unspecified trimester: Secondary | ICD-10-CM | POA: Diagnosis present

## 2023-04-28 DIAGNOSIS — O365930 Maternal care for other known or suspected poor fetal growth, third trimester, not applicable or unspecified: Secondary | ICD-10-CM | POA: Diagnosis not present

## 2023-04-28 DIAGNOSIS — O364XX0 Maternal care for intrauterine death, not applicable or unspecified: Secondary | ICD-10-CM

## 2023-04-28 DIAGNOSIS — O1414 Severe pre-eclampsia complicating childbirth: Secondary | ICD-10-CM | POA: Diagnosis not present

## 2023-04-28 DIAGNOSIS — O34219 Maternal care for unspecified type scar from previous cesarean delivery: Secondary | ICD-10-CM

## 2023-04-28 DIAGNOSIS — O09522 Supervision of elderly multigravida, second trimester: Secondary | ICD-10-CM | POA: Insufficient documentation

## 2023-04-28 DIAGNOSIS — O99213 Obesity complicating pregnancy, third trimester: Secondary | ICD-10-CM

## 2023-04-28 DIAGNOSIS — Z7982 Long term (current) use of aspirin: Secondary | ICD-10-CM | POA: Diagnosis not present

## 2023-04-28 DIAGNOSIS — O09523 Supervision of elderly multigravida, third trimester: Secondary | ICD-10-CM | POA: Diagnosis not present

## 2023-04-28 LAB — TYPE AND SCREEN
ABO/RH(D): A NEG
Antibody Screen: POSITIVE
Donor AG Type: NEGATIVE
Donor AG Type: NEGATIVE
Unit division: 0
Unit division: 0

## 2023-04-28 LAB — COMPREHENSIVE METABOLIC PANEL
ALT: 22 U/L (ref 0–44)
AST: 22 U/L (ref 15–41)
Albumin: 2.8 g/dL — ABNORMAL LOW (ref 3.5–5.0)
Alkaline Phosphatase: 91 U/L (ref 38–126)
Anion gap: 8 (ref 5–15)
BUN: 9 mg/dL (ref 6–20)
CO2: 21 mmol/L — ABNORMAL LOW (ref 22–32)
Calcium: 8.6 mg/dL — ABNORMAL LOW (ref 8.9–10.3)
Chloride: 106 mmol/L (ref 98–111)
Creatinine, Ser: 0.99 mg/dL (ref 0.44–1.00)
GFR, Estimated: >60 mL/min (ref >60)
Glucose, Bld: 118 mg/dL — ABNORMAL HIGH (ref 70–99)
Potassium: 3.6 mmol/L (ref 3.5–5.1)
Sodium: 135 mmol/L (ref 135–145)
Total Bilirubin: 0.3 mg/dL (ref 0.3–1.2)
Total Protein: 6.6 g/dL (ref 6.5–8.1)

## 2023-04-28 LAB — RH IG WORKUP (INCLUDES ABO/RH): Gestational Age(Wks): 28.6

## 2023-04-28 LAB — BPAM RBC
Blood Product Expiration Date: 202408202359
Blood Product Expiration Date: 202408202359
Unit Type and Rh: 600
Unit Type and Rh: 600

## 2023-04-28 LAB — DIC (DISSEMINATED INTRAVASCULAR COAGULATION)PANEL
D-Dimer, Quant: 0.93 ug/mL-FEU — ABNORMAL HIGH (ref 0.00–0.50)
Fibrinogen: 519 mg/dL — ABNORMAL HIGH (ref 210–475)
INR: 0.9 (ref 0.8–1.2)
Platelets: 372 10*3/uL (ref 150–400)
Prothrombin Time: 12.5 seconds (ref 11.4–15.2)
Smear Review: NONE SEEN
aPTT: 27 seconds (ref 24–36)

## 2023-04-28 LAB — CBC
HCT: 31.4 % — ABNORMAL LOW (ref 36.0–46.0)
Hemoglobin: 10.7 g/dL — ABNORMAL LOW (ref 12.0–15.0)
MCH: 30.2 pg (ref 26.0–34.0)
MCHC: 34.1 g/dL (ref 30.0–36.0)
MCV: 88.7 fL (ref 80.0–100.0)
Platelets: 373 10*3/uL (ref 150–400)
RBC: 3.54 MIL/uL — ABNORMAL LOW (ref 3.87–5.11)
RDW: 13.9 % (ref 11.5–15.5)
WBC: 11.4 10*3/uL — ABNORMAL HIGH (ref 4.0–10.5)
nRBC: 0 % (ref 0.0–0.2)

## 2023-04-28 LAB — PROTEIN / CREATININE RATIO, URINE
Creatinine, Urine: 201 mg/dL
Protein Creatinine Ratio: 0.37 mg/mg{Cre} — ABNORMAL HIGH (ref 0.00–0.15)
Total Protein, Urine: 75 mg/dL

## 2023-04-28 MED ORDER — LABETALOL HCL 5 MG/ML IV SOLN
40.0000 mg | INTRAVENOUS | Status: DC | PRN
  Administered 2023-04-28: 40 mg via INTRAVENOUS
  Filled 2023-04-28: qty 8

## 2023-04-28 MED ORDER — LABETALOL HCL 200 MG PO TABS
200.0000 mg | ORAL_TABLET | Freq: Three times a day (TID) | ORAL | Status: DC
  Administered 2023-04-28: 200 mg via ORAL
  Filled 2023-04-28: qty 1

## 2023-04-28 MED ORDER — ACETAMINOPHEN 325 MG PO TABS
650.0000 mg | ORAL_TABLET | ORAL | Status: DC | PRN
  Administered 2023-04-28 – 2023-04-29 (×2): 650 mg via ORAL
  Filled 2023-04-28 (×2): qty 2

## 2023-04-28 MED ORDER — LACTATED RINGERS IV SOLN: INTRAVENOUS | Status: DC

## 2023-04-28 MED ORDER — LABETALOL HCL 5 MG/ML IV SOLN
20.0000 mg | INTRAVENOUS | Status: DC | PRN
  Administered 2023-04-28 (×2): 20 mg via INTRAVENOUS
  Filled 2023-04-28 (×2): qty 4

## 2023-04-28 MED ORDER — LIDOCAINE HCL (PF) 1 % IJ SOLN: 30.0000 mL | INTRAMUSCULAR | Status: DC | PRN

## 2023-04-28 MED ORDER — LACTATED RINGERS IV SOLN: 500.0000 mL | INTRAVENOUS | Status: DC | PRN

## 2023-04-28 MED ORDER — OXYTOCIN-SODIUM CHLORIDE 30-0.9 UT/500ML-% IV SOLN
1.0000 m[IU]/min | INTRAVENOUS | Status: DC
  Administered 2023-04-28: 4 m[IU]/min via INTRAVENOUS

## 2023-04-28 MED ORDER — HYDROXYZINE HCL 50 MG PO TABS
50.0000 mg | ORAL_TABLET | Freq: Three times a day (TID) | ORAL | Status: DC | PRN
  Administered 2023-04-28 – 2023-04-30 (×6): 50 mg via ORAL
  Filled 2023-04-28 (×6): qty 1

## 2023-04-28 MED ORDER — LABETALOL HCL 200 MG PO TABS
200.0000 mg | ORAL_TABLET | Freq: Once | ORAL | Status: AC
  Administered 2023-04-29: 200 mg via ORAL
  Filled 2023-04-28: qty 1

## 2023-04-28 MED ORDER — LABETALOL HCL 5 MG/ML IV SOLN
80.0000 mg | INTRAVENOUS | Status: DC | PRN
  Administered 2023-04-28: 80 mg via INTRAVENOUS
  Filled 2023-04-28: qty 16

## 2023-04-28 MED ORDER — LABETALOL HCL 200 MG PO TABS: 300.0000 mg | ORAL_TABLET | Freq: Three times a day (TID) | ORAL | Status: DC

## 2023-04-28 MED ORDER — RHO D IMMUNE GLOBULIN 1500 UNIT/2ML IJ SOSY
300.0000 ug | PREFILLED_SYRINGE | Freq: Once | INTRAMUSCULAR | Status: DC
  Filled 2023-04-28: qty 2

## 2023-04-28 MED ORDER — HYDRALAZINE HCL 20 MG/ML IJ SOLN
10.0000 mg | INTRAMUSCULAR | Status: DC | PRN
  Filled 2023-04-28 (×2): qty 1

## 2023-04-28 MED ORDER — FENTANYL CITRATE (PF) 100 MCG/2ML IJ SOLN
100.0000 ug | INTRAMUSCULAR | Status: DC | PRN
  Administered 2023-04-28 – 2023-04-30 (×9): 100 ug via INTRAVENOUS
  Filled 2023-04-28 (×9): qty 2

## 2023-04-28 MED ORDER — ONDANSETRON HCL 4 MG/2ML IJ SOLN: 4.0000 mg | Freq: Four times a day (QID) | INTRAMUSCULAR | Status: DC | PRN

## 2023-04-28 MED ORDER — OXYTOCIN BOLUS FROM INFUSION
333.0000 mL | Freq: Once | INTRAVENOUS | Status: AC
  Administered 2023-04-30: 333 mL via INTRAVENOUS

## 2023-04-28 MED ORDER — RHO D IMMUNE GLOBULIN 1500 UNIT/2ML IJ SOSY
300.0000 ug | PREFILLED_SYRINGE | Freq: Once | INTRAMUSCULAR | Status: AC
  Administered 2023-04-28: 300 ug via INTRAMUSCULAR
  Filled 2023-04-28 (×2): qty 2

## 2023-04-28 MED ORDER — SOD CITRATE-CITRIC ACID 500-334 MG/5ML PO SOLN: 30.0000 mL | ORAL | Status: DC | PRN

## 2023-04-28 MED ORDER — OXYTOCIN-SODIUM CHLORIDE 30-0.9 UT/500ML-% IV SOLN
2.5000 [IU]/h | INTRAVENOUS | Status: DC
  Filled 2023-04-28 (×2): qty 500

## 2023-04-28 MED ORDER — OXYTOCIN-SODIUM CHLORIDE 30-0.9 UT/500ML-% IV SOLN: 1.0000 m[IU]/min | INTRAVENOUS | Status: DC

## 2023-04-28 NOTE — Progress Notes (Signed)
Patient information  Patient Name: Emma Page  Patient MRN:   [id number]  Referring practice: [facility]FM Referring Provider: [place] [facility]  MFM CONSULT  LOUISE RAWSON is a 37 y.o. [id number] at [redacted]w[redacted]d here for ultrasound and consultation.   The patient was seen today in clinic for an ultrasound due to fetal growth restriction.  She was diagnosed with early onset severe fetal growth restriction with absent end-diastolic flow around 22 weeks.  At that time we discussed that the fetus was too small and early in the development to attempt a trial of resuscitation even if delivery occurred.  At that time we discussed the high rate of stillbirth but the need to monitor closely.  Unfortunately she missed her subsequent follow-up ultrasound.  She was last seen in clinic on 04/26/2023 where she was sent to the MAU for elevated blood pressures.  Fetal heart tones were obtained in the clinic at that time.  I do not see in the EMR where she went to the MAU for evaluation.  She then missed her appointment on 7/16.  She followed up today and unfortunately has a fetal demise.  She is also been complaining of headaches and elevated blood pressure.  I discussed that due to concern for preeclampsia in the setting of a fetal demise I recommend she go to labor and delivery for direct admission for delivery.  She has a prior cesarean delivery but since there is no fetal benefit at this point induction of labor is preferable.  The patient reports that she denies vaginal bleeding, contractions, increased pelvic pressure or loss of fluid.  She reports normal fetal movement even today.  We discussed that this likely was not reflective of fetal movement and was mostly gas or another intra-abdominal process.   Sonographic findings Single intrauterine pregnancy. Fetal cardiac activity:  Absent. Presentation: Breech. Interval fetal anatomy appears abnormal with a collapsing fetus, hydrops and oligohydramnios  suggestive of a fetal demise that occurred at least 1 to 2 weeks prior.  Due to the lack of fluid and collapsed fetal body it is difficult to discern specific anatomic structures. Fetal biometry shows the estimated fetal weight at the < 1 percentile and the abdominal circumference at the <1 percentile.  Amniotic fluid volume: Oligohydramnios. MVP: 2.54 cm. Placenta: Anterior.  Assessment Fetal demise, greater than 22 weeks, antepartum, single or unspecified fetus  Chronic hypertension during pregnancy, antepartum  Suspected early onset fetal growth restriction  Abnormal fetal ultrasound  Plan -Sent to L&D for induction of labor due to fetal demise -Preeclampsia workup and treatment  -Antiphospholipid labs should be done if not previously collected -Patient is open to genetic testing on the fetus.  Please discussed the time of induction of labor.  Review of Systems: A review of systems was performed and was negative except per HPI   Vitals and Physical Exam    04/28/2023   12:31 PM 04/06/2023    5:04 PM 04/06/2023    4:36 PM  Vitals with BMI  Weight   222 lbs  Systolic 143 152 782  Diastolic 90 103 94  Pulse 80  92  Sitting comfortably on the sonogram table Nonlabored breathing Normal rate and rhythm Abdomen is nontender  Past pregnancies OB History  Gravida Para Term Preterm AB Living  17 4 3 1 12 5   SAB IAB Ectopic Multiple Live Births  7 5 0 1 5    # Outcome Date GA Lbr Len/2nd Weight Sex Type Anes PTL Lv  17 Current           16A Preterm 05/27/18 [redacted]w[redacted]d  1.78 kg F CS-LTranv Spinal  LIV  16B Preterm 05/27/18 [redacted]w[redacted]d  1.03 kg F CS-LTranv Spinal  LIV  15 Term 07/04/05    F Vag-Spont   LIV  14 Term 11/09/02    M Vag-Spont   LIV  13 Term 12/24/00    F Vag-Spont   LIV  12 SAB           11 SAB           10 SAB           9 SAB           8 IAB           7 IAB           6 IAB           5 IAB           4 IAB           3 SAB           2 SAB           1 SAB              I spent 45 minutes reviewing the patients chart, including labs and images as well as counseling the patient about her medical conditions. Greater than 50% of the time was spent in direct face-to-face patient counseling.  Braxton Feathers  MFM, [facility]   04/28/2023  1:48 PM

## 2023-04-28 NOTE — Progress Notes (Addendum)
Patient ID: Emma Page, female   DOB: 03/19/1986, 37 y.o.   MRN: [id number]  In to introduce myself to the pt and family; she is appropriately grieving; Pitocin was started at 1830 and she is feeling some tightening; s/p Lab 20mg  IV at 1955 for BPs meeting criteria  BPs 157/83, 156/98, 162/97 Cx deferred  IUFD @ 28.6wks Prev C/S Rh neg cHTN w SIPE IOL process  Home dose of Labetalol 200mg  tid increased to 300mg  tid S/p Rhogam at 2145 Would like to rest- hydroxyzine given Cervical foley discussed and will possibly be attempted later on  [name] [facility] 04/28/2023

## 2023-04-28 NOTE — H&P (Signed)
OBSTETRIC ADMISSION HISTORY AND PHYSICAL  Emma Page is a 37 y.o. female [id number] with IUP at [redacted]w[redacted]d by 11 wk [place] presenting for IUFD diagnosed in MFM office today. She reports No LOF, no VB, no blurry vision, headaches.   She received her prenatal care at  [facility]    Dating: By 11 wk [place] --->  Estimated Date of Delivery: 07/15/23  Sono:    @[redacted]w[redacted]d , absent cardiac activity, FGR, normal anatomy, breech presentation, anterior placenta , 467g, <1% EFW   Prenatal History/Complications:  -IUFD -FGR -cHTN -Hx of C/S -Hx of preE -grand multip  Past Medical History: Past Medical History:  Diagnosis Date   Abnormal fetal ultrasound 04/01/2018   Anemia in pregnancy 01/11/2018   Asthma    Chlamydia    Gonorrhea    History of asthma 01/03/2018   Hypertension    Infection    UTI   Pregnancy induced hypertension    Rubella non-immune status, antepartum 01/11/2018   Trichomonas infection    Vaginal Pap smear, abnormal    bx x3   Vitamin D deficiency 01/11/2018    Past Surgical History: Past Surgical History:  Procedure Laterality Date   abortion     CESAREAN SECTION MULTI-GESTATIONAL N/A 05/27/2018   Procedure: CESAREAN SECTION MULTI-GESTATIONAL;  Surgeon: Lesly Dukes, MD;  Location: [facility];  Service: Obstetrics;  Laterality: N/A;   DILATION AND CURETTAGE OF UTERUS     DILATION AND EVACUATION N/A 08/19/2017   Procedure: DILATATION AND EVACUATION;  Surgeon: Willodean Rosenthal, MD;  Location: [facility];  Service: Gynecology;  Laterality: N/A;   EYE SURGERY Right    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    Obstetrical History: OB History     Gravida  17   Para  4   Term  3   Preterm  1   AB  12   Living  5      SAB  7   IAB  5   Ectopic  0   Multiple  1   Live Births  5           Social History Social History   Socioeconomic History   Marital status: Single    Spouse name: Not on file   Number of children: Not on file   Years  of education: Not on file   Highest education level: Not on file  Occupational History   Not on file  Tobacco Use   Smoking status: Never   Smokeless tobacco: Never  Vaping Use   Vaping status: Never Used  Substance and Sexual Activity   Alcohol use: No    Comment: occasional-socially, 3-4 week; not while pregnant   Drug use: No   Sexual activity: Yes    Birth control/protection: None  Other Topics Concern   Not on file  Social History Narrative   Not on file   Social Determinants of Health   Financial Resource Strain: Low Risk  (05/27/2018)   Overall Financial Resource Strain (CARDIA)    Difficulty of Paying Living Expenses: Not hard at all  Food Insecurity: No Food Insecurity (04/28/2023)   Hunger Vital Sign    Worried About Running Out of Food in the Last Year: Never true    Ran Out of Food in the Last Year: Never true  Transportation Needs: No Transportation Needs (04/28/2023)   PRAPARE - Administrator, Civil Service (Medical): No    Lack of Transportation (Non-Medical): No  Physical Activity: Not on  file  Stress: Not on file  Social Connections: Not on file    Family History: Family History  Problem Relation Age of Onset   Diabetes Paternal Grandfather    Cancer Paternal Grandmother    Cancer Maternal Grandmother    Heart disease Maternal Grandmother    Hypertension Father    Anesthesia problems Neg Hx     Allergies: Allergies  Allergen Reactions   Lisinopril Swelling    Medications Prior to Admission  Medication Sig Dispense Refill Last Dose   aspirin 81 MG chewable tablet Chew 1 tablet (81 mg total) by mouth daily. 30 tablet 8 04/28/2023   labetalol (NORMODYNE) 200 MG tablet Take 1 tablet (200 mg total) by mouth 3 (three) times daily. 90 tablet 11 04/28/2023   cetirizine (ZYRTEC ALLERGY) 10 MG tablet Take 1 tablet (10 mg total) by mouth daily. (Patient not taking: Reported on 04/28/2023) 30 tablet 11    famotidine (PEPCID) 20 MG tablet Take 1  tablet (20 mg total) by mouth daily. May increase to twice daily if needed. (Patient not taking: Reported on 04/28/2023) 60 tablet 2    Prenatal Vit-Fe Fumarate-FA (PREPLUS) 27-1 MG TABS Take 1 tablet by mouth daily. 90 tablet 3    PROAIR HFA 108 (90 Base) MCG/ACT inhaler INHALE 2 PUFFS INTO THE LUNGS EVERY 6 HOURS AS NEEDED FOR WHEEZING OR SHORTNESS OF BREATH 8.5 g 5    Vitamin D, Ergocalciferol, (DRISDOL) 1.25 MG (50000 UNIT) CAPS capsule Take 50,000 Units by mouth once a week. (Patient not taking: Reported on 04/28/2023)      Review of Systems   All systems reviewed and negative except as stated in HPI  Last menstrual period 10/16/2022, unknown if currently breastfeeding. General appearance: alert and no distress Lungs: normal effort Heart: BP mild elevated Abdomen: gravid Extremities: No LE edema Presentation: breech per MFM [place] 7/24  Dilation: Closed Effacement (%): Thick Station: -3 Exam by:: Dr. Salvadore Dom  Prenatal labs: ABO, Rh: --/--/PENDING (07/24 1557) Antibody: PENDING (07/24 1557) Rubella: <0.90 (04/16 1706) RPR: Non Reactive (04/16 1706)  HBsAg: Negative (04/16 1706)  HIV: Non Reactive (04/16 1706)    Results for orders placed or performed during the hospital encounter of 04/28/23 (from the past 24 hour(s))  Type and screen [facility]   Collection Time: 04/28/23  3:57 PM  Result Value Ref Range   ABO/RH(D) PENDING    Antibody Screen PENDING    Sample Expiration      05/01/2023,2359 Performed at [facility], [address] [place], [place] [address]   CBC   Collection Time: 04/28/23  3:58 PM  Result Value Ref Range   WBC 11.4 (H) 4.0 - 10.5 K/uL   RBC 3.54 (L) 3.87 - 5.11 MIL/uL   Hemoglobin 10.7 (L) 12.0 - 15.0 g/dL   HCT 10.2 (L) 72.5 - 36.6 %   MCV 88.7 80.0 - 100.0 fL   MCH 30.2 26.0 - 34.0 pg   MCHC 34.1 30.0 - 36.0 g/dL   RDW 44.0 34.7 - 42.5 %   Platelets 373 150 - 400 K/uL   nRBC 0.0 0.0 - 0.2 %    Patient Active  Problem List   Diagnosis Date Noted   IUFD at 20 weeks or more of gestation 04/28/2023   Suspected early onset fetal growth restriction 02/22/2023   History of cesarean delivery 01/19/2023   Hx of preeclampsia, prior pregnancy, currently pregnant 01/19/2023   Supervision of high risk pregnancy, antepartum 12/29/2022  Grand multiparity 01/03/2018   Chronic hypertension during pregnancy, antepartum 12/14/2017    Assessment/Plan:  Emma Page is a 37 y.o. [id number] at [redacted]w[redacted]d here for IUFD at MFM visit this afternoon. Hx of FGR and cHTN with elevated BP today. Offered my condolences and offered she and her partner time to process. She declined more time to process this loss prior to proceeding with induction.   #Labor: Briefly discussed FB. She is closed. The patient is worried about labor pain and unsure if she would like an epidural. We discussed avoiding cytotec given her hx of C/S. We will start with high dose pitocin and attempt FB at her next exam.  #Pain: Maternal support. Discussed early epidural, she is unsure of this.  #MOC:BTL? Would clarify at some point given the current situation  cHTN -Continue home labetalol -preE labs pending -BP protocol  IUFD -Does want Anora -DIC panel -antiphospholipid pending  Emma Levels Autry-Lott, DO  04/28/2023, 4:57 PM

## 2023-04-29 ENCOUNTER — Encounter (HOSPITAL_COMMUNITY): Payer: Self-pay | Admitting: Obstetrics and Gynecology

## 2023-04-29 LAB — COMPREHENSIVE METABOLIC PANEL
ALT: 22 U/L (ref 0–44)
AST: 22 U/L (ref 15–41)
Albumin: 2.6 g/dL — ABNORMAL LOW (ref 3.5–5.0)
Alkaline Phosphatase: 83 U/L (ref 38–126)
Anion gap: 13 (ref 5–15)
BUN: 8 mg/dL (ref 6–20)
CO2: 20 mmol/L — ABNORMAL LOW (ref 22–32)
Calcium: 8.7 mg/dL — ABNORMAL LOW (ref 8.9–10.3)
Chloride: 100 mmol/L (ref 98–111)
Creatinine, Ser: 0.82 mg/dL (ref 0.44–1.00)
GFR, Estimated: >60 mL/min (ref >60)
Glucose, Bld: 117 mg/dL — ABNORMAL HIGH (ref 70–99)
Potassium: 3.4 mmol/L — ABNORMAL LOW (ref 3.5–5.1)
Sodium: 133 mmol/L — ABNORMAL LOW (ref 135–145)
Total Bilirubin: 0.8 mg/dL (ref 0.3–1.2)
Total Protein: 6.1 g/dL — ABNORMAL LOW (ref 6.5–8.1)

## 2023-04-29 LAB — CBC
HCT: 29.2 % — ABNORMAL LOW (ref 36.0–46.0)
Hemoglobin: 9.8 g/dL — ABNORMAL LOW (ref 12.0–15.0)
MCH: 29.1 pg (ref 26.0–34.0)
MCHC: 33.6 g/dL (ref 30.0–36.0)
MCV: 86.6 fL (ref 80.0–100.0)
Platelets: 348 10*3/uL (ref 150–400)
RBC: 3.37 MIL/uL — ABNORMAL LOW (ref 3.87–5.11)
RDW: 14.1 % (ref 11.5–15.5)
WBC: 12.8 10*3/uL — ABNORMAL HIGH (ref 4.0–10.5)
nRBC: 0 % (ref 0.0–0.2)

## 2023-04-29 LAB — RH IG WORKUP (INCLUDES ABO/RH): Unit division: 0

## 2023-04-29 LAB — ANTIPHOSPHOLIPID SYNDROME EVAL, BLD
Anticardiolipin IgM: <9 MPL U/mL (ref 0–12)
DRVVT: 29.6 s (ref 0.0–47.0)
PTT Lupus Anticoagulant: 29.3 s (ref 0.0–43.5)
Phosphatydalserine, IgA: 1 APS Units (ref 0–19)
Phosphatydalserine, IgG: <9 Units (ref 0–30)

## 2023-04-29 LAB — RPR: RPR Ser Ql: NONREACTIVE

## 2023-04-29 MED ORDER — MISOPROSTOL 25 MCG QUARTER TABLET
25.0000 ug | ORAL_TABLET | ORAL | Status: DC
  Administered 2023-04-29 – 2023-04-30 (×5): 25 ug via ORAL
  Filled 2023-04-29 (×4): qty 1

## 2023-04-29 MED ORDER — HYDRALAZINE HCL 20 MG/ML IJ SOLN: 10.0000 mg | INTRAMUSCULAR | Status: DC | PRN

## 2023-04-29 MED ORDER — HYDRALAZINE HCL 20 MG/ML IJ SOLN
5.0000 mg | INTRAMUSCULAR | Status: DC | PRN
  Administered 2023-04-29: 5 mg via INTRAVENOUS

## 2023-04-29 MED ORDER — LABETALOL HCL 5 MG/ML IV SOLN: 20.0000 mg | INTRAVENOUS | Status: DC | PRN

## 2023-04-29 MED ORDER — MISOPROSTOL 25 MCG QUARTER TABLET
25.0000 ug | ORAL_TABLET | ORAL | Status: DC
  Filled 2023-04-29: qty 1

## 2023-04-29 MED ORDER — MISOPROSTOL 25 MCG QUARTER TABLET
25.0000 ug | ORAL_TABLET | ORAL | Status: DC
  Administered 2023-04-29 – 2023-04-30 (×4): 25 ug via VAGINAL
  Filled 2023-04-29 (×3): qty 1

## 2023-04-29 MED ORDER — MISOPROSTOL 25 MCG QUARTER TABLET
25.0000 ug | ORAL_TABLET | Freq: Once | ORAL | Status: AC
  Administered 2023-04-30: 25 ug via BUCCAL
  Filled 2023-04-29: qty 1

## 2023-04-29 MED ORDER — NIFEDIPINE ER OSMOTIC RELEASE 60 MG PO TB24
60.0000 mg | ORAL_TABLET | Freq: Every day | ORAL | Status: DC
  Administered 2023-04-29 – 2023-05-01 (×3): 60 mg via ORAL
  Filled 2023-04-29: qty 2
  Filled 2023-04-29: qty 1
  Filled 2023-04-29: qty 2

## 2023-04-29 MED ORDER — LABETALOL HCL 5 MG/ML IV SOLN: 40.0000 mg | INTRAVENOUS | Status: DC | PRN

## 2023-04-29 NOTE — Progress Notes (Signed)
Patient ID: Emma Page, female   DOB: 01-30-1986, 37 y.o.   MRN: [id number]  Feeling a little crampy but okay overall; asymptomatic for pre-e; has rec'd IV either Lab or hydral protocol x 3 since admission with current oral order Labetalol 300mg  tid  BP 158/97 Ctx irreg 3-6 mins with Pit @ 58mu/min Cx 1/thick/-3  IUFD @ 28.6wks Prev C/S Uncontrolled cHTN w SIPE  Unable to insert either Cook's or silicone cervical foley Will add in Procardia XL 60mg  now and discuss at sign out oral antihypertensive plan Repeat CBC/CMET now Continue Pitocin  [name] [facility] 04/29/2023 6:02 AM

## 2023-04-29 NOTE — Progress Notes (Signed)
Introduced myself to family Discussed transition in the team.  Reviewed plan of care to change to cytotec to help with cervical ripening.  Reviewed the comfort care team and resources.

## 2023-04-29 NOTE — Progress Notes (Signed)
Labor Progress Note Emma Page is a 37 y.o. [id number] at [redacted]w[redacted]d presented for IUFD  S: received dual cytotec this AM.   O:  BP (!) 139/91   Pulse 94   Temp 98.1 F (36.7 C) (Oral)   Resp 18   Ht 5\' 5"  (1.651 m)   Wt 97.5 kg   LMP 10/16/2022   SpO2 97%   BMI 35.78 kg/m   CVE: Dilation: 1 Effacement (%): 50 Station: -3 Exam by:: Emma Snowball, RN  FB placed and filled with 40cc    A&P: 37 y.o. [id number] [redacted]w[redacted]d with IUFD  #Labor: Progressing well. FB placed with second dose of vaginal cytotec. DIC panel WNL.  #Pain: PRN #FWB: NA- patient declined ANORA.   #cHTN: BPs have been mild range. She is on Procardia 60 XL and we d/c the labetalol TID. Will consider additional dose of procardia if needed. CMET and CBC reviewed.   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 Lab to run titer  Federico Flake, MD 4:10 PM

## 2023-04-29 NOTE — Progress Notes (Signed)
Patient Vitals for the past 4 hrs:  BP Temp Temp src Pulse  04/29/23 2110 (!) 145/89 -- -- (!) 101  04/29/23 2010 137/81 -- -- 98  04/29/23 1930 -- 98.1 F (36.7 C) Oral --  04/29/23 1900 (!) 141/87 -- -- (!) 101  04/29/23 1844 (!) 138/93 -- -- 97   Cramping some, has had IV fentanyl.  Foley still in. Exam around it shows cx to be 1.5cm, still thick. Cytotec placed in post vaginal fornix and same dose given orally.  Will continue dual cytotec (may go bucally instead of vaginally at next dose if foley still in). Pt's questions answered, comfort given.

## 2023-04-30 ENCOUNTER — Encounter (HOSPITAL_COMMUNITY): Payer: Self-pay | Admitting: Obstetrics and Gynecology

## 2023-04-30 ENCOUNTER — Other Ambulatory Visit: Payer: Self-pay

## 2023-04-30 DIAGNOSIS — O364XX0 Maternal care for intrauterine death, not applicable or unspecified: Secondary | ICD-10-CM

## 2023-04-30 DIAGNOSIS — Z3A28 28 weeks gestation of pregnancy: Secondary | ICD-10-CM

## 2023-04-30 DIAGNOSIS — O365930 Maternal care for other known or suspected poor fetal growth, third trimester, not applicable or unspecified: Secondary | ICD-10-CM

## 2023-04-30 DIAGNOSIS — O1414 Severe pre-eclampsia complicating childbirth: Secondary | ICD-10-CM

## 2023-04-30 DIAGNOSIS — O34211 Maternal care for low transverse scar from previous cesarean delivery: Secondary | ICD-10-CM

## 2023-04-30 MED ORDER — OXYCODONE HCL 5 MG PO TABS
5.0000 mg | ORAL_TABLET | Freq: Once | ORAL | Status: AC
  Administered 2023-04-30: 5 mg via ORAL
  Filled 2023-04-30: qty 1

## 2023-04-30 MED ORDER — TETANUS-DIPHTH-ACELL PERTUSSIS 5-2.5-18.5 LF-MCG/0.5 IM SUSY: 0.5000 mL | PREFILLED_SYRINGE | Freq: Once | INTRAMUSCULAR | Status: DC

## 2023-04-30 MED ORDER — WITCH HAZEL-GLYCERIN EX PADS: 1.0000 | MEDICATED_PAD | CUTANEOUS | Status: DC | PRN

## 2023-04-30 MED ORDER — PHENYLEPHRINE 80 MCG/ML (10ML) SYRINGE FOR IV PUSH (FOR BLOOD PRESSURE SUPPORT): 80.0000 ug | PREFILLED_SYRINGE | INTRAVENOUS | Status: DC | PRN

## 2023-04-30 MED ORDER — SIMETHICONE 80 MG PO CHEW: 80.0000 mg | CHEWABLE_TABLET | ORAL | Status: DC | PRN

## 2023-04-30 MED ORDER — SENNOSIDES-DOCUSATE SODIUM 8.6-50 MG PO TABS: 2.0000 | ORAL_TABLET | Freq: Every day | ORAL | Status: DC

## 2023-04-30 MED ORDER — ONDANSETRON HCL 4 MG/2ML IJ SOLN: 4.0000 mg | INTRAMUSCULAR | Status: DC | PRN

## 2023-04-30 MED ORDER — EPHEDRINE 5 MG/ML INJ: 10.0000 mg | INTRAVENOUS | Status: DC | PRN

## 2023-04-30 MED ORDER — COCONUT OIL OIL: 1.0000 | TOPICAL_OIL | Status: DC | PRN

## 2023-04-30 MED ORDER — BENZOCAINE-MENTHOL 20-0.5 % EX AERO: 1.0000 | INHALATION_SPRAY | CUTANEOUS | Status: DC | PRN

## 2023-04-30 MED ORDER — ONDANSETRON HCL 4 MG PO TABS: 4.0000 mg | ORAL_TABLET | ORAL | Status: DC | PRN

## 2023-04-30 MED ORDER — IBUPROFEN 600 MG PO TABS
600.0000 mg | ORAL_TABLET | Freq: Once | ORAL | Status: AC
  Administered 2023-04-30: 600 mg via ORAL
  Filled 2023-04-30: qty 1

## 2023-04-30 MED ORDER — PRENATAL MULTIVITAMIN CH: 1.0000 | ORAL_TABLET | Freq: Every day | ORAL | Status: DC

## 2023-04-30 MED ORDER — DIPHENHYDRAMINE HCL 50 MG/ML IJ SOLN: 12.5000 mg | INTRAMUSCULAR | Status: DC | PRN

## 2023-04-30 MED ORDER — ZOLPIDEM TARTRATE 5 MG PO TABS: 5.0000 mg | ORAL_TABLET | Freq: Every evening | ORAL | Status: DC | PRN

## 2023-04-30 MED ORDER — FENTANYL-BUPIVACAINE-NACL 0.5-0.125-0.9 MG/250ML-% EP SOLN
12.0000 mL/h | EPIDURAL | Status: DC | PRN
  Filled 2023-04-30: qty 250

## 2023-04-30 MED ORDER — ACETAMINOPHEN 325 MG PO TABS
650.0000 mg | ORAL_TABLET | ORAL | Status: DC | PRN
  Administered 2023-04-30 – 2023-05-01 (×3): 650 mg via ORAL
  Filled 2023-04-30 (×3): qty 2

## 2023-04-30 MED ORDER — DIBUCAINE (PERIANAL) 1 % EX OINT: 1.0000 | TOPICAL_OINTMENT | CUTANEOUS | Status: DC | PRN

## 2023-04-30 MED ORDER — DIPHENHYDRAMINE HCL 25 MG PO CAPS: 25.0000 mg | ORAL_CAPSULE | Freq: Four times a day (QID) | ORAL | Status: DC | PRN

## 2023-04-30 MED ORDER — FUROSEMIDE 20 MG PO TABS
20.0000 mg | ORAL_TABLET | Freq: Every day | ORAL | Status: DC
  Administered 2023-05-01: 20 mg via ORAL
  Filled 2023-04-30: qty 1

## 2023-04-30 MED ORDER — LACTATED RINGERS IV SOLN: 500.0000 mL | Freq: Once | INTRAVENOUS | Status: DC

## 2023-04-30 MED ORDER — IBUPROFEN 600 MG PO TABS
600.0000 mg | ORAL_TABLET | Freq: Four times a day (QID) | ORAL | Status: DC
  Administered 2023-04-30 – 2023-05-01 (×3): 600 mg via ORAL
  Filled 2023-04-30 (×3): qty 1

## 2023-04-30 MED ORDER — MEASLES, MUMPS & RUBELLA VAC IJ SOLR
0.5000 mL | Freq: Once | INTRAMUSCULAR | Status: AC
  Administered 2023-05-01: 0.5 mL via SUBCUTANEOUS
  Filled 2023-04-30: qty 0.5

## 2023-04-30 MED ORDER — OXYCODONE HCL 5 MG PO TABS
5.0000 mg | ORAL_TABLET | ORAL | Status: DC | PRN
  Administered 2023-04-30: 5 mg via ORAL
  Filled 2023-04-30: qty 1

## 2023-04-30 NOTE — Discharge Summary (Signed)
Postpartum Discharge Summary  Date of Service updated: 05/01/23     Patient Name: Emma Page DOB: 1986/07/09 MRN: [id number]  Date of admission: 04/28/2023 Delivery date:04/30/2023 Delivering provider: Lennart Pall Date of discharge: 05/01/2023  Admitting diagnosis: IUFD at 20 weeks or more of gestation [O36.4XX0] Intrauterine pregnancy: [redacted]w[redacted]d     Secondary diagnosis:  Principal Problem:   IUFD at 20 weeks or more of gestation Active Problems:   Chronic hypertension with superimposed pre-eclampsia   Grand multiparity   History of cesarean delivery   Suspected early onset fetal growth restriction  Additional problems: NA    Discharge diagnosis:  IUFD delivered                                               Post partum procedures: NA Augmentation: Pitocin, Cytotec, and IP Foley Complications: [facility] course: Induction of Labor With Vaginal Delivery   37 y.o. yo [id number] at [redacted]w[redacted]d was admitted to the hospital 04/28/2023 for induction for IUFD.  Patient had an uncomplicated labor course. Membrane Rupture Time/Date: 10:49 AM,04/30/2023  Delivery Method:VBAC, Spontaneous Episiotomy: None Lacerations:  None Details of delivery can be found in separate delivery note.  Patient had a uncomplicated postpartum course . Patient is discharged home 05/01/23.  Newborn Data: Birth date:04/30/2023 Birth time:10:46 AM Gender:Female Living status:Fetal Demise Apgars: ,  Weight:455 g  Magnesium Sulfate received: No BMZ received: N/A Rhophylac:Yes MMR:Yes - ordered T-DaP:Given prenatally - ordered Flu: N/A Transfusion:Yes  Physical exam  Vitals:   04/30/23 2116 05/01/23 0050 05/01/23 0615 05/01/23 0914  BP: 122/72 117/79 116/69 130/70  Pulse: 90 86 85 (!) 101  Resp: 16 18 18 18   Temp: 98.2 F (36.8 C) 98.1 F (36.7 C) 98.8 F (37.1 C) 98.5 F (36.9 C)  TempSrc: Oral Oral Oral Oral  SpO2: 99% 99% 99% 98%  Weight:      Height:       General:  alert Lochia: appropriate Uterine Fundus: firm Incision: N/A DVT Evaluation: No evidence of DVT seen on physical exam. Labs: Lab Results  Component Value Date   WBC 10.5 04/30/2023   HGB 9.2 (L) 04/30/2023   HCT 27.6 (L) 04/30/2023   MCV 88.2 04/30/2023   PLT 332 04/30/2023   PLT 344 04/30/2023      Latest Ref Rng & Units 04/29/2023    6:26 AM  CMP  Glucose 70 - 99 mg/dL 086   BUN 6 - 20 mg/dL 8   Creatinine 5.78 - 4.69 mg/dL 6.29   Sodium 528 - 413 mmol/L 133   Potassium 3.5 - 5.1 mmol/L 3.4   Chloride 98 - 111 mmol/L 100   CO2 22 - 32 mmol/L 20   Calcium 8.9 - 10.3 mg/dL 8.7   Total Protein 6.5 - 8.1 g/dL 6.1   Total Bilirubin 0.3 - 1.2 mg/dL 0.8   Alkaline Phos 38 - 126 U/L 83   AST 15 - 41 U/L 22   ALT 0 - 44 U/L 22    Edinburgh Score:     No data to display           After visit meds:  Allergies as of 05/01/2023       Reactions   Lisinopril Swelling        Medication List     STOP taking these medications  aspirin 81 MG chewable tablet   cetirizine 10 MG tablet Commonly known as: ZyrTEC Allergy   famotidine 20 MG tablet Commonly known as: PEPCID   labetalol 200 MG tablet Commonly known as: NORMODYNE   Vitamin D (Ergocalciferol) 1.25 MG (50000 UNIT) Caps capsule Commonly known as: DRISDOL       TAKE these medications    furosemide 20 MG tablet Commonly known as: LASIX Take 1 tablet (20 mg total) by mouth daily. Start taking on: May 02, 2023   ibuprofen 600 MG tablet Commonly known as: ADVIL Take 1 tablet (600 mg total) by mouth every 6 (six) hours.   NIFEdipine 60 MG 24 hr tablet Commonly known as: ADALAT CC Take 1 tablet (60 mg total) by mouth daily. Start taking on: May 02, 2023   PrePLUS 27-1 MG Tabs Take 1 tablet by mouth daily.   ProAir HFA 108 (90 Base) MCG/ACT inhaler Generic drug: albuterol INHALE 2 PUFFS INTO THE LUNGS EVERY 6 HOURS AS NEEDED FOR WHEEZING OR SHORTNESS OF BREATH         Discharge home  in stable condition Infant Disposition:morgue Discharge instruction: per After Visit Summary and Postpartum booklet. Activity: Advance as tolerated. Pelvic rest for 6 weeks.  Diet: routine diet Future Appointments: Future Appointments  Date Time Provider Department Center  05/06/2023  2:30 PM Gwyndolyn Saxon, [place] [facility] None  05/06/2023  3:40 PM [facility] NURSE [facility] None  05/28/2023  8:15 AM Lennart Pall, MD [facility] None   Follow up Visit:  Message sent by [facility] 7/26 1251p Please schedule this patient for a In person postpartum visit in 4 weeks with the following provider: Any provider. Additional Postpartum F/U:Postpartum Depression checkup and BP check 1 week  High risk pregnancy complicated by: HTN, FGR, IUFD Delivery mode:  VBAC, Spontaneous Anticipated Birth Control:       05/01/2023 Hermina Staggers, MD

## 2023-04-30 NOTE — Progress Notes (Signed)
Patient Vitals for the past 4 hrs:  BP Temp Temp src Pulse  04/30/23 0701 (!) 148/91 -- -- 95  04/30/23 0601 (!) 134/91 98.2 F (36.8 C) Oral 95  04/30/23 0506 138/88 -- -- 89  04/30/23 0402 (!) 133/93 -- -- 95   Foley out around 0600.  Cx 4/40/ballottable.  Cramping some.  Last round of dual cytotec to thin cx a bit more, plan Pitocin.  Pt doing well.

## 2023-05-01 ENCOUNTER — Other Ambulatory Visit (HOSPITAL_COMMUNITY): Payer: Self-pay

## 2023-05-01 MED ORDER — NIFEDIPINE ER 60 MG PO TB24
60.0000 mg | ORAL_TABLET | Freq: Every day | ORAL | 1 refills | Status: DC
  Filled 2023-05-01: qty 30, 30d supply, fill #0

## 2023-05-01 MED ORDER — SERTRALINE HCL 50 MG PO TABS: 50.0000 mg | ORAL_TABLET | Freq: Every day | ORAL | 11 refills | Status: AC

## 2023-05-01 MED ORDER — HYDROXYZINE HCL 25 MG PO TABS: 25.0000 mg | ORAL_TABLET | Freq: Three times a day (TID) | ORAL | 0 refills | Status: AC | PRN

## 2023-05-01 MED ORDER — FUROSEMIDE 20 MG PO TABS
20.0000 mg | ORAL_TABLET | Freq: Every day | ORAL | 0 refills | Status: DC
  Filled 2023-05-01: qty 5, 5d supply, fill #0

## 2023-05-01 MED ORDER — IBUPROFEN 600 MG PO TABS
600.0000 mg | ORAL_TABLET | Freq: Four times a day (QID) | ORAL | 0 refills | Status: DC
  Filled 2023-05-01: qty 30, 8d supply, fill #0

## 2023-05-01 NOTE — Plan of Care (Signed)
Discharged in good condition.

## 2023-05-01 NOTE — Progress Notes (Signed)
Discharge PP care and pre-e s/s reviewed with patient, medications reviewed and patient has Lasix, Procardia, and Ibuprofen to patient from the [facility] pharmacy. Anxiety meds added by Dr. Charlotta Newton to patients listed pharmacy. PP depression related to loss discussed with patient. Patient to f/u as scheduled with her OB office. Patient verbalizes understanding.

## 2023-05-04 ENCOUNTER — Encounter: Payer: Commercial Managed Care - HMO | Admitting: Obstetrics and Gynecology

## 2023-05-06 ENCOUNTER — Ambulatory Visit: Payer: Commercial Managed Care - HMO

## 2023-05-06 ENCOUNTER — Institutional Professional Consult (permissible substitution): Payer: Self-pay | Admitting: Licensed Clinical Social Worker

## 2023-05-12 ENCOUNTER — Ambulatory Visit (INDEPENDENT_AMBULATORY_CARE_PROVIDER_SITE_OTHER): Payer: Self-pay | Admitting: Licensed Clinical Social Worker

## 2023-05-12 ENCOUNTER — Ambulatory Visit (INDEPENDENT_AMBULATORY_CARE_PROVIDER_SITE_OTHER): Payer: Commercial Managed Care - HMO

## 2023-05-12 ENCOUNTER — Other Ambulatory Visit: Payer: Self-pay | Admitting: Obstetrics & Gynecology

## 2023-05-12 VITALS — BP 133/87 | HR 114 | Ht 65.0 in | Wt 222.7 lb

## 2023-05-12 DIAGNOSIS — Z0131 Encounter for examination of blood pressure with abnormal findings: Secondary | ICD-10-CM

## 2023-05-12 DIAGNOSIS — O10919 Unspecified pre-existing hypertension complicating pregnancy, unspecified trimester: Secondary | ICD-10-CM

## 2023-05-12 DIAGNOSIS — R030 Elevated blood-pressure reading, without diagnosis of hypertension: Secondary | ICD-10-CM

## 2023-05-12 DIAGNOSIS — O364XX0 Maternal care for intrauterine death, not applicable or unspecified: Secondary | ICD-10-CM

## 2023-05-12 DIAGNOSIS — F4321 Adjustment disorder with depressed mood: Secondary | ICD-10-CM

## 2023-05-12 MED ORDER — HYDROCHLOROTHIAZIDE 25 MG PO TABS
25.0000 mg | ORAL_TABLET | Freq: Every day | ORAL | 3 refills | Status: AC
Start: 2023-05-12 — End: ?

## 2023-05-12 NOTE — Progress Notes (Addendum)
Emma Page  [id number] here for postpartum depression screen. Pt is currently 1 week and 5 days postpartum following an IOL for IUFD at [redacted]w[redacted]d. Pt reports being sad, depressed, and joyless. Pt states she has not been taking prescribed Zoloft. Pt referred to Laureen Ochs LCSW for appointment at this time.  Pt c/o leg swelling and numbness in left hand. Pt reports having a sausage biscuit from [employer] for breakfast.   Edinburgh Postnatal Depression Scale - 05/12/23 1034       Edinburgh Postnatal Depression Scale:  In the Past 7 Days   I have been able to laugh and see the funny side of things. 2    I have looked forward with enjoyment to things. 3    I have blamed myself unnecessarily when things went wrong. 3    I have been anxious or worried for no good reason. 3    I have felt scared or panicky for no good reason. 3    Things have been getting on top of me. 3    I have been so unhappy that I have had difficulty sleeping. 3    I have felt sad or miserable. 3    I have been so unhappy that I have been crying. 3    The thought of harming myself has occurred to me. --    Patient states she does not know the answer to this question.          Blood Pressures: 168/105, 155/103, and 145/95. Consulted with Dr. Debroah Loop. Dr. Debroah Loop sent Rx for Hydrochlorothiazide. 11:20am blood pressure: 133/87 Pt instructed to add Hydrochlorothiazide to medication regimen.  Pt instructed to begin taking prescribed Zoloft as directed.  Pt to follow up as needed with integrated BH. Pt scheduled for postpartum visit on 05/28/2023 at 8:15am.

## 2023-05-19 ENCOUNTER — Encounter: Payer: Commercial Managed Care - HMO | Admitting: Obstetrics and Gynecology

## 2023-05-19 NOTE — BH Specialist Note (Signed)
Integrated Behavioral Health Initial In-Person Visit  MRN: [id number] Name: Embri Fazel  Number of Integrated Behavioral Health Clinician visits: 1 Session Start time:   11:16am Session End time: 11:55am Total time in minutes: 39 mins in person at [facility]   Types of Service: Individual psychotherapy  Interpretor:No. Interpretor Name and Language: none   Warm Hand Off Completed.        Subjective: Almedia Uplinger is a 37 y.o. female accompanied by n/a Patient was referred by [facility] Discharge for pregnancy loss. Patient reports the following symptoms/concerns: depressed mood, feeling overwhelm, financial strain Duration of problem: approx 2 weeks; Severity of problem: mild  Objective: Mood: Depressed and Affect: Appropriate Risk of harm to self or others: No plan to harm self or others  Life Context: Family and Social: Lives with partner and children in [place]  School/Work: Hair Stylist  Self-Care: none Life Changes: pregnancy loss   Patient and/or Family's Strengths/Protective Factors: Concrete supports in place (healthy food, safe environments, etc.)  Goals Addressed: Patient will: Reduce symptoms of: depression Increase knowledge and/or ability of: coping skills  Demonstrate ability to: Increase healthy adjustment to current life circumstances  Progress towards Goals: Ongoing  Interventions: Interventions utilized: Motivational Interviewing and Supportive Counseling  Standardized Assessments completed: phq9  Patient and/or Family Response: Ms. Liljedahl reports depressed mood, and financial stress due to being out of work.    Assessment: Patient currently experiencing depression.   Patient may benefit from integrated behavioral health.  Plan: Follow up with behavioral health clinician on : 2-3 weeks Behavioral recommendations: reduce stress, communicate needs with partner for added support  Referral(s): Integrated [employer] (In Clinic) "From scale of 1-10, how likely are you to follow plan?":    Gwyndolyn Saxon, LCSW

## 2023-05-27 ENCOUNTER — Telehealth: Payer: Self-pay | Admitting: Licensed Clinical Social Worker

## 2023-05-27 NOTE — Telephone Encounter (Signed)
Pt called in requesting 05/28/23 appt cancelled. Pt offered another appt 06/20/23 at 2:50pm pt reported can't do that date either because her kids are in school. Pt ended called without rescheduling

## 2023-05-28 ENCOUNTER — Ambulatory Visit: Payer: Commercial Managed Care - HMO | Admitting: Obstetrics and Gynecology

## 2023-06-02 ENCOUNTER — Encounter: Payer: Commercial Managed Care - HMO | Admitting: Obstetrics and Gynecology

## 2023-08-30 ENCOUNTER — Other Ambulatory Visit: Payer: Self-pay | Admitting: Obstetrics & Gynecology

## 2023-08-30 DIAGNOSIS — O10919 Unspecified pre-existing hypertension complicating pregnancy, unspecified trimester: Secondary | ICD-10-CM

## 2024-04-03 ENCOUNTER — Encounter: Payer: Self-pay | Admitting: Family Medicine

## 2024-04-03 ENCOUNTER — Ambulatory Visit (INDEPENDENT_AMBULATORY_CARE_PROVIDER_SITE_OTHER): Admitting: Family Medicine

## 2024-04-03 ENCOUNTER — Other Ambulatory Visit (HOSPITAL_COMMUNITY)
Admission: RE | Admit: 2024-04-03 | Discharge: 2024-04-03 | Disposition: A | Discharge status: Home / Self Care | Source: Ambulatory Visit | Attending: Family Medicine | Admitting: Family Medicine

## 2024-04-03 VITALS — BP 113/75 | HR 69 | Ht 65.0 in | Wt 186.4 lb

## 2024-04-03 DIAGNOSIS — F411 Generalized anxiety disorder: Secondary | ICD-10-CM

## 2024-04-03 DIAGNOSIS — Z113 Encounter for screening for infections with a predominantly sexual mode of transmission: Secondary | ICD-10-CM | POA: Insufficient documentation

## 2024-04-03 DIAGNOSIS — Z803 Family history of malignant neoplasm of breast: Secondary | ICD-10-CM

## 2024-04-03 MED ORDER — HYDROXYZINE HCL 25 MG PO TABS
25.0000 mg | ORAL_TABLET | Freq: Four times a day (QID) | ORAL | 0 refills | Status: AC | PRN
Start: 2024-04-03 — End: ?

## 2024-04-03 NOTE — Progress Notes (Signed)
 ANNUAL EXAM Patient name: Emma Page MRN [id number]  Date of birth: 04-01-1986 Chief Complaint:   Gynecologic Exam  History of Present Illness:   Emma Page is a 38 y.o. [id number] female being seen today for a routine annual exam.  Current complaints:   STI testing: Recently found out partner was not faithful. Requesting STI testing, ok with bloodwork.  Reproductive life planning: Plans on abstinence as method of reproductive life planning. Pregnancy not planned but would be welcome if it does happen in next year.  The pregnancy intention screening data noted above was reviewed. Potential methods of contraception were discussed. The patient elected to proceed with No data recorded.   Last pap 01/2023. Results were: NILM w/ HRHPV negative. H/O abnormal pap: no Last mammogram: Never. Results were: N/A. Family h/o breast cancer: yes - sister in remission for two years now Last colonoscopy: Never. Results were: N/A. Family h/o colorectal cancer: no     04/03/2024    3:18 PM 12/29/2022    8:42 AM  Depression screen PHQ 2/9  Decreased Interest 3 3  Down, Depressed, Hopeless 3 1  PHQ - 2 Score 6 4  Altered sleeping 3 0  Tired, decreased energy 2 3  Change in appetite 3 1  Feeling bad or failure about yourself  2 1  Trouble concentrating 2 3  Moving slowly or fidgety/restless 0 0  Suicidal thoughts 0 0  PHQ-9 Score 18 12        04/03/2024    3:18 PM 12/29/2022    8:47 AM  GAD 7 : Generalized Anxiety Score  Nervous, Anxious, on Edge 3 1  Control/stop worrying 3 3  Worry too much - different things 3 3  Trouble relaxing 3 2  Restless 2 0  Easily annoyed or irritable 3 3  Afraid - awful might happen 3 0  Total GAD 7 Score 20 12     Review of Systems:   Pertinent items are noted in HPI Denies any headaches, blurred vision, fatigue, shortness of breath, chest pain, abdominal pain, abnormal vaginal discharge/itching/odor/irritation, problems with periods,  bowel movements, urination, or intercourse unless otherwise stated above. Pertinent History Reviewed:  Reviewed past medical,surgical, social and family history.  Reviewed problem list, medications and allergies. Physical Assessment:   Vitals:   04/03/24 1512  BP: 113/75  Pulse: 69  Weight: 186 lb 6.4 oz (84.6 kg)  Height: 5' 5 (1.651 m)  Body mass index is 31.02 kg/m.        Physical Examination:   General appearance - well appearing, and in no distress  Mental status - alert, oriented to person, place, and time  Psych:  She has a normal mood and affect  Skin - warm and dry, normal color, no suspicious lesions noted  Chest - effort normal, all lung fields clear to auscultation bilaterally  Heart - normal rate and regular rhythm  Neck:  midline trachea, no thyromegaly or nodules  Abdomen - soft, nontender, nondistended, no masses or organomegaly  Pelvic - deferred  Extremities:  No swelling or varicosities noted   No results found for this or any previous visit (from the past 24 hours).  Assessment & Plan:  1) Well-Woman Exam: Up to date on pap, exam deferred  2) STI testing: Orders placed  3) Reproductive life planning: Abstinence  4) Fhx breast cancer: Sister with breast cancer, in remission  open to mammogram order  Mammogram: schedule screening mammo as soon as possible, or sooner if problems  Colonoscopy: @ 38yo, or sooner if problems  Orders Placed This Encounter  Procedures   MM 3D SCREENING MAMMOGRAM BILATERAL BREAST   HIV antibody (with reflex)   RPR   Hepatitis C Antibody   Hepatitis B Surface AntiGEN   Ambulatory referral to Integrated Behavioral Health    Meds:  Meds ordered this encounter  Medications   hydrOXYzine  (ATARAX ) 25 MG tablet    Sig: Take 1 tablet (25 mg total) by mouth every 6 (six) hours as needed for anxiety.    Dispense:  30 tablet    Refill:  0    Follow-up: Return in about 1 year (around 04/03/2025) for Annual  visit.  Alain Sor, MD 04/03/2024 3:55 PM

## 2024-04-03 NOTE — Progress Notes (Signed)
 Pt presents for AEX Last PAP 01-19-2023 Declines PAP today  Requesting STD testing and refill of hydroxyzine .

## 2024-04-04 LAB — RPR: RPR Ser Ql: NONREACTIVE

## 2024-04-04 LAB — HEPATITIS B SURFACE ANTIGEN: Hepatitis B Surface Ag: NEGATIVE

## 2024-04-04 LAB — HIV ANTIBODY (ROUTINE TESTING W REFLEX): HIV Screen 4th Generation wRfx: NONREACTIVE

## 2024-04-04 LAB — HEPATITIS C ANTIBODY: Hep C Virus Ab: NONREACTIVE

## 2024-04-05 ENCOUNTER — Ambulatory Visit: Payer: Self-pay | Admitting: Family Medicine

## 2024-04-05 DIAGNOSIS — A599 Trichomoniasis, unspecified: Secondary | ICD-10-CM

## 2024-04-05 LAB — CERVICOVAGINAL ANCILLARY ONLY
Chlamydia: NEGATIVE
Comment: NEGATIVE
Comment: NEGATIVE
Comment: NORMAL
Neisseria Gonorrhea: NEGATIVE
Trichomonas: POSITIVE — AB

## 2024-04-05 MED ORDER — TINIDAZOLE 500 MG PO TABS
2.0000 g | ORAL_TABLET | Freq: Once | ORAL | 0 refills | Status: AC
Start: 2024-04-05 — End: 2024-04-05

## 2024-04-10 ENCOUNTER — Other Ambulatory Visit: Payer: Self-pay

## 2024-04-10 DIAGNOSIS — J45909 Unspecified asthma, uncomplicated: Secondary | ICD-10-CM | POA: Diagnosis not present

## 2024-04-10 DIAGNOSIS — M545 Low back pain, unspecified: Secondary | ICD-10-CM | POA: Diagnosis not present

## 2024-04-10 DIAGNOSIS — I10 Essential (primary) hypertension: Secondary | ICD-10-CM | POA: Diagnosis not present

## 2024-04-10 DIAGNOSIS — E669 Obesity, unspecified: Secondary | ICD-10-CM | POA: Diagnosis not present

## 2024-04-10 DIAGNOSIS — Z6829 Body mass index (BMI) 29.0-29.9, adult: Secondary | ICD-10-CM | POA: Diagnosis not present

## 2024-04-10 DIAGNOSIS — G5601 Carpal tunnel syndrome, right upper limb: Secondary | ICD-10-CM | POA: Diagnosis not present

## 2024-04-10 DIAGNOSIS — F419 Anxiety disorder, unspecified: Secondary | ICD-10-CM | POA: Diagnosis not present

## 2024-04-10 DIAGNOSIS — F3341 Major depressive disorder, recurrent, in partial remission: Secondary | ICD-10-CM | POA: Diagnosis not present

## 2024-04-10 MED ORDER — FLUCONAZOLE 150 MG PO TABS: 150.0000 mg | ORAL_TABLET | Freq: Once | ORAL | 0 refills | Status: AC

## 2024-05-08 ENCOUNTER — Other Ambulatory Visit: Payer: Self-pay

## 2024-05-29 ENCOUNTER — Other Ambulatory Visit: Payer: Self-pay | Admitting: Obstetrics and Gynecology

## 2024-05-29 ENCOUNTER — Other Ambulatory Visit: Payer: Self-pay

## 2024-05-29 ENCOUNTER — Other Ambulatory Visit: Payer: Self-pay | Admitting: Family Medicine

## 2024-05-29 DIAGNOSIS — A599 Trichomoniasis, unspecified: Secondary | ICD-10-CM

## 2024-05-29 MED ORDER — FLUCONAZOLE 150 MG PO TABS: 150.0000 mg | ORAL_TABLET | Freq: Once | ORAL | 0 refills | Status: AC

## 2024-08-06 ENCOUNTER — Other Ambulatory Visit: Payer: Self-pay | Admitting: Obstetrics and Gynecology

## 2024-10-10 ENCOUNTER — Ambulatory Visit: Payer: Self-pay

## 2024-10-16 ENCOUNTER — Ambulatory Visit: Payer: Self-pay
